# Patient Record
Sex: Female | Born: 1945 | Race: White | Hispanic: No | Smoking: Former smoker
Health system: Southern US, Community
[De-identification: ages and names within clinical notes are randomized; demographics above are authoritative.]

## PROBLEM LIST (undated history)

## (undated) DIAGNOSIS — E119 Type 2 diabetes mellitus without complications: Secondary | ICD-10-CM

## (undated) DIAGNOSIS — E785 Hyperlipidemia, unspecified: Secondary | ICD-10-CM

## (undated) DIAGNOSIS — I2510 Atherosclerotic heart disease of native coronary artery without angina pectoris: Secondary | ICD-10-CM

## (undated) DIAGNOSIS — E079 Disorder of thyroid, unspecified: Secondary | ICD-10-CM

## (undated) HISTORY — PX: TONSILLECTOMY: SUR1361

## (undated) HISTORY — PX: APPENDECTOMY: SHX54

## (undated) HISTORY — PX: ABDOMINAL HYSTERECTOMY: SHX81

## (undated) HISTORY — DX: Atherosclerotic heart disease of native coronary artery without angina pectoris: I25.10

---

## 1991-04-02 DIAGNOSIS — K859 Acute pancreatitis without necrosis or infection, unspecified: Secondary | ICD-10-CM | POA: Insufficient documentation

## 1998-09-02 DIAGNOSIS — D126 Benign neoplasm of colon, unspecified: Secondary | ICD-10-CM | POA: Insufficient documentation

## 2001-06-30 DIAGNOSIS — B029 Zoster without complications: Secondary | ICD-10-CM | POA: Insufficient documentation

## 2006-10-13 DIAGNOSIS — M948X9 Other specified disorders of cartilage, unspecified sites: Secondary | ICD-10-CM | POA: Insufficient documentation

## 2006-10-20 DIAGNOSIS — E559 Vitamin D deficiency, unspecified: Secondary | ICD-10-CM | POA: Diagnosis present

## 2007-10-03 DIAGNOSIS — E1169 Type 2 diabetes mellitus with other specified complication: Secondary | ICD-10-CM | POA: Insufficient documentation

## 2007-10-03 DIAGNOSIS — E781 Pure hyperglyceridemia: Secondary | ICD-10-CM | POA: Insufficient documentation

## 2013-06-08 ENCOUNTER — Encounter (INDEPENDENT_AMBULATORY_CARE_PROVIDER_SITE_OTHER): Payer: Commercial Managed Care - HMO | Admitting: Ophthalmology

## 2013-06-27 ENCOUNTER — Encounter (INDEPENDENT_AMBULATORY_CARE_PROVIDER_SITE_OTHER): Payer: Commercial Managed Care - HMO | Admitting: Ophthalmology

## 2013-06-27 ENCOUNTER — Ambulatory Visit: Payer: Commercial Managed Care - HMO | Admitting: Nurse Practitioner

## 2013-06-27 DIAGNOSIS — E11319 Type 2 diabetes mellitus with unspecified diabetic retinopathy without macular edema: Secondary | ICD-10-CM

## 2013-06-27 DIAGNOSIS — E11311 Type 2 diabetes mellitus with unspecified diabetic retinopathy with macular edema: Secondary | ICD-10-CM

## 2013-06-27 DIAGNOSIS — E1139 Type 2 diabetes mellitus with other diabetic ophthalmic complication: Secondary | ICD-10-CM

## 2013-06-27 DIAGNOSIS — H43819 Vitreous degeneration, unspecified eye: Secondary | ICD-10-CM

## 2013-06-27 DIAGNOSIS — E1165 Type 2 diabetes mellitus with hyperglycemia: Secondary | ICD-10-CM

## 2013-08-01 ENCOUNTER — Encounter (INDEPENDENT_AMBULATORY_CARE_PROVIDER_SITE_OTHER): Payer: Medicare HMO | Admitting: Ophthalmology

## 2013-08-01 DIAGNOSIS — E11311 Type 2 diabetes mellitus with unspecified diabetic retinopathy with macular edema: Secondary | ICD-10-CM

## 2013-08-01 DIAGNOSIS — E11319 Type 2 diabetes mellitus with unspecified diabetic retinopathy without macular edema: Secondary | ICD-10-CM

## 2013-08-01 DIAGNOSIS — E1165 Type 2 diabetes mellitus with hyperglycemia: Secondary | ICD-10-CM

## 2013-08-01 DIAGNOSIS — E1139 Type 2 diabetes mellitus with other diabetic ophthalmic complication: Secondary | ICD-10-CM

## 2013-08-01 DIAGNOSIS — H43819 Vitreous degeneration, unspecified eye: Secondary | ICD-10-CM

## 2013-08-09 ENCOUNTER — Other Ambulatory Visit (INDEPENDENT_AMBULATORY_CARE_PROVIDER_SITE_OTHER): Payer: Medicare HMO | Admitting: Ophthalmology

## 2013-08-09 DIAGNOSIS — H3581 Retinal edema: Secondary | ICD-10-CM

## 2013-08-09 DIAGNOSIS — E1139 Type 2 diabetes mellitus with other diabetic ophthalmic complication: Secondary | ICD-10-CM

## 2013-08-09 DIAGNOSIS — E1165 Type 2 diabetes mellitus with hyperglycemia: Secondary | ICD-10-CM

## 2013-08-22 ENCOUNTER — Encounter (INDEPENDENT_AMBULATORY_CARE_PROVIDER_SITE_OTHER): Payer: Medicare HMO | Admitting: Ophthalmology

## 2013-08-22 DIAGNOSIS — E1165 Type 2 diabetes mellitus with hyperglycemia: Secondary | ICD-10-CM

## 2013-08-22 DIAGNOSIS — H3581 Retinal edema: Secondary | ICD-10-CM

## 2013-08-22 DIAGNOSIS — E1139 Type 2 diabetes mellitus with other diabetic ophthalmic complication: Secondary | ICD-10-CM

## 2013-12-24 ENCOUNTER — Ambulatory Visit (INDEPENDENT_AMBULATORY_CARE_PROVIDER_SITE_OTHER): Payer: Medicare HMO | Admitting: Ophthalmology

## 2013-12-24 DIAGNOSIS — E11319 Type 2 diabetes mellitus with unspecified diabetic retinopathy without macular edema: Secondary | ICD-10-CM

## 2013-12-24 DIAGNOSIS — H43813 Vitreous degeneration, bilateral: Secondary | ICD-10-CM

## 2013-12-24 DIAGNOSIS — E11339 Type 2 diabetes mellitus with moderate nonproliferative diabetic retinopathy without macular edema: Secondary | ICD-10-CM

## 2014-03-21 DIAGNOSIS — N951 Menopausal and female climacteric states: Secondary | ICD-10-CM | POA: Insufficient documentation

## 2014-06-24 ENCOUNTER — Ambulatory Visit (INDEPENDENT_AMBULATORY_CARE_PROVIDER_SITE_OTHER): Payer: Medicare HMO | Admitting: Ophthalmology

## 2016-04-28 DIAGNOSIS — E039 Hypothyroidism, unspecified: Secondary | ICD-10-CM | POA: Diagnosis present

## 2016-07-12 DIAGNOSIS — E1169 Type 2 diabetes mellitus with other specified complication: Secondary | ICD-10-CM | POA: Insufficient documentation

## 2018-10-12 ENCOUNTER — Encounter (INDEPENDENT_AMBULATORY_CARE_PROVIDER_SITE_OTHER): Payer: Medicare HMO | Admitting: Ophthalmology

## 2018-11-07 ENCOUNTER — Encounter (INDEPENDENT_AMBULATORY_CARE_PROVIDER_SITE_OTHER): Payer: Medicare HMO | Admitting: Ophthalmology

## 2019-08-27 DIAGNOSIS — E1159 Type 2 diabetes mellitus with other circulatory complications: Secondary | ICD-10-CM

## 2019-08-27 DIAGNOSIS — I10 Essential (primary) hypertension: Secondary | ICD-10-CM

## 2019-08-27 DIAGNOSIS — N1831 Chronic kidney disease, stage 3a: Secondary | ICD-10-CM | POA: Insufficient documentation

## 2020-05-12 ENCOUNTER — Emergency Department (HOSPITAL_COMMUNITY): Payer: Medicare HMO

## 2020-05-12 ENCOUNTER — Inpatient Hospital Stay (HOSPITAL_COMMUNITY)
Admission: EM | Admit: 2020-05-12 | Discharge: 2020-05-23 | DRG: 234 | Disposition: A | Discharge status: Skilled Nursing Facility | Payer: Medicare HMO | Attending: Cardiothoracic Surgery | Admitting: Cardiothoracic Surgery

## 2020-05-12 ENCOUNTER — Encounter (HOSPITAL_COMMUNITY): Payer: Self-pay | Admitting: Emergency Medicine

## 2020-05-12 ENCOUNTER — Other Ambulatory Visit: Payer: Self-pay

## 2020-05-12 DIAGNOSIS — I5032 Chronic diastolic (congestive) heart failure: Secondary | ICD-10-CM | POA: Diagnosis present

## 2020-05-12 DIAGNOSIS — Z8249 Family history of ischemic heart disease and other diseases of the circulatory system: Secondary | ICD-10-CM

## 2020-05-12 DIAGNOSIS — E1165 Type 2 diabetes mellitus with hyperglycemia: Secondary | ICD-10-CM | POA: Diagnosis present

## 2020-05-12 DIAGNOSIS — Z79890 Hormone replacement therapy (postmenopausal): Secondary | ICD-10-CM

## 2020-05-12 DIAGNOSIS — Z09 Encounter for follow-up examination after completed treatment for conditions other than malignant neoplasm: Secondary | ICD-10-CM

## 2020-05-12 DIAGNOSIS — Z79899 Other long term (current) drug therapy: Secondary | ICD-10-CM

## 2020-05-12 DIAGNOSIS — Z951 Presence of aortocoronary bypass graft: Secondary | ICD-10-CM

## 2020-05-12 DIAGNOSIS — E559 Vitamin D deficiency, unspecified: Secondary | ICD-10-CM | POA: Diagnosis present

## 2020-05-12 DIAGNOSIS — Z87891 Personal history of nicotine dependence: Secondary | ICD-10-CM

## 2020-05-12 DIAGNOSIS — R0602 Shortness of breath: Secondary | ICD-10-CM

## 2020-05-12 DIAGNOSIS — I25110 Atherosclerotic heart disease of native coronary artery with unstable angina pectoris: Secondary | ICD-10-CM | POA: Diagnosis present

## 2020-05-12 DIAGNOSIS — Z20822 Contact with and (suspected) exposure to covid-19: Secondary | ICD-10-CM | POA: Diagnosis present

## 2020-05-12 DIAGNOSIS — Z9889 Other specified postprocedural states: Secondary | ICD-10-CM

## 2020-05-12 DIAGNOSIS — I214 Non-ST elevation (NSTEMI) myocardial infarction: Principal | ICD-10-CM | POA: Diagnosis present

## 2020-05-12 DIAGNOSIS — E785 Hyperlipidemia, unspecified: Secondary | ICD-10-CM | POA: Diagnosis present

## 2020-05-12 DIAGNOSIS — Z7984 Long term (current) use of oral hypoglycemic drugs: Secondary | ICD-10-CM

## 2020-05-12 DIAGNOSIS — K5900 Constipation, unspecified: Secondary | ICD-10-CM | POA: Diagnosis not present

## 2020-05-12 DIAGNOSIS — J918 Pleural effusion in other conditions classified elsewhere: Secondary | ICD-10-CM | POA: Diagnosis not present

## 2020-05-12 DIAGNOSIS — E1159 Type 2 diabetes mellitus with other circulatory complications: Secondary | ICD-10-CM

## 2020-05-12 DIAGNOSIS — I209 Angina pectoris, unspecified: Secondary | ICD-10-CM | POA: Diagnosis present

## 2020-05-12 DIAGNOSIS — J90 Pleural effusion, not elsewhere classified: Secondary | ICD-10-CM

## 2020-05-12 DIAGNOSIS — J9811 Atelectasis: Secondary | ICD-10-CM | POA: Diagnosis not present

## 2020-05-12 DIAGNOSIS — I5A Non-ischemic myocardial injury (non-traumatic): Secondary | ICD-10-CM

## 2020-05-12 DIAGNOSIS — R079 Chest pain, unspecified: Secondary | ICD-10-CM

## 2020-05-12 DIAGNOSIS — E876 Hypokalemia: Secondary | ICD-10-CM | POA: Diagnosis not present

## 2020-05-12 DIAGNOSIS — I10 Essential (primary) hypertension: Secondary | ICD-10-CM

## 2020-05-12 DIAGNOSIS — E039 Hypothyroidism, unspecified: Secondary | ICD-10-CM | POA: Diagnosis present

## 2020-05-12 DIAGNOSIS — Z794 Long term (current) use of insulin: Secondary | ICD-10-CM

## 2020-05-12 DIAGNOSIS — E538 Deficiency of other specified B group vitamins: Secondary | ICD-10-CM | POA: Diagnosis present

## 2020-05-12 DIAGNOSIS — D62 Acute posthemorrhagic anemia: Secondary | ICD-10-CM | POA: Diagnosis not present

## 2020-05-12 DIAGNOSIS — E7800 Pure hypercholesterolemia, unspecified: Secondary | ICD-10-CM | POA: Diagnosis present

## 2020-05-12 DIAGNOSIS — F05 Delirium due to known physiological condition: Secondary | ICD-10-CM | POA: Diagnosis not present

## 2020-05-12 DIAGNOSIS — Z7982 Long term (current) use of aspirin: Secondary | ICD-10-CM

## 2020-05-12 DIAGNOSIS — R778 Other specified abnormalities of plasma proteins: Secondary | ICD-10-CM

## 2020-05-12 DIAGNOSIS — Z888 Allergy status to other drugs, medicaments and biological substances status: Secondary | ICD-10-CM

## 2020-05-12 DIAGNOSIS — R7989 Other specified abnormal findings of blood chemistry: Secondary | ICD-10-CM

## 2020-05-12 DIAGNOSIS — Z6829 Body mass index (BMI) 29.0-29.9, adult: Secondary | ICD-10-CM

## 2020-05-12 DIAGNOSIS — E669 Obesity, unspecified: Secondary | ICD-10-CM | POA: Diagnosis present

## 2020-05-12 DIAGNOSIS — I110 Hypertensive heart disease with heart failure: Secondary | ICD-10-CM | POA: Diagnosis present

## 2020-05-12 HISTORY — DX: Type 2 diabetes mellitus without complications: E11.9

## 2020-05-12 HISTORY — DX: Hyperlipidemia, unspecified: E78.5

## 2020-05-12 HISTORY — DX: Disorder of thyroid, unspecified: E07.9

## 2020-05-12 LAB — CBC
HCT: 38.5 % (ref 36.0–46.0)
Hemoglobin: 12.4 g/dL (ref 12.0–15.0)
MCH: 29.7 pg (ref 26.0–34.0)
MCHC: 32.2 g/dL (ref 30.0–36.0)
MCV: 92.3 fL (ref 80.0–100.0)
Platelets: 338 10*3/uL (ref 150–400)
RBC: 4.17 MIL/uL (ref 3.87–5.11)
RDW: 13.3 % (ref 11.5–15.5)
WBC: 9.3 10*3/uL (ref 4.0–10.5)
nRBC: 0 % (ref 0.0–0.2)

## 2020-05-12 LAB — BASIC METABOLIC PANEL
Anion gap: 11 (ref 5–15)
BUN: 24 mg/dL — ABNORMAL HIGH (ref 8–23)
CO2: 23 mmol/L (ref 22–32)
Calcium: 9.8 mg/dL (ref 8.9–10.3)
Chloride: 105 mmol/L (ref 98–111)
Creatinine, Ser: 1.13 mg/dL — ABNORMAL HIGH (ref 0.44–1.00)
GFR, Estimated: 51 mL/min — ABNORMAL LOW (ref >60)
Glucose, Bld: 150 mg/dL — ABNORMAL HIGH (ref 70–99)
Potassium: 4.1 mmol/L (ref 3.5–5.1)
Sodium: 139 mmol/L (ref 135–145)

## 2020-05-12 LAB — TROPONIN I (HIGH SENSITIVITY)
Troponin I (High Sensitivity): 41 ng/L — ABNORMAL HIGH (ref <18)
Troponin I (High Sensitivity): 33 ng/L — ABNORMAL HIGH (ref <18)

## 2020-05-12 LAB — CBG MONITORING, ED: Glucose-Capillary: 140 mg/dL — ABNORMAL HIGH (ref 70–99)

## 2020-05-12 MED ORDER — ASPIRIN 81 MG PO CHEW
324.0000 mg | CHEWABLE_TABLET | Freq: Once | ORAL | Status: AC
  Administered 2020-05-12: 324 mg via ORAL
  Filled 2020-05-12: qty 4

## 2020-05-12 MED ORDER — NITROGLYCERIN 2 % TD OINT
1.0000 [in_us] | TOPICAL_OINTMENT | Freq: Once | TRANSDERMAL | Status: DC
  Filled 2020-05-12: qty 1

## 2020-05-12 NOTE — ED Notes (Signed)
Pt reports pain has improved, she is unsure if that is from the ASA she took. However this is similar to the CP she's been having for the past week as it's intermittent and will spontaneously resolve.

## 2020-05-12 NOTE — ED Triage Notes (Signed)
[facility] - pt comes from home with c/o burning sensation in chest. Pt states that this has been going on x 1 week, states that it is worse today. No nitro given, pt took 162mg  of ASA at home

## 2020-05-12 NOTE — ED Provider Notes (Signed)
[facility] EMERGENCY DEPARTMENT Provider Note   CSN: [id number] Arrival date & time: 05/12/20  1616     History Chief Complaint  Patient presents with  . Chest Pain    Tiffany White is a 75 y.o. female.  HPI  HPI: A 75 year old patient with a history of treated diabetes and hypercholesterolemia presents for evaluation of chest pain. Initial onset of pain was approximately 1-3 hours ago. The patient's chest pain is not worse with exertion. The patient's chest pain is middle- or left-sided, is not well-localized, is not described as heaviness/pressure/tightness, is not sharp and does radiate to the arms/jaw/neck. The patient does not complain of nausea and denies diaphoresis. The patient has no history of stroke, has no history of peripheral artery disease, has not smoked in the past 90 days, has no relevant family history of coronary artery disease (first degree relative at less than age 83), is not hypertensive and does not have an elevated BMI (>=30).   Burning chest pain over the past few weeks intermittently.  She also has exertional shortness of breath and reports that the burning chest pain has become more intense.  She finally decided to come to the ER.  Currently pain-free.  Past Medical History:  Diagnosis Date  . Diabetes mellitus without complication ([place])   . Thyroid disease     There are no problems to display for this patient.   Past Surgical History:  Procedure Laterality Date  . ABDOMINAL HYSTERECTOMY    . APPENDECTOMY    . TONSILLECTOMY       OB History   No obstetric history on file.     No family history on file.     Home Medications Prior to Admission medications   Medication Sig Start Date End Date Taking? Authorizing Provider  aspirin EC 325 MG tablet Take 650 mg by mouth daily.   Yes [provider]  Cholecalciferol 25 MCG (1000 UT) capsule Take 1,000 Units by mouth daily.   Yes [provider]  cyanocobalamin 1000 MCG  tablet Take 1,000 mcg by mouth daily. 05/09/19  Yes [provider]  insulin aspart protamine - aspart (NOVOLOG MIX 70/30 FLEXPEN) (70-30) 100 UNIT/ML FlexPen Inject 18 Units into the skin 2 (two) times daily with a meal. 05/09/19  Yes [provider]  levothyroxine (SYNTHROID) 75 MCG tablet Take 75 mcg by mouth daily before breakfast.   Yes [provider]  metFORMIN (GLUCOPHAGE) 1000 MG tablet Take 1 tablet by mouth 2 (two) times daily. 01/22/20  Yes [provider]  Omega-3 Fatty Acids (FISH OIL) 1000 MG CAPS Take 1 capsule by mouth daily. 10/03/07  Yes [provider]    Allergies    Patient has no allergy information on record.  Review of Systems   Review of Systems  Constitutional: Positive for activity change.  Respiratory: Positive for shortness of breath.   Cardiovascular: Positive for chest pain.  Gastrointestinal: Negative for nausea.  Neurological: Negative for dizziness.  Hematological: Does not bruise/bleed easily.  All other systems reviewed and are negative.   Physical Exam Updated Vital Signs BP (!) 149/59   Pulse 69   Temp 98 F (36.7 C) (Oral)   Resp 18   Ht 5' 2.5" (1.588 m)   Wt 66.7 kg   SpO2 99%   BMI 26.46 kg/m   Physical Exam Vitals and nursing note reviewed.  Constitutional:      Appearance: She is well-developed.  HENT:     Head: Normocephalic  and atraumatic.  Cardiovascular:     Rate and Rhythm: Normal rate.  Pulmonary:     Effort: Pulmonary effort is normal.     Breath sounds: Normal breath sounds.  Abdominal:     General: Bowel sounds are normal.  Musculoskeletal:     Cervical back: Normal range of motion and neck supple.  Skin:    General: Skin is warm and dry.  Neurological:     Mental Status: She is alert and oriented to person, place, and time.     ED Results / Procedures / Treatments   Labs (all labs ordered are listed, but only abnormal results are displayed) Labs Reviewed  BASIC  METABOLIC PANEL - Abnormal; Notable for the following components:      Result Value   Glucose, Bld 150 (*)    BUN 24 (*)    Creatinine, Ser 1.13 (*)    GFR, Estimated 51 (*)    All other components within normal limits  CBG MONITORING, ED - Abnormal; Notable for the following components:   Glucose-Capillary 140 (*)    All other components within normal limits  TROPONIN I (HIGH SENSITIVITY) - Abnormal; Notable for the following components:   Troponin I (High Sensitivity) 41 (*)    All other components within normal limits  TROPONIN I (HIGH SENSITIVITY) - Abnormal; Notable for the following components:   Troponin I (High Sensitivity) 33 (*)    All other components within normal limits  CBC    EKG EKG Interpretation  Date/Time:  Monday May 12 2020 16:35:50 EDT Ventricular Rate:  80 PR Interval:  144 QRS Duration: 74 QT Interval:  364 QTC Calculation: 419 R Axis:   -15 Text Interpretation: Normal sinus rhythm Nonspecific ST and T wave abnormality Abnormal ECG No acute changes No old tracing to compare Confirmed by Varney Biles [phone]) on 05/12/2020 5:27:24 PM   Radiology DG Chest 2 View  Result Date: 05/12/2020 CLINICAL DATA:  chest pain EXAM: CHEST - 2 VIEW COMPARISON:  None. FINDINGS: Heart is normal in size. Normal mediastinal contours. Aortic atherosclerosis. There is diffuse interstitial coarsening. No focal airspace disease, pleural effusion, or pneumothorax. No acute osseous abnormalities are seen. IMPRESSION: Diffuse interstitial coarsening, nonspecific but likely chronic. Recommend correlation for smoking history. Electronically Signed   By: Keith Rake M.D.   On: 05/12/2020 20:08    Procedures .Critical Care Performed by: Varney Biles, MD Authorized by: Varney Biles, MD   Critical care provider statement:    Critical care time (minutes):  38   Critical care was necessary to treat or prevent imminent or life-threatening deterioration of the following  conditions:  Cardiac failure   Critical care was time spent personally by me on the following activities:  Discussions with consultants, evaluation of patient's response to treatment, examination of patient, ordering and performing treatments and interventions, ordering and review of laboratory studies, ordering and review of radiographic studies, pulse oximetry, re-evaluation of patient's condition, obtaining history from patient or surrogate and review of old charts     Medications Ordered in ED Medications  nitroGLYCERIN (NITROGLYN) 2 % ointment 1 inch (1 inch Topical Patient Refused/Not Given 05/12/20 2028)  aspirin chewable tablet 324 mg (324 mg Oral Given 05/12/20 2024)    ED Course  I have reviewed the triage vital signs and the nursing notes.  Pertinent labs & imaging results that were available during my care of the patient were reviewed by me and considered in my medical decision making (see chart for  details).    MDM Rules/Calculators/A&P HEAR Score: 4                        Pt has burning chest pain, midsternal and exertional shortness of breath. Pain is worse with exertion. Appears that the pain has gotten wose recently.  EKG has nonspecific, non acute changes. Currently pain free, but appears to be unstable angina versus angina.  Initial HST and is elevated.  We ordered a repeat, it is slightly lower.  We will treat her as NSTEMI.  Patient will be admitted to the hospital as the history was concerning for ACS. Heparin started.  No risk factors for PE, dissection.  Pain does not appear to be similar to GERD.   11:20 PM    Results from the ER workup discussed with the patient face to face and all questions answered to the best of my ability.    Final Clinical Impression(s) / ED Diagnoses Final diagnoses:  Elevated troponin  Myocardial injury    Rx / DC Orders ED Discharge Orders    None       Varney Biles, MD 05/12/20 2321

## 2020-05-12 NOTE — ED Notes (Signed)
Assisted pt up to bathroom. Back to stretcher with steady gait. Updated on POC and she expressed understanding. No additional needs voiced at this time.

## 2020-05-13 ENCOUNTER — Other Ambulatory Visit (HOSPITAL_COMMUNITY): Payer: Self-pay | Admitting: *Deleted

## 2020-05-13 ENCOUNTER — Encounter (HOSPITAL_COMMUNITY): Payer: Self-pay | Admitting: Internal Medicine

## 2020-05-13 ENCOUNTER — Observation Stay (HOSPITAL_BASED_OUTPATIENT_CLINIC_OR_DEPARTMENT_OTHER): Payer: Medicare HMO

## 2020-05-13 DIAGNOSIS — R079 Chest pain, unspecified: Secondary | ICD-10-CM | POA: Diagnosis present

## 2020-05-13 DIAGNOSIS — I351 Nonrheumatic aortic (valve) insufficiency: Secondary | ICD-10-CM

## 2020-05-13 DIAGNOSIS — R778 Other specified abnormalities of plasma proteins: Secondary | ICD-10-CM | POA: Diagnosis not present

## 2020-05-13 DIAGNOSIS — I10 Essential (primary) hypertension: Secondary | ICD-10-CM | POA: Diagnosis not present

## 2020-05-13 DIAGNOSIS — E1169 Type 2 diabetes mellitus with other specified complication: Secondary | ICD-10-CM

## 2020-05-13 DIAGNOSIS — E559 Vitamin D deficiency, unspecified: Secondary | ICD-10-CM

## 2020-05-13 DIAGNOSIS — E119 Type 2 diabetes mellitus without complications: Secondary | ICD-10-CM | POA: Insufficient documentation

## 2020-05-13 DIAGNOSIS — I200 Unstable angina: Secondary | ICD-10-CM | POA: Diagnosis not present

## 2020-05-13 DIAGNOSIS — E538 Deficiency of other specified B group vitamins: Secondary | ICD-10-CM

## 2020-05-13 DIAGNOSIS — E785 Hyperlipidemia, unspecified: Secondary | ICD-10-CM | POA: Diagnosis not present

## 2020-05-13 DIAGNOSIS — E039 Hypothyroidism, unspecified: Secondary | ICD-10-CM

## 2020-05-13 DIAGNOSIS — E1165 Type 2 diabetes mellitus with hyperglycemia: Secondary | ICD-10-CM | POA: Diagnosis not present

## 2020-05-13 DIAGNOSIS — R0789 Other chest pain: Secondary | ICD-10-CM | POA: Diagnosis not present

## 2020-05-13 DIAGNOSIS — R7989 Other specified abnormal findings of blood chemistry: Secondary | ICD-10-CM

## 2020-05-13 LAB — HEMOGLOBIN A1C
Hgb A1c MFr Bld: 7.8 % — ABNORMAL HIGH (ref 4.8–5.6)
Mean Plasma Glucose: 177.16 mg/dL

## 2020-05-13 LAB — MAGNESIUM: Magnesium: 2 mg/dL (ref 1.7–2.4)

## 2020-05-13 LAB — CBC
HCT: 36.5 % (ref 36.0–46.0)
Hemoglobin: 11.6 g/dL — ABNORMAL LOW (ref 12.0–15.0)
MCH: 29.2 pg (ref 26.0–34.0)
MCHC: 31.8 g/dL (ref 30.0–36.0)
MCV: 91.9 fL (ref 80.0–100.0)
Platelets: 315 10*3/uL (ref 150–400)
RBC: 3.97 MIL/uL (ref 3.87–5.11)
RDW: 13.3 % (ref 11.5–15.5)
WBC: 10.5 10*3/uL (ref 4.0–10.5)
nRBC: 0 % (ref 0.0–0.2)

## 2020-05-13 LAB — ECHOCARDIOGRAM COMPLETE
AR max vel: 2.4 cm2
AV Area VTI: 2.5 cm2
AV Area mean vel: 2.1 cm2
AV Mean grad: 3 mmHg
AV Peak grad: 5.3 mmHg
Ao pk vel: 1.16 m/s
Area-P 1/2: 3.08 cm2
Height: 62.5 in
S' Lateral: 2.3 cm
Weight: 2352 oz

## 2020-05-13 LAB — COMPREHENSIVE METABOLIC PANEL
ALT: 16 U/L (ref 0–44)
AST: 16 U/L (ref 15–41)
Albumin: 3.9 g/dL (ref 3.5–5.0)
Alkaline Phosphatase: 68 U/L (ref 38–126)
Anion gap: 10 (ref 5–15)
BUN: 21 mg/dL (ref 8–23)
CO2: 24 mmol/L (ref 22–32)
Calcium: 9.5 mg/dL (ref 8.9–10.3)
Chloride: 106 mmol/L (ref 98–111)
Creatinine, Ser: 1.08 mg/dL — ABNORMAL HIGH (ref 0.44–1.00)
GFR, Estimated: 54 mL/min — ABNORMAL LOW (ref >60)
Glucose, Bld: 129 mg/dL — ABNORMAL HIGH (ref 70–99)
Potassium: 4.1 mmol/L (ref 3.5–5.1)
Sodium: 140 mmol/L (ref 135–145)
Total Bilirubin: 0.6 mg/dL (ref 0.3–1.2)
Total Protein: 7.4 g/dL (ref 6.5–8.1)

## 2020-05-13 LAB — RESP PANEL BY RT-PCR (FLU A&B, COVID) ARPGX2
Influenza A by PCR: NEGATIVE
Influenza B by PCR: NEGATIVE
SARS Coronavirus 2 by RT PCR: NEGATIVE

## 2020-05-13 LAB — TROPONIN I (HIGH SENSITIVITY)
Troponin I (High Sensitivity): 10 ng/L (ref <18)
Troponin I (High Sensitivity): 16 ng/L (ref <18)

## 2020-05-13 LAB — HEPARIN LEVEL (UNFRACTIONATED): Heparin Unfractionated: 0.36 IU/mL (ref 0.30–0.70)

## 2020-05-13 LAB — PHOSPHORUS: Phosphorus: 3.7 mg/dL (ref 2.5–4.6)

## 2020-05-13 LAB — PROTIME-INR
INR: 1 (ref 0.8–1.2)
Prothrombin Time: 13.2 seconds (ref 11.4–15.2)

## 2020-05-13 LAB — GLUCOSE, CAPILLARY
Glucose-Capillary: 231 mg/dL — ABNORMAL HIGH (ref 70–99)
Glucose-Capillary: 144 mg/dL — ABNORMAL HIGH (ref 70–99)
Glucose-Capillary: 123 mg/dL — ABNORMAL HIGH (ref 70–99)
Glucose-Capillary: 234 mg/dL — ABNORMAL HIGH (ref 70–99)

## 2020-05-13 LAB — APTT: aPTT: 28 seconds (ref 24–36)

## 2020-05-13 MED ORDER — LOSARTAN POTASSIUM 25 MG PO TABS
12.5000 mg | ORAL_TABLET | Freq: Every day | ORAL | Status: DC
  Administered 2020-05-14 – 2020-05-16 (×3): 12.5 mg via ORAL
  Filled 2020-05-13 (×3): qty 1

## 2020-05-13 MED ORDER — SODIUM CHLORIDE 0.9% FLUSH: 3.0000 mL | INTRAVENOUS | Status: DC | PRN

## 2020-05-13 MED ORDER — LISINOPRIL 5 MG PO TABS: 2.5000 mg | ORAL_TABLET | Freq: Every day | ORAL | Status: DC

## 2020-05-13 MED ORDER — SODIUM CHLORIDE 0.9 % WEIGHT BASED INFUSION
3.0000 mL/kg/h | INTRAVENOUS | Status: DC
  Administered 2020-05-14: 3 mL/kg/h via INTRAVENOUS

## 2020-05-13 MED ORDER — VITAMIN B-12 1000 MCG PO TABS
1000.0000 ug | ORAL_TABLET | Freq: Every day | ORAL | Status: DC
  Administered 2020-05-13 – 2020-05-16 (×4): 1000 ug via ORAL
  Filled 2020-05-13 (×4): qty 1

## 2020-05-13 MED ORDER — OMEGA-3-ACID ETHYL ESTERS 1 G PO CAPS
1.0000 g | ORAL_CAPSULE | Freq: Every day | ORAL | Status: DC
  Administered 2020-05-13 – 2020-05-16 (×4): 1 g via ORAL
  Filled 2020-05-13 (×4): qty 1

## 2020-05-13 MED ORDER — LEVOTHYROXINE SODIUM 75 MCG PO TABS
75.0000 ug | ORAL_TABLET | Freq: Every day | ORAL | Status: DC
  Administered 2020-05-13 – 2020-05-17 (×5): 75 ug via ORAL
  Filled 2020-05-13 (×5): qty 1

## 2020-05-13 MED ORDER — INSULIN ASPART 100 UNIT/ML ~~LOC~~ SOLN
0.0000 [IU] | Freq: Every day | SUBCUTANEOUS | Status: DC
  Administered 2020-05-13 – 2020-05-16 (×3): 2 [IU] via SUBCUTANEOUS

## 2020-05-13 MED ORDER — ROSUVASTATIN CALCIUM 5 MG PO TABS
10.0000 mg | ORAL_TABLET | Freq: Every day | ORAL | Status: DC
  Administered 2020-05-14 – 2020-05-16 (×3): 10 mg via ORAL
  Filled 2020-05-13 (×3): qty 2

## 2020-05-13 MED ORDER — NITROGLYCERIN 0.4 MG SL SUBL
SUBLINGUAL_TABLET | SUBLINGUAL | Status: AC
  Administered 2020-05-13: 0.4 mg
  Filled 2020-05-13: qty 3

## 2020-05-13 MED ORDER — ASPIRIN EC 81 MG PO TBEC
81.0000 mg | DELAYED_RELEASE_TABLET | Freq: Every day | ORAL | Status: DC
  Administered 2020-05-13 – 2020-05-14 (×2): 81 mg via ORAL
  Filled 2020-05-13 (×2): qty 1

## 2020-05-13 MED ORDER — LOSARTAN POTASSIUM 25 MG PO TABS: 12.5000 mg | ORAL_TABLET | Freq: Every day | ORAL | Status: DC

## 2020-05-13 MED ORDER — METOPROLOL TARTRATE 12.5 MG HALF TABLET
12.5000 mg | ORAL_TABLET | Freq: Two times a day (BID) | ORAL | Status: DC
  Administered 2020-05-13 – 2020-05-16 (×8): 12.5 mg via ORAL
  Filled 2020-05-13 (×8): qty 1

## 2020-05-13 MED ORDER — SODIUM CHLORIDE 0.9% FLUSH: 3.0000 mL | Freq: Two times a day (BID) | INTRAVENOUS | Status: DC

## 2020-05-13 MED ORDER — ENOXAPARIN SODIUM 40 MG/0.4ML ~~LOC~~ SOLN
40.0000 mg | SUBCUTANEOUS | Status: DC
  Administered 2020-05-13: 40 mg via SUBCUTANEOUS
  Filled 2020-05-13: qty 0.4

## 2020-05-13 MED ORDER — SODIUM CHLORIDE 0.9 % WEIGHT BASED INFUSION
1.0000 mL/kg/h | INTRAVENOUS | Status: DC
  Administered 2020-05-14: 1 mL/kg/h via INTRAVENOUS

## 2020-05-13 MED ORDER — ASPIRIN 81 MG PO CHEW
81.0000 mg | CHEWABLE_TABLET | ORAL | Status: AC
  Administered 2020-05-14: 81 mg via ORAL
  Filled 2020-05-13: qty 1

## 2020-05-13 MED ORDER — VITAMIN D 25 MCG (1000 UNIT) PO TABS
1000.0000 [IU] | ORAL_TABLET | Freq: Every day | ORAL | Status: DC
  Administered 2020-05-13 – 2020-05-16 (×4): 1000 [IU] via ORAL
  Filled 2020-05-13 (×4): qty 1

## 2020-05-13 MED ORDER — HEPARIN (PORCINE) 25000 UT/250ML-% IV SOLN
750.0000 [IU]/h | INTRAVENOUS | Status: DC
  Administered 2020-05-13: 750 [IU]/h via INTRAVENOUS
  Filled 2020-05-13: qty 250

## 2020-05-13 MED ORDER — SODIUM CHLORIDE 0.9 % IV SOLN: 250.0000 mL | INTRAVENOUS | Status: DC | PRN

## 2020-05-13 MED ORDER — INSULIN ASPART 100 UNIT/ML ~~LOC~~ SOLN
0.0000 [IU] | Freq: Three times a day (TID) | SUBCUTANEOUS | Status: DC
  Administered 2020-05-13: 3 [IU] via SUBCUTANEOUS
  Administered 2020-05-13: 1 [IU] via SUBCUTANEOUS
  Administered 2020-05-14 – 2020-05-15 (×2): 2 [IU] via SUBCUTANEOUS
  Administered 2020-05-15: 1 [IU] via SUBCUTANEOUS
  Administered 2020-05-15: 2 [IU] via SUBCUTANEOUS
  Administered 2020-05-16: 1 [IU] via SUBCUTANEOUS
  Administered 2020-05-16: 3 [IU] via SUBCUTANEOUS
  Administered 2020-05-16: 2 [IU] via SUBCUTANEOUS

## 2020-05-13 NOTE — Progress Notes (Incomplete)
Attending note  Patient seen and discussed with PA Ahmed Prima, I agree with her documentation. 75 yo female history of HTN, DM2, hypothyroidism presents with progressing exertional chest pain. Midsternal to left sided pain/pressure. Occurring with lower levels of activity since onset.    K 4.1 Cr 1.13 BUN 24 WBC 9.3 Hgb 12.4 Plt 338 HgbA1c 7.8 Trop 41-->33 COVID neg CXR diffuse intersitial coarsening EKG SR, lateral and anterior TWIs no prior to compare   75 yo female with multiple CAD risk factors presents with progressing exertional chest pains concerning for unstable angina. Mild trop trending down, EKG with anterior and lateral TWIs no prior to compare. Echo is pending. Given high pretest probability of obstructive coronary disease would plan for invasive ischemic evaluation with cath. For now medical therapy with ASA, crestor 10. Start hep gtt. Start lopressor 12.5mg  bid, losartan 12.5mg  daily. If no CAD by cath could stop these medications.    Plan for transfer to today to medicine service tele bed, cath tomorrow.    Carlyle Dolly MD

## 2020-05-13 NOTE — Progress Notes (Signed)
CCMD called and verified with Vernie Shanks RN, pt on box 39  Dinner ordered by Maudie Mercury NT, at bedside at 1800

## 2020-05-13 NOTE — Progress Notes (Signed)
[place] for heparin Indication: chest pain/ACS  Allergies  Allergen Reactions  . Statins Other (See Comments)    Severe Body aches     Patient Measurements: Height: 5' 2.5" (158.8 cm) Weight: 66.7 kg (147 lb) IBW/kg (Calculated) : 51.25 Heparin Dosing Weight: 65 kg  Vital Signs: Temp: 98.4 F (36.9 C) (04/12 2014) Temp Source: Oral (04/12 2014) BP: 143/64 (04/12 2014) Pulse Rate: 79 (04/12 2014)  Labs: Recent Labs    05/12/20 1959 05/12/20 2147 05/13/20 0554 05/13/20 2043  HGB 12.4  --  11.6*  --   HCT 38.5  --  36.5  --   PLT 338  --  315  --   APTT  --   --  28  --   LABPROT  --   --  13.2  --   INR  --   --  1.0  --   HEPARINUNFRC  --   --   --  0.36  CREATININE 1.13*  --  1.08*  --   TROPONINIHS 41* 33*  --   --     Estimated Creatinine Clearance: 40.9 mL/min (A) (by C-G formula based on SCr of 1.08 mg/dL (H)).  Assessment: Pharmacy consulted to dose heparin in patient with chest pain/ACS.  Heparin level is therapeutic at 0.36 units/mL.  No bleeding reported.  Goal of Therapy:  Heparin level 0.3-0.7 units/ml Monitor platelets by anticoagulation protocol: Yes   Plan:  Continue heparin gtt at 750 units/hr F/U AM labs  Iniko Robles D. Mina Marble, PharmD, BCPS, [place] 05/13/2020, 9:54 PM

## 2020-05-13 NOTE — Progress Notes (Signed)
2000 Pt reported CP. Described as burning and pressure 5/10. EKG completed and placed on chart MD notified.   2008 - 189/65 pain 5/10 1SL nitro given  2013 - 143/64 pain 0/10

## 2020-05-13 NOTE — Consult Note (Addendum)
Cardiology Consultation:   Patient ID: Darcel Zick MRN: [id number]; DOB: 06-11-1945  Admit date: 05/12/2020 Date of Consult: 05/13/2020  PCP:  Almira Bar, MD   [place]  Cardiologist: New to [facility] - Dr. Harl Bowie  Patient Profile:   Charlene Detter is a 75 y.o. female with a past medical history of HLD, Type 2 DM, Hypothyroidism and family history of CAD who is being seen today for the evaluation of chest pain at the request of Dr. Josephine Cables.  History of Present Illness:   Ms. Bilek presented to [facility] ED on 05/12/2020 for evaluation of chest pain. She reports having worsening dyspnea on exertion for the past year but over the past 3-4 weeks she has experienced burning along her chest with activity. Notices this when carrying the groceries into her home or walking around the store. Symptoms typically resolve with rest. She denies any recent orthopnea, PND or edema. Does report some chest pressure with lying flat and also notes more frequent hiccups but symptoms seem different from her typical reflux. She denies any known personal history of CAD or CHF but reports her father was diagnosed with cardiac issues when he was in his 54's and her brother had coronary stents but was also an alcoholic. She is a former smoker but quit 30+ years ago. No personal alcohol use or recreational drug use.   Initial labs show WBC 9.3, Hgb 12.4, platelets 338, Na+ 139, K+ 4.1 and creatinine 1.13. Hgb A1c at 7.8. Initial Hs Troponin 41 with repeat 33. Mg 2.0. EKG shows NSR, HR 80 with TWI along V1 and V2. No prior tracings available for comparison.   She did have recurrent pain this morning with walking back and forth to the restroom which resolved with rest.   Past Medical History:  Diagnosis Date  . Diabetes mellitus without complication ([place])   . Hyperlipidemia   . Thyroid disease     Past Surgical History:  Procedure Laterality Date  . ABDOMINAL HYSTERECTOMY    .  APPENDECTOMY    . TONSILLECTOMY       Home Medications:  Prior to Admission medications   Medication Sig Start Date End Date Taking? Authorizing Provider  aspirin EC 325 MG tablet Take 650 mg by mouth daily.   Yes [provider]  Cholecalciferol 25 MCG (1000 UT) capsule Take 1,000 Units by mouth daily.   Yes [provider]  cyanocobalamin 1000 MCG tablet Take 1,000 mcg by mouth daily. 05/09/19  Yes [provider]  insulin aspart protamine - aspart (NOVOLOG MIX 70/30 FLEXPEN) (70-30) 100 UNIT/ML FlexPen Inject 18 Units into the skin 2 (two) times daily with a meal. 05/09/19  Yes [provider]  levothyroxine (SYNTHROID) 75 MCG tablet Take 75 mcg by mouth daily before breakfast.   Yes [provider]  metFORMIN (GLUCOPHAGE) 1000 MG tablet Take 1 tablet by mouth 2 (two) times daily. 01/22/20  Yes [provider]  Omega-3 Fatty Acids (FISH OIL) 1000 MG CAPS Take 1 capsule by mouth daily. 10/03/07  Yes [provider]    Inpatient Medications: Scheduled Meds: . aspirin EC  81 mg Oral Daily  . cholecalciferol  1,000 Units Oral Daily  . insulin aspart  0-5 Units Subcutaneous QHS  . insulin aspart  0-9 Units Subcutaneous TID WC  . levothyroxine  75 mcg Oral Q0600  . lisinopril  2.5 mg Oral Daily  . nitroGLYCERIN  1 inch Topical Once  . omega-3 acid ethyl  esters  1 g Oral Daily  . rosuvastatin  10 mg Oral q1800  . cyanocobalamin  1,000 mcg Oral Daily   Continuous Infusions: . heparin     PRN Meds:   Allergies:    Allergies  Allergen Reactions  . Statins Other (See Comments)    Severe Body aches     Social History:   Social History   Socioeconomic History  . Marital status: Married    Spouse name: Not on file  . Number of children: Not on file  . Years of education: Not on file  . Highest education level: Not on file  Occupational History  . Not on file  Tobacco Use  . Smoking status: Former Research scientist (life sciences)  . Smokeless  tobacco: Never Used  Vaping Use  . Vaping Use: Never used  Substance and Sexual Activity  . Alcohol use: Not Currently  . Drug use: Never  . Sexual activity: Not on file  Other Topics Concern  . Not on file  Social History Narrative  . Not on file   Social Determinants of Health   Financial Resource Strain: Not on file  Food Insecurity: Not on file  Transportation Needs: Not on file  Physical Activity: Not on file  Stress: Not on file  Social Connections: Not on file  Intimate Partner Violence: Not on file    Family History:    Family History  Problem Relation Age of Onset  . CAD Father   . CAD Brother   . CAD Paternal Aunt      ROS:  Please see the history of present illness.   All other ROS reviewed and negative.     Physical Exam/Data:   Vitals:   05/13/20 0314 05/13/20 0615 05/13/20 0802 05/13/20 1022  BP: (!) 154/54 (!) 152/65 140/69 (!) 142/58  Pulse: 70 67 77 75  Resp: 18 18 18    Temp: 98 F (36.7 C) 98.1 F (36.7 C) 97.7 F (36.5 C) 98.1 F (36.7 C)  TempSrc:   Oral Oral  SpO2: 97% 95% 96% 99%  Weight:      Height:        Intake/Output Summary (Last 24 hours) at 05/13/2020 1113 Last data filed at 05/13/2020 0900 Gross per 24 hour  Intake 480 ml  Output --  Net 480 ml   Last 3 Weights 05/12/2020  Weight (lbs) 147 lb  Weight (kg) 66.679 kg     Body mass index is 26.46 kg/m.  General:  Well nourished, well developed female appearing in no acute distress. HEENT: normal Lymph: no adenopathy Neck: no JVD Endocrine:  No thryomegaly Vascular: No carotid bruits; FA pulses 2+ bilaterally without bruits  Cardiac:  normal S1, S2; RRR; no murmur.  Lungs:  clear to auscultation bilaterally, no wheezing, rhonchi or rales. Abd: soft, nontender, no hepatomegaly  Ext: no edema Musculoskeletal:  No deformities, BUE and BLE strength normal and equal Skin: warm and dry  Neuro:  CNs 2-12 intact, no focal abnormalities noted Psych:  Normal affect   EKG:   The EKG was personally reviewed and demonstrates: NSR, HR 80 with TWI along V1 and V2. No prior tracings available for comparison.   Telemetry:  Telemetry was personally reviewed and demonstrates: NSR, HR in 70's to 80's.   Relevant CV Studies:  Echocardiogram: Pending  Laboratory Data:  High Sensitivity Troponin:   Recent Labs  Lab 05/12/20 1959 05/12/20 2147  TROPONINIHS 41* 33*     Chemistry Recent Labs  Lab 05/12/20  1959 05/13/20 0554  NA 139 140  K 4.1 4.1  CL 105 106  CO2 23 24  GLUCOSE 150* 129*  BUN 24* 21  CREATININE 1.13* 1.08*  CALCIUM 9.8 9.5  GFRNONAA 51* 54*  ANIONGAP 11 10    Recent Labs  Lab 05/13/20 0554  PROT 7.4  ALBUMIN 3.9  AST 16  ALT 16  ALKPHOS 68  BILITOT 0.6   Hematology Recent Labs  Lab 05/12/20 1959 05/13/20 0554  WBC 9.3 10.5  RBC 4.17 3.97  HGB 12.4 11.6*  HCT 38.5 36.5  MCV 92.3 91.9  MCH 29.7 29.2  MCHC 32.2 31.8  RDW 13.3 13.3  PLT 338 315   BNPNo results for input(s): BNP, PROBNP in the last 168 hours.  DDimer No results for input(s): DDIMER in the last 168 hours.   Radiology/Studies:  DG Chest 2 View  Result Date: 05/12/2020 CLINICAL DATA:  chest pain EXAM: CHEST - 2 VIEW COMPARISON:  None. FINDINGS: Heart is normal in size. Normal mediastinal contours. Aortic atherosclerosis. There is diffuse interstitial coarsening. No focal airspace disease, pleural effusion, or pneumothorax. No acute osseous abnormalities are seen. IMPRESSION: Diffuse interstitial coarsening, nonspecific but likely chronic. Recommend correlation for smoking history. Electronically Signed   By: Keith Rake M.D.   On: 05/12/2020 20:08     Assessment and Plan:   1. Chest Pain concerning for Accelerating Angina - Presents with a 1-year history of worsening dyspnea on exertion and has experienced chest pressure/burning with activity for the past 2-3 weeks. Symptoms now occurring with minimal activity such as walking to the restroom and  improve with activity.  - Initial Hs Troponin 41 with repeat 33. EKG shows NSR, HR 80 with TWI along V1 and V2. No prior tracings available for comparison.  - Will discuss with Dr. Harl Bowie but given her cardiac risk factors (HLD, Type 2 DM, family history of CAD and former tobacco use) and worsening chest pain with activity, would anticipate a cardiac catheterization for definitive evaluation. She did eat breakfast this morning. Continue ASA 81mg  daily. Will add Crestor 10mg  daily. If she has recurrent chest pain, would plan to start Heparin.   2. HLD - Listed in [place] but also listed as being statin intolerant. Will re-challenge with Crestor 10mg  daily. Check FLP with AM labs.   3. Type 2 DM - Hgb A1c at 7.8 this admission. Further management per admitting team.    For questions or updates, please contact [place] Please consult [email or web address] for contact info under    Signed, Erma Heritage, PA-C  05/13/2020 11:13 AM   Attending note  Patient seen and discussed with PA Ahmed Prima, I agree with her documentation. 75 yo female history of HTN, DM2, hypothyroidism presents with progressing exertional chest pain. Midsternal to left sided pain/pressure. Occurring with lower levels of activity since onset.    K 4.1 Cr 1.13 BUN 24 WBC 9.3 Hgb 12.4 Plt 338 HgbA1c 7.8 Trop 41-->33 COVID neg CXR diffuse intersitial coarsening EKG SR, lateral and anterior TWIs no prior to compare   75 yo female with multiple CAD risk factors presents with progressing exertional chest pains concerning for unstable angina. Mild trop trending down, EKG with anterior and lateral TWIs no prior to compare. Echo is pending. Given high pretest probability of obstructive coronary disease would plan for invasive ischemic evaluation with cath. For now medical therapy with ASA, crestor 10. Start hep gtt. Start lopressor 12.5mg  bid, losartan 12.5mg  daily. If no CAD by cath  could stop these medications.    Plan  for transfer to today to medicine service tele bed, cath tomorrow.    Carlyle Dolly MD

## 2020-05-13 NOTE — Progress Notes (Addendum)
    Hospitalist arranging transfer to [facility] and she is on the board for cath tomorrow. Card Master made aware. The patient understands that risks include but are not limited to stroke (1 in 1000), death (1 in 81), kidney failure [usually temporary] (1 in 500), bleeding (1 in 200), allergic reaction [possibly serious] (1 in 200). Will need to be NPO after midnight.   Signed, Erma Heritage, PA-C 05/13/2020, 11:48 AM Pager: [phone]

## 2020-05-13 NOTE — H&P (Signed)
History and Physical  Tiffany White VQQ:[id number] DOB: 01/29/1946 DOA: 05/12/2020  Referring physician: Varney Biles, MD PCP: Almira Bar, MD  Patient coming from: Home  Chief Complaint: Chest pain  HPI: Tiffany White is a 75 y.o. female with medical history significant for hypertension, T2DM, hypothyroidism who presents to the emergency department due to 7-10-day onset of intermittent chest pain which worsened today.  Chest pain was midsternal to left-sided and was described as pressure-like/burning sensation.  Chest pain worsens with exertion, it was of moderate intensity and was associated with shortness of breath.  She denies nausea, vomiting, fever, chills.  ED Course:  In the emergency department, she was hemodynamically stable.  Work-up in the ED showed normal CBC, hyperglycemia, BUN/creatinine 34/1.13.  Influenza A, B and SARS coronavirus 2 was negative. Chest x-ray showed diffuse interstitial coarsening, nonspecific but likely chronic. Aspirin 324 Mg x1 was given, nitroglycerin ointment was placed.  Hospitalist was asked to admit patient for further evaluation and management.  Review of Systems: Constitutional: Negative for chills and fever.  HENT: Negative for ear pain and sore throat.   Eyes: Negative for pain and visual disturbance.  Respiratory: Positive for shortness of breath.  Negative for cough  Cardiovascular: Positive for chest pain and negative for palpitations.  Gastrointestinal: Negative for abdominal pain and vomiting.  Endocrine: Negative for polyphagia and polyuria.  Genitourinary: Negative for decreased urine volume, dysuria, enuresis Musculoskeletal: Negative for arthralgias and back pain.  Skin: Negative for color change and rash.  Allergic/Immunologic: Negative for immunocompromised state.  Neurological: Negative for tremors, syncope, speech difficulty, weakness, light-headedness and headaches.  Hematological: Does not bruise/bleed easily.  All  other systems reviewed and are negative   Past Medical History:  Diagnosis Date  . Diabetes mellitus without complication ([place])   . Thyroid disease    Past Surgical History:  Procedure Laterality Date  . ABDOMINAL HYSTERECTOMY    . APPENDECTOMY    . TONSILLECTOMY      Social History:  reports that she has quit smoking. She has never used smokeless tobacco. She reports previous alcohol use. She reports that she does not use drugs.   Allergies  Allergen Reactions  . Statins Other (See Comments)    Severe Body aches     History reviewed. No pertinent family history.    Prior to Admission medications   Medication Sig Start Date End Date Taking? Authorizing Provider  aspirin EC 325 MG tablet Take 650 mg by mouth daily.   Yes [provider]  Cholecalciferol 25 MCG (1000 UT) capsule Take 1,000 Units by mouth daily.   Yes [provider]  cyanocobalamin 1000 MCG tablet Take 1,000 mcg by mouth daily. 05/09/19  Yes [provider]  insulin aspart protamine - aspart (NOVOLOG MIX 70/30 FLEXPEN) (70-30) 100 UNIT/ML FlexPen Inject 18 Units into the skin 2 (two) times daily with a meal. 05/09/19  Yes [provider]  levothyroxine (SYNTHROID) 75 MCG tablet Take 75 mcg by mouth daily before breakfast.   Yes [provider]  metFORMIN (GLUCOPHAGE) 1000 MG tablet Take 1 tablet by mouth 2 (two) times daily. 01/22/20  Yes [provider]  Omega-3 Fatty Acids (FISH OIL) 1000 MG CAPS Take 1 capsule by mouth daily. 10/03/07  Yes [provider]    Physical Exam: BP (!) 154/54   Pulse 70   Temp 98 F (36.7 C)   Resp 18   Ht 5' 2.5" (1.588 m)   Wt 66.7 kg   SpO2  97%   BMI 26.46 kg/m   . General: 75 y.o. year-old female well developed well nourished in no acute distress.  Alert and oriented x3. Marland Kitchen HEENT: NCAT, EOMI . Neck: Supple, trachea medial . Cardiovascular: Regular rate and rhythm with no rubs or gallops.  No thyromegaly or JVD  noted.  No lower extremity edema. 2/4 pulses in all 4 extremities. Marland Kitchen Respiratory: Clear to auscultation with no wheezes or rales. Good inspiratory effort. . Abdomen: Soft nontender nondistended with normal bowel sounds x4 quadrants. . Muskuloskeletal: No cyanosis, clubbing or edema noted bilaterally . Neuro: CN II-XII intact, strength 5/5 x 4, sensation, reflexes intact . Skin: No ulcerative lesions noted or rashes . Psychiatry: Judgement and insight appear normal. Mood is appropriate for condition and setting          Labs on Admission:  Basic Metabolic Panel: Recent Labs  Lab 05/12/20 1959  NA 139  K 4.1  CL 105  CO2 23  GLUCOSE 150*  BUN 24*  CREATININE 1.13*  CALCIUM 9.8   Liver Function Tests: No results for input(s): AST, ALT, ALKPHOS, BILITOT, PROT, ALBUMIN in the last 168 hours. No results for input(s): LIPASE, AMYLASE in the last 168 hours. No results for input(s): AMMONIA in the last 168 hours. CBC: Recent Labs  Lab 05/12/20 1959  WBC 9.3  HGB 12.4  HCT 38.5  MCV 92.3  PLT 338   Cardiac Enzymes: No results for input(s): CKTOTAL, CKMB, CKMBINDEX, TROPONINI in the last 168 hours.  BNP (last 3 results) No results for input(s): BNP in the last 8760 hours.  ProBNP (last 3 results) No results for input(s): PROBNP in the last 8760 hours.  CBG: Recent Labs  Lab 05/12/20 2026  GLUCAP 140*    Radiological Exams on Admission: DG Chest 2 View  Result Date: 05/12/2020 CLINICAL DATA:  chest pain EXAM: CHEST - 2 VIEW COMPARISON:  None. FINDINGS: Heart is normal in size. Normal mediastinal contours. Aortic atherosclerosis. There is diffuse interstitial coarsening. No focal airspace disease, pleural effusion, or pneumothorax. No acute osseous abnormalities are seen. IMPRESSION: Diffuse interstitial coarsening, nonspecific but likely chronic. Recommend correlation for smoking history. Electronically Signed   By: Keith Rake M.D.   On: 05/12/2020 20:08    EKG:  I independently viewed the EKG done and my findings are as followed: Normal sinus rhythm at a rate of 80 bpm with nonspecific ST and T wave abnormalities   Assessment/Plan Present on Admission: . Chest pain . Hypothyroidism . Vitamin D deficiency  Principal Problem:   Chest pain Active Problems:   Essential hypertension   Hypothyroidism   Vitamin D deficiency   Elevated troponin I level   Hyperglycemia due to diabetes mellitus (HCC)   Vitamin B12 deficiency  Chest pain rule out ACS Elevated troponin Troponin- 41 > 33; troponin already trending down Considering the patient's symptoms have been going on for about 7-10 days, it is possible for patient to have developed myocardial injury and that the troponin is now just trending down. Continue telemetry  EKG personally reviewed showed normal sinus rhythm at a rate of 80 bpm with nonspecific ST and T wave abnormalities Cardiology will be consulted to help decide if Stress test is needed in am Versus other diagnostic modalities.  Continue aspirin, nitroglycerin paste  Hyperglycemia secondary to T2DM Continue ISS and hypoglycemic protocol  Essential hypertension Not on antihypertensive medication per med rec, we shall await updated med rec  Hypothyroidism Continue Synthroid  Hyperlipidemia Continue Lovaza  Vitamin D deficiency Continue cholecalciferol  Vitamin B12 deficiency Continue cyanocobalamin   DVT prophylaxis: Lovenox  Code Status: Full code  Family Communication: None at bedside  Disposition Plan:  Patient is from:                        home Anticipated DC to:                   SNF or family members home Anticipated DC date:               1 day Anticipated DC barriers:           Patient requires inpatient management of chest pain rule out and pending cardiology consult   Consults called: Cardiology  Admission status: Observation    Bernadette Hoit MD [facility] Pager [phone]  If  7PM-7AM, please contact night-coverage [email or web address] Password [facility]  05/13/2020, 4:26 AM

## 2020-05-13 NOTE — Progress Notes (Signed)
ANTICOAGULATION CONSULT NOTE - Initial Consult  Pharmacy Consult for heparin Indication: chest pain/ACS  Allergies  Allergen Reactions  . Statins Other (See Comments)    Severe Body aches     Patient Measurements: Height: 5' 2.5" (158.8 cm) Weight: 66.7 kg (147 lb) IBW/kg (Calculated) : 51.25 Heparin Dosing Weight: 65 kg  Vital Signs: Temp: 98.1 F (36.7 C) (04/12 1022) Temp Source: Oral (04/12 1022) BP: 142/58 (04/12 1022) Pulse Rate: 75 (04/12 1022)  Labs: Recent Labs    05/12/20 1959 05/12/20 2147 05/13/20 0554  HGB 12.4  --  11.6*  HCT 38.5  --  36.5  PLT 338  --  315  APTT  --   --  28  LABPROT  --   --  13.2  INR  --   --  1.0  CREATININE 1.13*  --  1.08*  TROPONINIHS 41* 33*  --     Estimated Creatinine Clearance: 40.9 mL/min (A) (by C-G formula based on SCr of 1.08 mg/dL (H)).   Medical History: Past Medical History:  Diagnosis Date  . Diabetes mellitus without complication ([place])   . Thyroid disease     Medications:  Medications Prior to Admission  Medication Sig Dispense Refill Last Dose  . aspirin EC 325 MG tablet Take 650 mg by mouth daily.   05/12/2020 at Unknown time  . Cholecalciferol 25 MCG (1000 UT) capsule Take 1,000 Units by mouth daily.   05/12/2020 at Unknown time  . cyanocobalamin 1000 MCG tablet Take 1,000 mcg by mouth daily.   05/12/2020 at Unknown time  . insulin aspart protamine - aspart (NOVOLOG MIX 70/30 FLEXPEN) (70-30) 100 UNIT/ML FlexPen Inject 18 Units into the skin 2 (two) times daily with a meal.   05/12/2020 at Unknown time  . levothyroxine (SYNTHROID) 75 MCG tablet Take 75 mcg by mouth daily before breakfast.   05/12/2020 at Unknown time  . metFORMIN (GLUCOPHAGE) 1000 MG tablet Take 1 tablet by mouth 2 (two) times daily.   05/12/2020 at Unknown time  . Omega-3 Fatty Acids (FISH OIL) 1000 MG CAPS Take 1 capsule by mouth daily.   05/12/2020 at Unknown time    Assessment: Pharmacy consulted to dose heparin in patient with chest  pain/ACS.  Patient is not on anticoagulation prior to admission.  She did receive Lovenox 40 mg x 1 dose on 4/12 @ 0851.  Goal of Therapy:  Heparin level 0.3-0.7 units/ml Monitor platelets by anticoagulation protocol: Yes   Plan:  No bolus- received lovenox 40 mg x 1 @ 0851 Start heparin infusion at 750 units/hr Check anti-Xa level in 8 hours and daily while on heparin Continue to monitor H&H and platelets  Margot Ables, PharmD Clinical Pharmacist 05/13/2020 11:08 AM

## 2020-05-13 NOTE — Progress Notes (Signed)
Patient seen and evaluated, chart reviewed, please see EMR for updated orders. Please see full H&P dictated by admitting physician Dr. Josephine Cables for same date of service.    Brief Summary:- 75 y.o. female with medical history significant for hypertension, T2DM, hypothyroidism admitted on 01/20/2021 with exertional chest discomfort -Transfer [facility] on 05/14/2020 at [facility]  A/p 1)Chest Pain--- exertional, accelerating angina Troponin  41>>33, EKG is sinus, non-acute, CXR is nonacute -Lipid panel pending -Cardiology consult appreciated -Plan is  for [facility] on 05/14/2020 at [facility] -Echo pending -Iv Heparin, asa, metoprolol and crestor  2)DM2-A1c 7.8 reflecting uncontrolled DM with hyperglycemia PTA Hold Metformin due to pending contrast study Use Novolog/Humalog Sliding scale insulin with Accu-Cheks/Fingersticks as ordered  3) hypothyroidism--- continue levothyroxine  4)HTN-stable, continue metoprolol, hold losartan due to pending contrast study  5) B12 and vitamin D deficiency--- continue replacement  Total care time 37 minutes --   Patient seen and evaluated, chart reviewed, please see EMR for updated orders. Please see full H&P dictated by admitting physician Dr. Josephine Cables for same date of service.

## 2020-05-13 NOTE — Progress Notes (Signed)
*  PRELIMINARY RESULTS* Echocardiogram 2D Echocardiogram has been performed.  Tiffany White 05/13/2020, 1:58 PM

## 2020-05-14 ENCOUNTER — Inpatient Hospital Stay (HOSPITAL_COMMUNITY)
Admission: EM | Disposition: A | Discharge status: Skilled Nursing Facility | Payer: Self-pay | Source: Home / Self Care | Attending: Cardiothoracic Surgery

## 2020-05-14 DIAGNOSIS — R079 Chest pain, unspecified: Secondary | ICD-10-CM

## 2020-05-14 DIAGNOSIS — E559 Vitamin D deficiency, unspecified: Secondary | ICD-10-CM | POA: Diagnosis present

## 2020-05-14 DIAGNOSIS — Z20822 Contact with and (suspected) exposure to covid-19: Secondary | ICD-10-CM | POA: Diagnosis present

## 2020-05-14 DIAGNOSIS — I25110 Atherosclerotic heart disease of native coronary artery with unstable angina pectoris: Secondary | ICD-10-CM

## 2020-05-14 DIAGNOSIS — E1165 Type 2 diabetes mellitus with hyperglycemia: Secondary | ICD-10-CM | POA: Diagnosis present

## 2020-05-14 DIAGNOSIS — I209 Angina pectoris, unspecified: Secondary | ICD-10-CM | POA: Diagnosis not present

## 2020-05-14 DIAGNOSIS — K5900 Constipation, unspecified: Secondary | ICD-10-CM | POA: Diagnosis not present

## 2020-05-14 DIAGNOSIS — E039 Hypothyroidism, unspecified: Secondary | ICD-10-CM | POA: Diagnosis present

## 2020-05-14 DIAGNOSIS — Z79890 Hormone replacement therapy (postmenopausal): Secondary | ICD-10-CM | POA: Diagnosis not present

## 2020-05-14 DIAGNOSIS — Z6829 Body mass index (BMI) 29.0-29.9, adult: Secondary | ICD-10-CM | POA: Diagnosis not present

## 2020-05-14 DIAGNOSIS — I10 Essential (primary) hypertension: Secondary | ICD-10-CM | POA: Diagnosis not present

## 2020-05-14 DIAGNOSIS — Z8249 Family history of ischemic heart disease and other diseases of the circulatory system: Secondary | ICD-10-CM | POA: Diagnosis not present

## 2020-05-14 DIAGNOSIS — Z01810 Encounter for preprocedural cardiovascular examination: Secondary | ICD-10-CM | POA: Diagnosis not present

## 2020-05-14 DIAGNOSIS — E7800 Pure hypercholesterolemia, unspecified: Secondary | ICD-10-CM | POA: Diagnosis present

## 2020-05-14 DIAGNOSIS — I214 Non-ST elevation (NSTEMI) myocardial infarction: Secondary | ICD-10-CM | POA: Diagnosis present

## 2020-05-14 DIAGNOSIS — Z794 Long term (current) use of insulin: Secondary | ICD-10-CM | POA: Diagnosis not present

## 2020-05-14 DIAGNOSIS — I5032 Chronic diastolic (congestive) heart failure: Secondary | ICD-10-CM | POA: Diagnosis present

## 2020-05-14 DIAGNOSIS — E538 Deficiency of other specified B group vitamins: Secondary | ICD-10-CM | POA: Diagnosis present

## 2020-05-14 DIAGNOSIS — J918 Pleural effusion in other conditions classified elsewhere: Secondary | ICD-10-CM | POA: Diagnosis not present

## 2020-05-14 DIAGNOSIS — I110 Hypertensive heart disease with heart failure: Secondary | ICD-10-CM | POA: Diagnosis present

## 2020-05-14 DIAGNOSIS — E119 Type 2 diabetes mellitus without complications: Secondary | ICD-10-CM | POA: Diagnosis not present

## 2020-05-14 DIAGNOSIS — E785 Hyperlipidemia, unspecified: Secondary | ICD-10-CM

## 2020-05-14 DIAGNOSIS — F05 Delirium due to known physiological condition: Secondary | ICD-10-CM | POA: Diagnosis not present

## 2020-05-14 DIAGNOSIS — Z7984 Long term (current) use of oral hypoglycemic drugs: Secondary | ICD-10-CM | POA: Diagnosis not present

## 2020-05-14 DIAGNOSIS — D62 Acute posthemorrhagic anemia: Secondary | ICD-10-CM | POA: Diagnosis not present

## 2020-05-14 DIAGNOSIS — Z7982 Long term (current) use of aspirin: Secondary | ICD-10-CM | POA: Diagnosis not present

## 2020-05-14 DIAGNOSIS — R778 Other specified abnormalities of plasma proteins: Secondary | ICD-10-CM | POA: Diagnosis not present

## 2020-05-14 DIAGNOSIS — E876 Hypokalemia: Secondary | ICD-10-CM | POA: Diagnosis not present

## 2020-05-14 DIAGNOSIS — J9811 Atelectasis: Secondary | ICD-10-CM | POA: Diagnosis not present

## 2020-05-14 DIAGNOSIS — R0602 Shortness of breath: Secondary | ICD-10-CM | POA: Diagnosis present

## 2020-05-14 DIAGNOSIS — E669 Obesity, unspecified: Secondary | ICD-10-CM | POA: Diagnosis present

## 2020-05-14 HISTORY — PX: LEFT HEART CATH AND CORONARY ANGIOGRAPHY: CATH118249

## 2020-05-14 LAB — GLUCOSE, CAPILLARY
Glucose-Capillary: 155 mg/dL — ABNORMAL HIGH (ref 70–99)
Glucose-Capillary: 114 mg/dL — ABNORMAL HIGH (ref 70–99)
Glucose-Capillary: 119 mg/dL — ABNORMAL HIGH (ref 70–99)
Glucose-Capillary: 159 mg/dL — ABNORMAL HIGH (ref 70–99)

## 2020-05-14 LAB — CBC
HCT: 36.1 % (ref 36.0–46.0)
Hemoglobin: 11.8 g/dL — ABNORMAL LOW (ref 12.0–15.0)
MCH: 29.7 pg (ref 26.0–34.0)
MCHC: 32.7 g/dL (ref 30.0–36.0)
MCV: 90.9 fL (ref 80.0–100.0)
Platelets: 299 10*3/uL (ref 150–400)
RBC: 3.97 MIL/uL (ref 3.87–5.11)
RDW: 13.2 % (ref 11.5–15.5)
WBC: 8 10*3/uL (ref 4.0–10.5)
nRBC: 0 % (ref 0.0–0.2)

## 2020-05-14 LAB — LIPID PANEL
Cholesterol: 188 mg/dL (ref 0–200)
HDL: 36 mg/dL — ABNORMAL LOW (ref >40)
LDL Cholesterol: 106 mg/dL — ABNORMAL HIGH (ref 0–99)
Total CHOL/HDL Ratio: 5.2 RATIO
Triglycerides: 228 mg/dL — ABNORMAL HIGH (ref <150)
VLDL: 46 mg/dL — ABNORMAL HIGH (ref 0–40)

## 2020-05-14 LAB — HEPARIN LEVEL (UNFRACTIONATED): Heparin Unfractionated: 0.19 IU/mL — ABNORMAL LOW (ref 0.30–0.70)

## 2020-05-14 SURGERY — LEFT HEART CATH AND CORONARY ANGIOGRAPHY
Anesthesia: LOCAL

## 2020-05-14 MED ORDER — EZETIMIBE 10 MG PO TABS
10.0000 mg | ORAL_TABLET | Freq: Every day | ORAL | Status: DC
  Administered 2020-05-14 – 2020-05-16 (×3): 10 mg via ORAL
  Filled 2020-05-14 (×3): qty 1

## 2020-05-14 MED ORDER — FENTANYL CITRATE (PF) 100 MCG/2ML IJ SOLN
INTRAMUSCULAR | Status: AC
  Filled 2020-05-14: qty 2

## 2020-05-14 MED ORDER — ASPIRIN 81 MG PO CHEW
81.0000 mg | CHEWABLE_TABLET | Freq: Every day | ORAL | Status: DC
  Administered 2020-05-15 – 2020-05-16 (×2): 81 mg via ORAL
  Filled 2020-05-14 (×2): qty 1

## 2020-05-14 MED ORDER — SODIUM CHLORIDE 0.9% FLUSH
3.0000 mL | Freq: Two times a day (BID) | INTRAVENOUS | Status: DC
  Administered 2020-05-15 – 2020-05-16 (×2): 3 mL via INTRAVENOUS

## 2020-05-14 MED ORDER — FAMOTIDINE IN NACL 20-0.9 MG/50ML-% IV SOLN
INTRAVENOUS | Status: AC
  Filled 2020-05-14: qty 50

## 2020-05-14 MED ORDER — SODIUM CHLORIDE 0.9 % IV SOLN: 250.0000 mL | INTRAVENOUS | Status: DC | PRN

## 2020-05-14 MED ORDER — HEPARIN SODIUM (PORCINE) 1000 UNIT/ML IJ SOLN
INTRAMUSCULAR | Status: DC | PRN
  Administered 2020-05-14: 3500 [IU] via INTRAVENOUS

## 2020-05-14 MED ORDER — SODIUM CHLORIDE 0.9% FLUSH: 3.0000 mL | INTRAVENOUS | Status: DC | PRN

## 2020-05-14 MED ORDER — HEPARIN SODIUM (PORCINE) 1000 UNIT/ML IJ SOLN
INTRAMUSCULAR | Status: AC
  Filled 2020-05-14: qty 1

## 2020-05-14 MED ORDER — VERAPAMIL HCL 2.5 MG/ML IV SOLN
INTRAVENOUS | Status: DC | PRN
  Administered 2020-05-14: 2 mg via INTRA_ARTERIAL

## 2020-05-14 MED ORDER — FAMOTIDINE IN NACL 20-0.9 MG/50ML-% IV SOLN
INTRAVENOUS | Status: AC | PRN
  Administered 2020-05-14: 20 mg via INTRAVENOUS

## 2020-05-14 MED ORDER — VERAPAMIL HCL 2.5 MG/ML IV SOLN
INTRAVENOUS | Status: AC
  Filled 2020-05-14: qty 2

## 2020-05-14 MED ORDER — INSULIN GLARGINE 100 UNIT/ML ~~LOC~~ SOLN
10.0000 [IU] | Freq: Every day | SUBCUTANEOUS | Status: DC
  Administered 2020-05-14 – 2020-05-16 (×3): 10 [IU] via SUBCUTANEOUS
  Filled 2020-05-14 (×4): qty 0.1

## 2020-05-14 MED ORDER — HEPARIN (PORCINE) IN NACL 1000-0.9 UT/500ML-% IV SOLN
INTRAVENOUS | Status: DC | PRN
  Administered 2020-05-14 (×2): 500 mL

## 2020-05-14 MED ORDER — LABETALOL HCL 5 MG/ML IV SOLN: 10.0000 mg | INTRAVENOUS | Status: AC | PRN

## 2020-05-14 MED ORDER — ONDANSETRON HCL 4 MG/2ML IJ SOLN
4.0000 mg | Freq: Four times a day (QID) | INTRAMUSCULAR | Status: DC | PRN
  Filled 2020-05-14: qty 2

## 2020-05-14 MED ORDER — IOHEXOL 350 MG/ML SOLN
INTRAVENOUS | Status: DC | PRN
  Administered 2020-05-14: 80 mL via INTRA_ARTERIAL

## 2020-05-14 MED ORDER — NITROGLYCERIN IN D5W 200-5 MCG/ML-% IV SOLN
0.0000 ug/min | INTRAVENOUS | Status: DC
  Administered 2020-05-15: 35 ug/min via INTRAVENOUS
  Administered 2020-05-15: 25 ug/min via INTRAVENOUS
  Administered 2020-05-15: 30 ug/min via INTRAVENOUS
  Administered 2020-05-15: 20 ug/min via INTRAVENOUS
  Administered 2020-05-15 – 2020-05-17 (×3): 40 ug/min via INTRAVENOUS
  Filled 2020-05-14: qty 250

## 2020-05-14 MED ORDER — NITROGLYCERIN IN D5W 200-5 MCG/ML-% IV SOLN
INTRAVENOUS | Status: AC | PRN
  Administered 2020-05-14: 10 ug/min via INTRAVENOUS

## 2020-05-14 MED ORDER — MIDAZOLAM HCL 2 MG/2ML IJ SOLN
INTRAMUSCULAR | Status: DC | PRN
  Administered 2020-05-14: 2 mg via INTRAVENOUS

## 2020-05-14 MED ORDER — HYDRALAZINE HCL 20 MG/ML IJ SOLN: 10.0000 mg | INTRAMUSCULAR | Status: AC | PRN

## 2020-05-14 MED ORDER — MIDAZOLAM HCL 2 MG/2ML IJ SOLN
INTRAMUSCULAR | Status: AC
  Filled 2020-05-14: qty 2

## 2020-05-14 MED ORDER — VERAPAMIL HCL 2.5 MG/ML IV SOLN
INTRAVENOUS | Status: DC | PRN
  Administered 2020-05-14: 10 mL via INTRA_ARTERIAL

## 2020-05-14 MED ORDER — HEPARIN (PORCINE) IN NACL 1000-0.9 UT/500ML-% IV SOLN
INTRAVENOUS | Status: AC
  Filled 2020-05-14: qty 1000

## 2020-05-14 MED ORDER — SODIUM CHLORIDE 0.9 % IV SOLN: INTRAVENOUS | Status: AC

## 2020-05-14 MED ORDER — HEPARIN (PORCINE) 25000 UT/250ML-% IV SOLN
1100.0000 [IU]/h | INTRAVENOUS | Status: DC
  Administered 2020-05-15: 900 [IU]/h via INTRAVENOUS
  Administered 2020-05-16 – 2020-05-17 (×2): 1100 [IU]/h via INTRAVENOUS
  Filled 2020-05-14 (×3): qty 250

## 2020-05-14 MED ORDER — NITROGLYCERIN IN D5W 200-5 MCG/ML-% IV SOLN
INTRAVENOUS | Status: AC
  Filled 2020-05-14: qty 250

## 2020-05-14 MED ORDER — FENTANYL CITRATE (PF) 100 MCG/2ML IJ SOLN
INTRAMUSCULAR | Status: DC | PRN
  Administered 2020-05-14: 25 ug via INTRAVENOUS

## 2020-05-14 MED ORDER — ACETAMINOPHEN 325 MG PO TABS: 650.0000 mg | ORAL_TABLET | ORAL | Status: DC | PRN

## 2020-05-14 MED ORDER — LIDOCAINE HCL (PF) 1 % IJ SOLN
INTRAMUSCULAR | Status: AC
  Filled 2020-05-14: qty 30

## 2020-05-14 MED ORDER — DIAZEPAM 5 MG PO TABS
5.0000 mg | ORAL_TABLET | Freq: Four times a day (QID) | ORAL | Status: DC | PRN
  Administered 2020-05-15: 5 mg via ORAL
  Filled 2020-05-14: qty 1

## 2020-05-14 SURGICAL SUPPLY — 12 items
CATH INFINITI 5 FR JL3.5 (CATHETERS) ×2 IMPLANT
CATH INFINITI 5 FR JR3.5 (CATHETERS) IMPLANT
CATH OPTITORQUE TIG 4.0 5F (CATHETERS) ×2 IMPLANT
DEVICE RAD COMP TR BAND LRG (VASCULAR PRODUCTS) ×2 IMPLANT
GLIDESHEATH SLEND A-KIT 6F 22G (SHEATH) ×2 IMPLANT
GLIDESHEATH SLEND SS 6F .021 (SHEATH) ×2 IMPLANT
GUIDEWIRE INQWIRE 1.5J.035X260 (WIRE) ×1 IMPLANT
INQWIRE 1.5J .035X260CM (WIRE) ×2
KIT HEART LEFT (KITS) ×2 IMPLANT
PACK CARDIAC CATHETERIZATION (CUSTOM PROCEDURE TRAY) ×2 IMPLANT
TRANSDUCER W/STOPCOCK (MISCELLANEOUS) ×2 IMPLANT
TUBING CIL FLEX 10 FLL-RA (TUBING) ×2 IMPLANT

## 2020-05-14 NOTE — Progress Notes (Addendum)
Progress Note  Patient Name: Tiffany White Date of Encounter: 05/14/2020  Primary Cardiologist: New to [facility] - Dr.Branch  Subjective   Substernal chest pressure and associated burning pain under arms while ambulating to bathroom last night. Relieved with rest. Also given SL nitro. No active CP this morning.   Inpatient Medications    Scheduled Meds: . aspirin EC  81 mg Oral Daily  . cholecalciferol  1,000 Units Oral Daily  . insulin aspart  0-5 Units Subcutaneous QHS  . insulin aspart  0-9 Units Subcutaneous TID WC  . levothyroxine  75 mcg Oral Q0600  . [START ON 05/15/2020] losartan  12.5 mg Oral Daily  . metoprolol tartrate  12.5 mg Oral BID  . nitroGLYCERIN  1 inch Topical Once  . omega-3 acid ethyl esters  1 g Oral Daily  . rosuvastatin  10 mg Oral q1800  . sodium chloride flush  3 mL Intravenous Q12H  . cyanocobalamin  1,000 mcg Oral Daily   Continuous Infusions: . sodium chloride    . sodium chloride 1 mL/kg/hr (05/14/20 0527)  . heparin 750 Units/hr (05/13/20 1324)   PRN Meds: sodium chloride, sodium chloride flush   Vital Signs    Vitals:   05/14/20 0035 05/14/20 0236 05/14/20 0322 05/14/20 0827  BP: (!) 139/50  (!) 158/98 (!) 142/51  Pulse: 66  66 62  Resp: 18  16 15   Temp: 98.4 F (36.9 C)  98.4 F (36.9 C) 98 F (36.7 C)  TempSrc: Oral  Oral Oral  SpO2: 98%  98% 96%  Weight:  66.5 kg    Height:        Intake/Output Summary (Last 24 hours) at 05/14/2020 1007 Last data filed at 05/14/2020 0610 Gross per 24 hour  Intake 321.78 ml  Output 1250 ml  Net -928.22 ml   Filed Weights   05/12/20 1627 05/14/20 0236  Weight: 66.7 kg 66.5 kg    Telemetry    NSR - Personally Reviewed  ECG    No new ECG - Personally Reviewed  Physical Exam   GEN: No acute distress.  Well appearing Neck: No JVD Cardiac: RRR, no murmurs, rubs, or gallops.  Respiratory: Normal work of breathing. No adventitious breath sounds  GI: Soft, nontender, non-distended   MS: No edema; No deformity. Neuro:  Nonfocal , AOx4 Psych: Normal affect , normal behavior  Labs    Chemistry Recent Labs  Lab 05/12/20 1959 05/13/20 0554  NA 139 140  K 4.1 4.1  CL 105 106  CO2 23 24  GLUCOSE 150* 129*  BUN 24* 21  CREATININE 1.13* 1.08*  CALCIUM 9.8 9.5  PROT  --  7.4  ALBUMIN  --  3.9  AST  --  16  ALT  --  16  ALKPHOS  --  68  BILITOT  --  0.6  GFRNONAA 51* 54*  ANIONGAP 11 10     Hematology Recent Labs  Lab 05/12/20 1959 05/13/20 0554 05/14/20 0456  WBC 9.3 10.5 8.0  RBC 4.17 3.97 3.97  HGB 12.4 11.6* 11.8*  HCT 38.5 36.5 36.1  MCV 92.3 91.9 90.9  MCH 29.7 29.2 29.7  MCHC 32.2 31.8 32.7  RDW 13.3 13.3 13.2  PLT 338 315 299    Cardiac EnzymesNo results for input(s): TROPONINI in the last 168 hours. No results for input(s): TROPIPOC in the last 168 hours.   BNPNo results for input(s): BNP, PROBNP in the last 168 hours.   DDimer No results for input(s):  DDIMER in the last 168 hours.   Radiology    DG Chest 2 View  Result Date: 05/12/2020 CLINICAL DATA:  chest pain EXAM: CHEST - 2 VIEW COMPARISON:  None. FINDINGS: Heart is normal in size. Normal mediastinal contours. Aortic atherosclerosis. There is diffuse interstitial coarsening. No focal airspace disease, pleural effusion, or pneumothorax. No acute osseous abnormalities are seen. IMPRESSION: Diffuse interstitial coarsening, nonspecific but likely chronic. Recommend correlation for smoking history. Electronically Signed   By: Keith Rake M.D.   On: 05/12/2020 20:08   ECHOCARDIOGRAM COMPLETE  Result Date: 05/13/2020    ECHOCARDIOGRAM REPORT   Patient Name:   Tiffany White Date of Exam: 05/13/2020 Medical Rec #:  [id number]     Height:       62.5 in Accession #:    [id number]    Weight:       147.0 lb Date of Birth:  [birth year]      BSA:          1.687 m Patient Age:    75 years      BP:           142/58 mmHg Patient Gender: F             HR:           75 bpm. Exam Location:  [facility] Procedure: 2D Echo, Cardiac Doppler and Color Doppler Indications:    Chest Pain R07.9  History:        Patient has no prior history of Echocardiogram examinations.                 Risk Factors:Hypertension, Diabetes and Dyslipidemia. Elevated                 troponin I level, Chronic kidney disease, stage 3a ([place]).  Sonographer:    Alvino Chapel RCS Referring Phys: [phone] COURAGE EMOKPAE IMPRESSIONS  1. Left ventricular ejection fraction, by estimation, is 65 to 70%. The left ventricle has normal function. The left ventricle has no regional wall motion abnormalities. There is mild left ventricular hypertrophy. Left ventricular diastolic parameters are consistent with Grade I diastolic dysfunction (impaired relaxation). Elevated left atrial pressure.  2. Right ventricular systolic function is normal. The right ventricular size is normal.  3. The mitral valve is normal in structure. No evidence of mitral valve regurgitation. No evidence of mitral stenosis.  4. The aortic valve is tricuspid. There is mild calcification of the aortic valve. There is mild thickening of the aortic valve. Aortic valve regurgitation is mild. No aortic stenosis is present.  5. The inferior vena cava is normal in size with greater than 50% respiratory variability, suggesting right atrial pressure of 3 mmHg. FINDINGS  Left Ventricle: Left ventricular ejection fraction, by estimation, is 65 to 70%. The left ventricle has normal function. The left ventricle has no regional wall motion abnormalities. The left ventricular internal cavity size was normal in size. There is  mild left ventricular hypertrophy. Left ventricular diastolic parameters are consistent with Grade I diastolic dysfunction (impaired relaxation). Elevated left atrial pressure. Right Ventricle: The right ventricular size is normal. No increase in right ventricular wall thickness. Right ventricular systolic function is normal. Left Atrium: Left atrial size was normal in  size. Right Atrium: Right atrial size was normal in size. Pericardium: The pericardium was not well visualized. Mitral Valve: The mitral valve is normal in structure. There is mild thickening of the mitral valve leaflet(s). There is mild calcification of the mitral  valve leaflet(s). Mild mitral annular calcification. No evidence of mitral valve regurgitation. No evidence of mitral valve stenosis. Tricuspid Valve: The tricuspid valve is normal in structure. Tricuspid valve regurgitation is not demonstrated. No evidence of tricuspid stenosis. Aortic Valve: The aortic valve is tricuspid. There is mild calcification of the aortic valve. There is mild thickening of the aortic valve. There is mild aortic valve annular calcification. Aortic valve regurgitation is mild. No aortic stenosis is present. Aortic valve mean gradient measures 3.0 mmHg. Aortic valve peak gradient measures 5.3 mmHg. Aortic valve area, by VTI measures 2.50 cm. Pulmonic Valve: The pulmonic valve was not well visualized. Pulmonic valve regurgitation is mild. No evidence of pulmonic stenosis. Aorta: The aortic root is normal in size and structure. Pulmonary Artery: Indeterminant PASP, inadequate TR jet. Venous: The inferior vena cava is normal in size with greater than 50% respiratory variability, suggesting right atrial pressure of 3 mmHg. IAS/Shunts: No atrial level shunt detected by color flow Doppler.  LEFT VENTRICLE PLAX 2D LVIDd:         4.20 cm  Diastology LVIDs:         2.30 cm  LV e' medial:    5.11 cm/s LV PW:         1.10 cm  LV E/e' medial:  15.3 LV IVS:        1.00 cm  LV e' lateral:   4.57 cm/s LVOT diam:     1.80 cm  LV E/e' lateral: 17.1 LV SV:         61 LV SV Index:   36 LVOT Area:     2.54 cm  RIGHT VENTRICLE RV S prime:     14.40 cm/s TAPSE (M-mode): 2.1 cm LEFT ATRIUM             Index       RIGHT ATRIUM           Index LA diam:        2.60 cm 1.54 cm/m  RA Area:     11.00 cm LA Vol (A2C):   35.3 ml 20.92 ml/m RA Volume:    23.70 ml  14.05 ml/m LA Vol (A4C):   37.4 ml 22.17 ml/m LA Biplane Vol: 39.3 ml 23.29 ml/m  AORTIC VALVE AV Area (Vmax):    2.40 cm AV Area (Vmean):   2.10 cm AV Area (VTI):     2.50 cm AV Vmax:           115.54 cm/s AV Vmean:          82.829 cm/s AV VTI:            0.242 m AV Peak Grad:      5.3 mmHg AV Mean Grad:      3.0 mmHg LVOT Vmax:         109.00 cm/s LVOT Vmean:        68.500 cm/s LVOT VTI:          0.238 m LVOT/AV VTI ratio: 0.98  AORTA Ao Root diam: 2.90 cm MITRAL VALVE MV Area (PHT): 3.08 cm     SHUNTS MV Decel Time: 246 msec     Systemic VTI:  0.24 m MV E velocity: 78.10 cm/s   Systemic Diam: 1.80 cm MV A velocity: 111.00 cm/s MV E/A ratio:  0.70 Carlyle Dolly MD Electronically signed by Carlyle Dolly MD Signature Date/Time: 05/13/2020/3:21:19 PM    Final     Cardiac Studies     Patient Profile  75 y.o. female with a past medical history of HLD, type II DM, hypothyroidism, and family history of CAD who is being seen for chest pain.   Assessment & Plan    1. Chest Pain Presents with 1 year history of worsening dyspnea on exertion has experienced chest pressure/burning with activity for the past 2 to 3 weeks. She is now experiencing chest pain with minimal exertion. - Cardiac Risk factors of HLD, T2D, family history of CAD, and hx of tobacco use.  - Patient started on Heparin, ASA, and Crestor at neighboring hospital. Transferred to [facility] on 4/12 with plans for LHC on 4/13. -Last night she had symptoms with minimal exertion walking to restroom, HS troponins 10>16 with no changes on EKG. Pain relieved with rest and also given SL nitro. No active chest pain on exam this morning, patient is at rest. Plan; - RHC today - Continue Heparin - Continue medical management, Asprin, Metoprolol 12.5 BID, and Losartan 12.5  2. HLD ASCVD risk 42.7 %, HDL 36, LDL 106. History of statin intolerance under allergies.  - Continue Crestor 10 mg daily  3. Type 2 Diabetes Mellitus   Hgb A1c on 05/13/2020 7.8. Goal 7 to 8 given age. Holding home Metformin. Blood glucose 144 to 234 on 4/12, required 8 units of Aspart. Morning glucose 114.  - Continue SSI- S and HS scale    For questions or updates, please contact [facility] Please consult [email or web address] for contact info under Cardiology/STEMI.      Signed, Lorene Dy, MD  PGY2 IM 05/14/2020, 10:07 AM    Patient seen and examined.  Agree with above documentation.  On exam, patient is alert and oriented, regular rate and rhythm, no murmurs, lungs CTAB, no LE edema or JVD.  Telemetry shows NSR with rate 60s.  Currently chest pain free.  Plan for LHC today.  Donato Heinz, MD

## 2020-05-14 NOTE — Interval H&P Note (Signed)
Cath Lab Visit (complete for each Cath Lab visit)  Clinical Evaluation Leading to the Procedure:   ACS: No.  Non-ACS:    Anginal Classification: CCS III  Anti-ischemic medical therapy: Minimal Therapy (1 class of medications)  Non-Invasive Test Results: No non-invasive testing performed  Prior CABG: No previous CABG      History and Physical Interval Note:  05/14/2020 3:15 PM  Bre Pecina  has presented today for surgery, with the diagnosis of angina.  The various methods of treatment have been discussed with the patient and family. After consideration of risks, benefits and other options for treatment, the patient has consented to  Procedure(s): LEFT HEART CATH AND CORONARY ANGIOGRAPHY (N/A) as a surgical intervention.  The patient's history has been reviewed, patient examined, no change in status, stable for surgery.  I have reviewed the patient's chart and labs.  Questions were answered to the patient's satisfaction.     Shelva Majestic

## 2020-05-14 NOTE — Progress Notes (Signed)
TCTS consulted for CABG evaluation. °

## 2020-05-14 NOTE — H&P (View-Only) (Signed)
Progress Note  Patient Name: Tiffany White Date of Encounter: 05/14/2020  Primary Cardiologist: New to [facility] - Dr.Branch  Subjective   Substernal chest pressure and associated burning pain under arms while ambulating to bathroom last night. Relieved with rest. Also given SL nitro. No active CP this morning.   Inpatient Medications    Scheduled Meds: . aspirin EC  81 mg Oral Daily  . cholecalciferol  1,000 Units Oral Daily  . insulin aspart  0-5 Units Subcutaneous QHS  . insulin aspart  0-9 Units Subcutaneous TID WC  . levothyroxine  75 mcg Oral Q0600  . [START ON 05/15/2020] losartan  12.5 mg Oral Daily  . metoprolol tartrate  12.5 mg Oral BID  . nitroGLYCERIN  1 inch Topical Once  . omega-3 acid ethyl esters  1 g Oral Daily  . rosuvastatin  10 mg Oral q1800  . sodium chloride flush  3 mL Intravenous Q12H  . cyanocobalamin  1,000 mcg Oral Daily   Continuous Infusions: . sodium chloride    . sodium chloride 1 mL/kg/hr (05/14/20 0527)  . heparin 750 Units/hr (05/13/20 1324)   PRN Meds: sodium chloride, sodium chloride flush   Vital Signs    Vitals:   05/14/20 0035 05/14/20 0236 05/14/20 0322 05/14/20 0827  BP: (!) 139/50  (!) 158/98 (!) 142/51  Pulse: 66  66 62  Resp: 18  16 15   Temp: 98.4 F (36.9 C)  98.4 F (36.9 C) 98 F (36.7 C)  TempSrc: Oral  Oral Oral  SpO2: 98%  98% 96%  Weight:  66.5 kg    Height:        Intake/Output Summary (Last 24 hours) at 05/14/2020 1007 Last data filed at 05/14/2020 0610 Gross per 24 hour  Intake 321.78 ml  Output 1250 ml  Net -928.22 ml   Filed Weights   05/12/20 1627 05/14/20 0236  Weight: 66.7 kg 66.5 kg    Telemetry    NSR - Personally Reviewed  ECG    No new ECG - Personally Reviewed  Physical Exam   GEN: No acute distress.  Well appearing Neck: No JVD Cardiac: RRR, no murmurs, rubs, or gallops.  Respiratory: Normal work of breathing. No adventitious breath sounds  GI: Soft, nontender, non-distended   MS: No edema; No deformity. Neuro:  Nonfocal , AOx4 Psych: Normal affect , normal behavior  Labs    Chemistry Recent Labs  Lab 05/12/20 1959 05/13/20 0554  NA 139 140  K 4.1 4.1  CL 105 106  CO2 23 24  GLUCOSE 150* 129*  BUN 24* 21  CREATININE 1.13* 1.08*  CALCIUM 9.8 9.5  PROT  --  7.4  ALBUMIN  --  3.9  AST  --  16  ALT  --  16  ALKPHOS  --  68  BILITOT  --  0.6  GFRNONAA 51* 54*  ANIONGAP 11 10     Hematology Recent Labs  Lab 05/12/20 1959 05/13/20 0554 05/14/20 0456  WBC 9.3 10.5 8.0  RBC 4.17 3.97 3.97  HGB 12.4 11.6* 11.8*  HCT 38.5 36.5 36.1  MCV 92.3 91.9 90.9  MCH 29.7 29.2 29.7  MCHC 32.2 31.8 32.7  RDW 13.3 13.3 13.2  PLT 338 315 299    Cardiac EnzymesNo results for input(s): TROPONINI in the last 168 hours. No results for input(s): TROPIPOC in the last 168 hours.   BNPNo results for input(s): BNP, PROBNP in the last 168 hours.   DDimer No results for input(s):  DDIMER in the last 168 hours.   Radiology    DG Chest 2 View  Result Date: 05/12/2020 CLINICAL DATA:  chest pain EXAM: CHEST - 2 VIEW COMPARISON:  None. FINDINGS: Heart is normal in size. Normal mediastinal contours. Aortic atherosclerosis. There is diffuse interstitial coarsening. No focal airspace disease, pleural effusion, or pneumothorax. No acute osseous abnormalities are seen. IMPRESSION: Diffuse interstitial coarsening, nonspecific but likely chronic. Recommend correlation for smoking history. Electronically Signed   By: Keith Rake M.D.   On: 05/12/2020 20:08   ECHOCARDIOGRAM COMPLETE  Result Date: 05/13/2020    ECHOCARDIOGRAM REPORT   Patient Name:   Tiffany White Date of Exam: 05/13/2020 Medical Rec #:  [id number]     Height:       62.5 in Accession #:    [id number]    Weight:       147.0 lb Date of Birth:  [birth year]      BSA:          1.687 m Patient Age:    75 years      BP:           142/58 mmHg Patient Gender: F             HR:           75 bpm. Exam Location:  [facility] Procedure: 2D Echo, Cardiac Doppler and Color Doppler Indications:    Chest Pain R07.9  History:        Patient has no prior history of Echocardiogram examinations.                 Risk Factors:Hypertension, Diabetes and Dyslipidemia. Elevated                 troponin I level, Chronic kidney disease, stage 3a ([place]).  Sonographer:    Alvino Chapel RCS Referring Phys: [phone] COURAGE EMOKPAE IMPRESSIONS  1. Left ventricular ejection fraction, by estimation, is 65 to 70%. The left ventricle has normal function. The left ventricle has no regional wall motion abnormalities. There is mild left ventricular hypertrophy. Left ventricular diastolic parameters are consistent with Grade I diastolic dysfunction (impaired relaxation). Elevated left atrial pressure.  2. Right ventricular systolic function is normal. The right ventricular size is normal.  3. The mitral valve is normal in structure. No evidence of mitral valve regurgitation. No evidence of mitral stenosis.  4. The aortic valve is tricuspid. There is mild calcification of the aortic valve. There is mild thickening of the aortic valve. Aortic valve regurgitation is mild. No aortic stenosis is present.  5. The inferior vena cava is normal in size with greater than 50% respiratory variability, suggesting right atrial pressure of 3 mmHg. FINDINGS  Left Ventricle: Left ventricular ejection fraction, by estimation, is 65 to 70%. The left ventricle has normal function. The left ventricle has no regional wall motion abnormalities. The left ventricular internal cavity size was normal in size. There is  mild left ventricular hypertrophy. Left ventricular diastolic parameters are consistent with Grade I diastolic dysfunction (impaired relaxation). Elevated left atrial pressure. Right Ventricle: The right ventricular size is normal. No increase in right ventricular wall thickness. Right ventricular systolic function is normal. Left Atrium: Left atrial size was normal in  size. Right Atrium: Right atrial size was normal in size. Pericardium: The pericardium was not well visualized. Mitral Valve: The mitral valve is normal in structure. There is mild thickening of the mitral valve leaflet(s). There is mild calcification of the mitral  valve leaflet(s). Mild mitral annular calcification. No evidence of mitral valve regurgitation. No evidence of mitral valve stenosis. Tricuspid Valve: The tricuspid valve is normal in structure. Tricuspid valve regurgitation is not demonstrated. No evidence of tricuspid stenosis. Aortic Valve: The aortic valve is tricuspid. There is mild calcification of the aortic valve. There is mild thickening of the aortic valve. There is mild aortic valve annular calcification. Aortic valve regurgitation is mild. No aortic stenosis is present. Aortic valve mean gradient measures 3.0 mmHg. Aortic valve peak gradient measures 5.3 mmHg. Aortic valve area, by VTI measures 2.50 cm. Pulmonic Valve: The pulmonic valve was not well visualized. Pulmonic valve regurgitation is mild. No evidence of pulmonic stenosis. Aorta: The aortic root is normal in size and structure. Pulmonary Artery: Indeterminant PASP, inadequate TR jet. Venous: The inferior vena cava is normal in size with greater than 50% respiratory variability, suggesting right atrial pressure of 3 mmHg. IAS/Shunts: No atrial level shunt detected by color flow Doppler.  LEFT VENTRICLE PLAX 2D LVIDd:         4.20 cm  Diastology LVIDs:         2.30 cm  LV e' medial:    5.11 cm/s LV PW:         1.10 cm  LV E/e' medial:  15.3 LV IVS:        1.00 cm  LV e' lateral:   4.57 cm/s LVOT diam:     1.80 cm  LV E/e' lateral: 17.1 LV SV:         61 LV SV Index:   36 LVOT Area:     2.54 cm  RIGHT VENTRICLE RV S prime:     14.40 cm/s TAPSE (M-mode): 2.1 cm LEFT ATRIUM             Index       RIGHT ATRIUM           Index LA diam:        2.60 cm 1.54 cm/m  RA Area:     11.00 cm LA Vol (A2C):   35.3 ml 20.92 ml/m RA Volume:    23.70 ml  14.05 ml/m LA Vol (A4C):   37.4 ml 22.17 ml/m LA Biplane Vol: 39.3 ml 23.29 ml/m  AORTIC VALVE AV Area (Vmax):    2.40 cm AV Area (Vmean):   2.10 cm AV Area (VTI):     2.50 cm AV Vmax:           115.54 cm/s AV Vmean:          82.829 cm/s AV VTI:            0.242 m AV Peak Grad:      5.3 mmHg AV Mean Grad:      3.0 mmHg LVOT Vmax:         109.00 cm/s LVOT Vmean:        68.500 cm/s LVOT VTI:          0.238 m LVOT/AV VTI ratio: 0.98  AORTA Ao Root diam: 2.90 cm MITRAL VALVE MV Area (PHT): 3.08 cm     SHUNTS MV Decel Time: 246 msec     Systemic VTI:  0.24 m MV E velocity: 78.10 cm/s   Systemic Diam: 1.80 cm MV A velocity: 111.00 cm/s MV E/A ratio:  0.70 Carlyle Dolly MD Electronically signed by Carlyle Dolly MD Signature Date/Time: 05/13/2020/3:21:19 PM    Final     Cardiac Studies     Patient Profile  75 y.o. female with a past medical history of HLD, type II DM, hypothyroidism, and family history of CAD who is being seen for chest pain.   Assessment & Plan    1. Chest Pain Presents with 1 year history of worsening dyspnea on exertion has experienced chest pressure/burning with activity for the past 2 to 3 weeks. She is now experiencing chest pain with minimal exertion. - Cardiac Risk factors of HLD, T2D, family history of CAD, and hx of tobacco use.  - Patient started on Heparin, ASA, and Crestor at neighboring hospital. Transferred to [facility] on 4/12 with plans for LHC on 4/13. -Last night she had symptoms with minimal exertion walking to restroom, HS troponins 10>16 with no changes on EKG. Pain relieved with rest and also given SL nitro. No active chest pain on exam this morning, patient is at rest. Plan; - RHC today - Continue Heparin - Continue medical management, Asprin, Metoprolol 12.5 BID, and Losartan 12.5  2. HLD ASCVD risk 42.7 %, HDL 36, LDL 106. History of statin intolerance under allergies.  - Continue Crestor 10 mg daily  3. Type 2 Diabetes Mellitus   Hgb A1c on 05/13/2020 7.8. Goal 7 to 8 given age. Holding home Metformin. Blood glucose 144 to 234 on 4/12, required 8 units of Aspart. Morning glucose 114.  - Continue SSI- S and HS scale    For questions or updates, please contact [facility] Please consult [email or web address] for contact info under Cardiology/STEMI.      Signed, Lorene Dy, MD  PGY2 IM 05/14/2020, 10:07 AM    Patient seen and examined.  Agree with above documentation.  On exam, patient is alert and oriented, regular rate and rhythm, no murmurs, lungs CTAB, no LE edema or JVD.  Telemetry shows NSR with rate 60s.  Currently chest pain free.  Plan for LHC today.  Donato Heinz, MD

## 2020-05-14 NOTE — Progress Notes (Signed)
[place] for heparin Indication: chest pain/ACS  Allergies  Allergen Reactions  . Statins Other (See Comments)    Severe Body aches     Patient Measurements: Height: 5' 2.5" (158.8 cm) Weight: 66.5 kg (146 lb 11.2 oz) IBW/kg (Calculated) : 51.25 Heparin Dosing Weight: 65 kg  Vital Signs: Temp: 98.2 F (36.8 C) (04/13 1112) Temp Source: Oral (04/13 1112) BP: 146/61 (04/13 1112) Pulse Rate: 61 (04/13 1112)  Labs: Recent Labs    05/12/20 1959 05/12/20 2147 05/13/20 0554 05/13/20 2043 05/13/20 2241 05/14/20 0456  HGB 12.4  --  11.6*  --   --  11.8*  HCT 38.5  --  36.5  --   --  36.1  PLT 338  --  315  --   --  299  APTT  --   --  28  --   --   --   LABPROT  --   --  13.2  --   --   --   INR  --   --  1.0  --   --   --   HEPARINUNFRC  --   --   --  0.36  --  0.19*  CREATININE 1.13*  --  1.08*  --   --   --   TROPONINIHS 41* 33*  --  10 16  --     Estimated Creatinine Clearance: 40.8 mL/min (A) (by C-G formula based on SCr of 1.08 mg/dL (H)).  Assessment: Pharmacy consulted to dose heparin in patient with chest pain/ACS.  Heparin level is subtherapeutic at 0.19 units/mL.  No bleeding or infusion issues reported. CBC stable. Plan for cath today.  Goal of Therapy:  Heparin level 0.3-0.7 units/ml Monitor platelets by anticoagulation protocol: Yes   Plan:  Increase heparin gtt to 900 units/hr F/u post cath   Thank you for allowing [place] to participate in this patients care. Jens Som, PharmD 05/14/2020 12:34 PM  Please check [email or web address] for unit-specific pharmacy phone numbers.

## 2020-05-14 NOTE — Progress Notes (Signed)
[place] for heparin Indication: chest pain/ACS  Allergies  Allergen Reactions  . Statins Other (See Comments)    Severe Body aches     Patient Measurements: Height: 5' 2.5" (158.8 cm) Weight: 66.5 kg (146 lb 11.2 oz) IBW/kg (Calculated) : 51.25 Heparin Dosing Weight: 65 kg  Vital Signs: Temp: 98.2 F (36.8 C) (04/13 1112) Temp Source: Oral (04/13 1112) BP: 132/59 (04/13 1610) Pulse Rate: 66 (04/13 1610)  Labs: Recent Labs    05/12/20 1959 05/12/20 2147 05/13/20 0554 05/13/20 2043 05/13/20 2241 05/14/20 0456  HGB 12.4  --  11.6*  --   --  11.8*  HCT 38.5  --  36.5  --   --  36.1  PLT 338  --  315  --   --  299  APTT  --   --  28  --   --   --   LABPROT  --   --  13.2  --   --   --   INR  --   --  1.0  --   --   --   HEPARINUNFRC  --   --   --  0.36  --  0.19*  CREATININE 1.13*  --  1.08*  --   --   --   TROPONINIHS 41* 33*  --  10 16  --     Estimated Creatinine Clearance: 40.8 mL/min (A) (by C-G formula based on SCr of 1.08 mg/dL (H)).  Assessment: Pharmacy consulted to dose heparin in patient with chest pain/ACS.  No bleeding issues reported.  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 Plan to restart heparin 8 hr after sheath removal (documented on 4/13@1552 ). Will resume previous rate. CBC stable.   Goal of Therapy:  Heparin level 0.3-0.7 units/ml Monitor platelets by anticoagulation protocol: Yes   Plan:  Restart heparin gtt at 900 units/hr on 4/14@0000  Order heparin level 8 hours after restart  Monitor CBC, HL, and for s/sx of bleeding   Thank you for allowing [place] to participate in this patients care.  Antonietta Jewel, PharmD, [place] Clinical Pharmacist  Phone: [phone] 05/14/2020 4:24 PM  Please check AMION for all [place] phone numbers After 10:00 PM, call [place] [phone]

## 2020-05-14 NOTE — Progress Notes (Signed)
PROGRESS NOTE    Tiffany White   JOA:[id number]  DOB: May 15, 1945  DOA: 05/12/2020 PCP: Almira Bar, MD   Brief Narrative:  Tiffany White 75-year-old female with hypertension, diabetes mellitus type 2, obesity, hypothyroidism who presented to the hospital for left-sided chest pain that felt like pressure or burning associated with shortness of breath.  Card in the hospital when she was ambulating to the bathroom and she was given nitroglycerin for it.   Subjective: No further chest pain - awaiting cath.    Assessment & Plan:   Principal Problem: Angina -Placed on heparin -Cardiac cath today reveals severe coronary artery disease-CT surgery consult for CABG  -The echo reveals grade 1 diastolic heart failure Aspirin, metoprolol, Zetia, Crestor, heparin -Nitroglycerin infusion being started today  Active Problems: Dyslipidemia -DL 36, LDL 106, triglycerides 228 -Continue Crestor and Zetia    Essential hypertension -Has been started on losartan, metoprolol and nitroglycerin infusion for above -Follow    Hypothyroidism -Continue Synthroid    Hyperglycemia due to diabetes mellitus 2 -Continue sliding scale insulin-Metformin and NovoLog 70/30 on hold -Start Lantus 10 units nightly today  Vitamin D and vitamin B12 deficiency -Continue replacement    Time spent in minutes: 35 DVT prophylaxis: Heparin infusion Code Status: Full code Family Communication:  Level of Care: Level of care: Telemetry Cardiac Disposition Plan:  Status is: Observation  The patient will require care spanning > 2 midnights and should be moved to inpatient because: "Severe coronary artery disease with angina  Dispo: The patient is from: Home              Anticipated d/c is to: Home              Patient currently is not medically stable to d/c.   Difficult to place patient No      Consultants:   Cardiology Procedures:   Cath Antimicrobials:  Anti-infectives (From admission,  onward)   None       Objective: Vitals:   05/14/20 1541 05/14/20 1546 05/14/20 1610 05/14/20 1700  BP: (!) 185/76 (!) 197/96 (!) 132/59 (!) 136/56  Pulse: 75 88 66 65  Resp: 17 16 17 13   Temp:      TempSrc:      SpO2: 100% 99% 98% 98%  Weight:      Height:        Intake/Output Summary (Last 24 hours) at 05/14/2020 1749 Last data filed at 05/14/2020 1420 Gross per 24 hour  Intake 460.09 ml  Output 1550 ml  Net -1089.91 ml   Filed Weights   05/12/20 1627 05/14/20 0236  Weight: 66.7 kg 66.5 kg    Examination: General exam: Appears comfortable  HEENT: PERRLA, oral mucosa moist, no sclera icterus or thrush Respiratory system: Clear to auscultation. Respiratory effort normal. Cardiovascular system: S1 & S2 heard, RRR.   Gastrointestinal system: Abdomen soft, non-tender, nondistended. Normal bowel sounds. Central nervous system: Alert and oriented. No focal neurological deficits. Extremities: No cyanosis, clubbing or edema Skin: No rashes or ulcers Psychiatry:  Mood & affect appropriate.     Data Reviewed: I have personally reviewed following labs and imaging studies  CBC: Recent Labs  Lab 05/12/20 1959 05/13/20 0554 05/14/20 0456  WBC 9.3 10.5 8.0  HGB 12.4 11.6* 11.8*  HCT 38.5 36.5 36.1  MCV 92.3 91.9 90.9  PLT 338 315 601   Basic Metabolic Panel: Recent Labs  Lab 05/12/20 1959 05/13/20 0554  NA 139 140  K 4.1 4.1  CL  105 106  CO2 23 24  GLUCOSE 150* 129*  BUN 24* 21  CREATININE 1.13* 1.08*  CALCIUM 9.8 9.5  MG  --  2.0  PHOS  --  3.7   GFR: Estimated Creatinine Clearance: 40.8 mL/min (A) (by C-G formula based on SCr of 1.08 mg/dL (H)). Liver Function Tests: Recent Labs  Lab 05/13/20 0554  AST 16  ALT 16  ALKPHOS 68  BILITOT 0.6  PROT 7.4  ALBUMIN 3.9   No results for input(s): LIPASE, AMYLASE in the last 168 hours. No results for input(s): AMMONIA in the last 168 hours. Coagulation Profile: Recent Labs  Lab 05/13/20 0554  INR 1.0    Cardiac Enzymes: No results for input(s): CKTOTAL, CKMB, CKMBINDEX, TROPONINI in the last 168 hours. BNP (last 3 results) No results for input(s): PROBNP in the last 8760 hours. HbA1C: Recent Labs    05/13/20 0554  HGBA1C 7.8*   CBG: Recent Labs  Lab 05/13/20 1616 05/13/20 2119 05/14/20 0607 05/14/20 0858 05/14/20 1635  GLUCAP 123* 234* 155* 114* 119*   Lipid Profile: Recent Labs    05/14/20 0456  CHOL 188  HDL 36*  LDLCALC 106*  TRIG 228*  CHOLHDL 5.2   Thyroid Function Tests: No results for input(s): TSH, T4TOTAL, FREET4, T3FREE, THYROIDAB in the last 72 hours. Anemia Panel: No results for input(s): VITAMINB12, FOLATE, FERRITIN, TIBC, IRON, RETICCTPCT in the last 72 hours. Urine analysis: No results found for: COLORURINE, APPEARANCEUR, LABSPEC, PHURINE, GLUCOSEU, HGBUR, BILIRUBINUR, KETONESUR, PROTEINUR, UROBILINOGEN, NITRITE, LEUKOCYTESUR Sepsis Labs: @LABRCNTIP (procalcitonin:4,lacticidven:4) ) Recent Results (from the past 240 hour(s))  Resp Panel by RT-PCR (Flu A&B, Covid) Nasopharyngeal Swab     Status: None   Collection Time: 05/13/20  1:46 AM   Specimen: Nasopharyngeal Swab; Nasopharyngeal(NP) swabs in vial transport medium  Result Value Ref Range Status   SARS Coronavirus 2 by RT PCR NEGATIVE NEGATIVE Final    Comment: (NOTE) SARS-CoV-2 target nucleic acids are NOT DETECTED.  The SARS-CoV-2 RNA is generally detectable in upper respiratory specimens during the acute phase of infection. The lowest concentration of SARS-CoV-2 viral copies this assay can detect is 138 copies/mL. A negative result does not preclude SARS-Cov-2 infection and should not be used as the sole basis for treatment or other patient management decisions. A negative result may occur with  improper specimen collection/handling, submission of specimen other than nasopharyngeal swab, presence of viral mutation(s) within the areas targeted by this assay, and inadequate number of  viral copies(<138 copies/mL). A negative result must be combined with clinical observations, patient history, and epidemiological information. The expected result is Negative.  Fact Sheet for Patients:  EntrepreneurPulse.com.au  Fact Sheet for Healthcare Providers:  IncredibleEmployment.be  This test is no t yet approved or cleared by the [place] FDA and  has been authorized for detection and/or diagnosis of SARS-CoV-2 by FDA under an Emergency Use Authorization (EUA). This EUA will remain  in effect (meaning this test can be used) for the duration of the COVID-19 declaration under Section 564(b)(1) of the Act, 21 U.S.C.section 360bbb-3(b)(1), unless the authorization is terminated  or revoked sooner.       Influenza A by PCR NEGATIVE NEGATIVE Final   Influenza B by PCR NEGATIVE NEGATIVE Final    Comment: (NOTE) The Xpert Xpress SARS-CoV-2/FLU/RSV plus assay is intended as an aid in the diagnosis of influenza from Nasopharyngeal swab specimens and should not be used as a sole basis for treatment. Nasal washings and aspirates are unacceptable for Xpert Xpress  SARS-CoV-2/FLU/RSV testing.  Fact Sheet for Patients: EntrepreneurPulse.com.au  Fact Sheet for Healthcare Providers: IncredibleEmployment.be  This test is not yet approved or cleared by the [place] FDA and has been authorized for detection and/or diagnosis of SARS-CoV-2 by FDA under an Emergency Use Authorization (EUA). This EUA will remain in effect (meaning this test can be used) for the duration of the COVID-19 declaration under Section 564(b)(1) of the Act, 21 U.S.C. section 360bbb-3(b)(1), unless the authorization is terminated or revoked.  Performed at [facility], [address] [place], [place] [address]          Radiology Studies: DG Chest 2 View  Result Date: 05/12/2020 CLINICAL DATA:  chest pain EXAM: CHEST - 2 VIEW  COMPARISON:  None. FINDINGS: Heart is normal in size. Normal mediastinal contours. Aortic atherosclerosis. There is diffuse interstitial coarsening. No focal airspace disease, pleural effusion, or pneumothorax. No acute osseous abnormalities are seen. IMPRESSION: Diffuse interstitial coarsening, nonspecific but likely chronic. Recommend correlation for smoking history. Electronically Signed   By: Keith Rake M.D.   On: 05/12/2020 20:08   CARDIAC CATHETERIZATION  Result Date: 05/14/2020  Ost LAD to Prox LAD lesion is 95% stenosed.  Ost Cx to Prox Cx lesion is 80% stenosed.  Ost RCA lesion is 70% stenosed.  Prox RCA lesion is 70% stenosed.  Prox RCA to Mid RCA lesion is 50% stenosed.  Mid RCA lesion is 75% stenosed.  Dist RCA lesion is 90% stenosed.  3rd RPL lesion is 95% stenosed.  Ost LM to Dist LM lesion is 10% stenosed.  There is significant coronary calcification with severe three-vessel coronary obstructive disease.  There is 95% ostial LAD stenosis, 70 to 80% ostial circumflex stenosis and diffuse stenoses in a calcified RCA with 70% ostial 70, 50 and 75% mid stenoses, distal 90% stenosis and 95% PLA stenosis. LVEDP 7 mmHg. RECOMMENDATION: Surgical consultation for CABG revascularization.  The patient was started on IV nitroglycerin during the procedure.  She will be started on heparin therapy 8 hours post TR band.  There is a reported statin allergy.  Zetia will be started; she may be a candidate for PCSK9 inhibition.   ECHOCARDIOGRAM COMPLETE  Result Date: 05/13/2020    ECHOCARDIOGRAM REPORT   Patient Name:   Tiffany White Date of Exam: 05/13/2020 Medical Rec #:  [id number]     Height:       62.5 in Accession #:    [id number]    Weight:       147.0 lb Date of Birth:  [birth year]      BSA:          1.687 m Patient Age:    26 years      BP:           142/58 mmHg Patient Gender: F             HR:           75 bpm. Exam Location:  [facility] Procedure: 2D Echo, Cardiac Doppler and Color Doppler  Indications:    Chest Pain R07.9  History:        Patient has no prior history of Echocardiogram examinations.                 Risk Factors:Hypertension, Diabetes and Dyslipidemia. Elevated                 troponin I level, Chronic kidney disease, stage 3a ([place]).  Sonographer:    Alvino Chapel RCS Referring Phys:  [place]  1. Left ventricular ejection fraction, by estimation, is 65 to 70%. The left ventricle has normal function. The left ventricle has no regional wall motion abnormalities. There is mild left ventricular hypertrophy. Left ventricular diastolic parameters are consistent with Grade I diastolic dysfunction (impaired relaxation). Elevated left atrial pressure.  2. Right ventricular systolic function is normal. The right ventricular size is normal.  3. The mitral valve is normal in structure. No evidence of mitral valve regurgitation. No evidence of mitral stenosis.  4. The aortic valve is tricuspid. There is mild calcification of the aortic valve. There is mild thickening of the aortic valve. Aortic valve regurgitation is mild. No aortic stenosis is present.  5. The inferior vena cava is normal in size with greater than 50% respiratory variability, suggesting right atrial pressure of 3 mmHg. FINDINGS  Left Ventricle: Left ventricular ejection fraction, by estimation, is 65 to 70%. The left ventricle has normal function. The left ventricle has no regional wall motion abnormalities. The left ventricular internal cavity size was normal in size. There is  mild left ventricular hypertrophy. Left ventricular diastolic parameters are consistent with Grade I diastolic dysfunction (impaired relaxation). Elevated left atrial pressure. Right Ventricle: The right ventricular size is normal. No increase in right ventricular wall thickness. Right ventricular systolic function is normal. Left Atrium: Left atrial size was normal in size. Right Atrium: Right atrial size was normal in size.  Pericardium: The pericardium was not well visualized. Mitral Valve: The mitral valve is normal in structure. There is mild thickening of the mitral valve leaflet(s). There is mild calcification of the mitral valve leaflet(s). Mild mitral annular calcification. No evidence of mitral valve regurgitation. No evidence of mitral valve stenosis. Tricuspid Valve: The tricuspid valve is normal in structure. Tricuspid valve regurgitation is not demonstrated. No evidence of tricuspid stenosis. Aortic Valve: The aortic valve is tricuspid. There is mild calcification of the aortic valve. There is mild thickening of the aortic valve. There is mild aortic valve annular calcification. Aortic valve regurgitation is mild. No aortic stenosis is present. Aortic valve mean gradient measures 3.0 mmHg. Aortic valve peak gradient measures 5.3 mmHg. Aortic valve area, by VTI measures 2.50 cm. Pulmonic Valve: The pulmonic valve was not well visualized. Pulmonic valve regurgitation is mild. No evidence of pulmonic stenosis. Aorta: The aortic root is normal in size and structure. Pulmonary Artery: Indeterminant PASP, inadequate TR jet. Venous: The inferior vena cava is normal in size with greater than 50% respiratory variability, suggesting right atrial pressure of 3 mmHg. IAS/Shunts: No atrial level shunt detected by color flow Doppler.  LEFT VENTRICLE PLAX 2D LVIDd:         4.20 cm  Diastology LVIDs:         2.30 cm  LV e' medial:    5.11 cm/s LV PW:         1.10 cm  LV E/e' medial:  15.3 LV IVS:        1.00 cm  LV e' lateral:   4.57 cm/s LVOT diam:     1.80 cm  LV E/e' lateral: 17.1 LV SV:         61 LV SV Index:   36 LVOT Area:     2.54 cm  RIGHT VENTRICLE RV S prime:     14.40 cm/s TAPSE (M-mode): 2.1 cm LEFT ATRIUM             Index       RIGHT ATRIUM  Index LA diam:        2.60 cm 1.54 cm/m  RA Area:     11.00 cm LA Vol (A2C):   35.3 ml 20.92 ml/m RA Volume:   23.70 ml  14.05 ml/m LA Vol (A4C):   37.4 ml 22.17 ml/m LA  Biplane Vol: 39.3 ml 23.29 ml/m  AORTIC VALVE AV Area (Vmax):    2.40 cm AV Area (Vmean):   2.10 cm AV Area (VTI):     2.50 cm AV Vmax:           115.54 cm/s AV Vmean:          82.829 cm/s AV VTI:            0.242 m AV Peak Grad:      5.3 mmHg AV Mean Grad:      3.0 mmHg LVOT Vmax:         109.00 cm/s LVOT Vmean:        68.500 cm/s LVOT VTI:          0.238 m LVOT/AV VTI ratio: 0.98  AORTA Ao Root diam: 2.90 cm MITRAL VALVE MV Area (PHT): 3.08 cm     SHUNTS MV Decel Time: 246 msec     Systemic VTI:  0.24 m MV E velocity: 78.10 cm/s   Systemic Diam: 1.80 cm MV A velocity: 111.00 cm/s MV E/A ratio:  0.70 Carlyle Dolly MD Electronically signed by Carlyle Dolly MD Signature Date/Time: 05/13/2020/3:21:19 PM    Final       Scheduled Meds: . Derrill Memo ON 05/15/2020] aspirin  81 mg Oral Daily  . cholecalciferol  1,000 Units Oral Daily  . ezetimibe  10 mg Oral Daily  . insulin aspart  0-5 Units Subcutaneous QHS  . insulin aspart  0-9 Units Subcutaneous TID WC  . levothyroxine  75 mcg Oral Q0600  . [START ON 05/15/2020] losartan  12.5 mg Oral Daily  . metoprolol tartrate  12.5 mg Oral BID  . nitroGLYCERIN  1 inch Topical Once  . omega-3 acid ethyl esters  1 g Oral Daily  . rosuvastatin  10 mg Oral q1800  . sodium chloride flush  3 mL Intravenous Q12H  . cyanocobalamin  1,000 mcg Oral Daily   Continuous Infusions: . sodium chloride 100 mL/hr at 05/14/20 1636  . sodium chloride    . [START ON 05/15/2020] sodium chloride    . [START ON 05/15/2020] heparin    . nitroGLYCERIN       LOS: 0 days      Debbe Odea, MD [facility] Pager: [email or web address] 05/14/2020, 5:49 PM

## 2020-05-15 ENCOUNTER — Encounter (HOSPITAL_COMMUNITY): Payer: Self-pay | Admitting: Cardiovascular Disease

## 2020-05-15 ENCOUNTER — Inpatient Hospital Stay (HOSPITAL_COMMUNITY): Payer: Medicare HMO

## 2020-05-15 DIAGNOSIS — I25110 Atherosclerotic heart disease of native coronary artery with unstable angina pectoris: Secondary | ICD-10-CM

## 2020-05-15 DIAGNOSIS — E785 Hyperlipidemia, unspecified: Secondary | ICD-10-CM | POA: Diagnosis not present

## 2020-05-15 DIAGNOSIS — Z01810 Encounter for preprocedural cardiovascular examination: Secondary | ICD-10-CM

## 2020-05-15 DIAGNOSIS — K5900 Constipation, unspecified: Secondary | ICD-10-CM

## 2020-05-15 LAB — URINALYSIS, ROUTINE W REFLEX MICROSCOPIC
Bilirubin Urine: NEGATIVE
Glucose, UA: >=500 mg/dL — AB
Hgb urine dipstick: NEGATIVE
Ketones, ur: NEGATIVE mg/dL
Nitrite: NEGATIVE
Protein, ur: 30 mg/dL — AB
Specific Gravity, Urine: 1.006 (ref 1.005–1.030)
pH: 6 (ref 5.0–8.0)

## 2020-05-15 LAB — BASIC METABOLIC PANEL
Anion gap: 9 (ref 5–15)
BUN: 16 mg/dL (ref 8–23)
CO2: 21 mmol/L — ABNORMAL LOW (ref 22–32)
Calcium: 8.8 mg/dL — ABNORMAL LOW (ref 8.9–10.3)
Chloride: 108 mmol/L (ref 98–111)
Creatinine, Ser: 1.09 mg/dL — ABNORMAL HIGH (ref 0.44–1.00)
GFR, Estimated: 53 mL/min — ABNORMAL LOW (ref >60)
Glucose, Bld: 159 mg/dL — ABNORMAL HIGH (ref 70–99)
Potassium: 3.5 mmol/L (ref 3.5–5.1)
Sodium: 138 mmol/L (ref 135–145)

## 2020-05-15 LAB — CBC
HCT: 33.5 % — ABNORMAL LOW (ref 36.0–46.0)
Hemoglobin: 11 g/dL — ABNORMAL LOW (ref 12.0–15.0)
MCH: 29.6 pg (ref 26.0–34.0)
MCHC: 32.8 g/dL (ref 30.0–36.0)
MCV: 90.3 fL (ref 80.0–100.0)
Platelets: 273 10*3/uL (ref 150–400)
RBC: 3.71 MIL/uL — ABNORMAL LOW (ref 3.87–5.11)
RDW: 13.2 % (ref 11.5–15.5)
WBC: 8.5 10*3/uL (ref 4.0–10.5)
nRBC: 0 % (ref 0.0–0.2)

## 2020-05-15 LAB — ABO/RH: ABO/RH(D): O POS

## 2020-05-15 LAB — GLUCOSE, CAPILLARY
Glucose-Capillary: 147 mg/dL — ABNORMAL HIGH (ref 70–99)
Glucose-Capillary: 189 mg/dL — ABNORMAL HIGH (ref 70–99)
Glucose-Capillary: 163 mg/dL — ABNORMAL HIGH (ref 70–99)
Glucose-Capillary: 227 mg/dL — ABNORMAL HIGH (ref 70–99)

## 2020-05-15 LAB — HEPARIN LEVEL (UNFRACTIONATED)
Heparin Unfractionated: 0.23 IU/mL — ABNORMAL LOW (ref 0.30–0.70)
Heparin Unfractionated: 0.34 IU/mL (ref 0.30–0.70)

## 2020-05-15 LAB — MRSA PCR SCREENING: MRSA by PCR: NEGATIVE

## 2020-05-15 MED ORDER — MILRINONE LACTATE IN DEXTROSE 20-5 MG/100ML-% IV SOLN
0.3000 ug/kg/min | INTRAVENOUS | Status: DC
  Filled 2020-05-15: qty 100

## 2020-05-15 MED ORDER — POTASSIUM CHLORIDE CRYS ER 20 MEQ PO TBCR
30.0000 meq | EXTENDED_RELEASE_TABLET | Freq: Two times a day (BID) | ORAL | Status: AC
  Administered 2020-05-15 (×2): 30 meq via ORAL
  Filled 2020-05-15 (×2): qty 1

## 2020-05-15 MED ORDER — NITROGLYCERIN IN D5W 200-5 MCG/ML-% IV SOLN
2.0000 ug/min | INTRAVENOUS | Status: DC
  Filled 2020-05-15: qty 250

## 2020-05-15 MED ORDER — SODIUM CHLORIDE 0.9 % IV SOLN
750.0000 mg | INTRAVENOUS | Status: DC
  Filled 2020-05-15: qty 750

## 2020-05-15 MED ORDER — MAGNESIUM SULFATE 50 % IJ SOLN
40.0000 meq | INTRAMUSCULAR | Status: DC
  Filled 2020-05-15: qty 9.85

## 2020-05-15 MED ORDER — PHENYLEPHRINE HCL-NACL 20-0.9 MG/250ML-% IV SOLN
30.0000 ug/min | INTRAVENOUS | Status: DC
Start: 2020-05-16 — End: 2020-05-16
  Filled 2020-05-15: qty 250

## 2020-05-15 MED ORDER — EPINEPHRINE HCL 5 MG/250ML IV SOLN IN NS
0.0000 ug/min | INTRAVENOUS | Status: DC
  Filled 2020-05-15: qty 250

## 2020-05-15 MED ORDER — DEXMEDETOMIDINE HCL IN NACL 400 MCG/100ML IV SOLN
0.1000 ug/kg/h | INTRAVENOUS | Status: DC
  Filled 2020-05-15: qty 100

## 2020-05-15 MED ORDER — POTASSIUM CHLORIDE 2 MEQ/ML IV SOLN
80.0000 meq | INTRAVENOUS | Status: DC
  Filled 2020-05-15: qty 40

## 2020-05-15 MED ORDER — TRANEXAMIC ACID 1000 MG/10ML IV SOLN
1.5000 mg/kg/h | INTRAVENOUS | Status: DC
  Filled 2020-05-15: qty 25

## 2020-05-15 MED ORDER — SODIUM CHLORIDE 0.9 % IV SOLN
INTRAVENOUS | Status: DC
  Filled 2020-05-15: qty 30

## 2020-05-15 MED ORDER — TRANEXAMIC ACID (OHS) PUMP PRIME SOLUTION
2.0000 mg/kg | INTRAVENOUS | Status: DC
  Filled 2020-05-15: qty 1.35

## 2020-05-15 MED ORDER — SODIUM CHLORIDE 0.9 % IV SOLN
1.5000 g | INTRAVENOUS | Status: DC
  Filled 2020-05-15: qty 1.5

## 2020-05-15 MED ORDER — TRANEXAMIC ACID (OHS) BOLUS VIA INFUSION
15.0000 mg/kg | INTRAVENOUS | Status: DC
  Filled 2020-05-15: qty 1011

## 2020-05-15 MED ORDER — NOREPINEPHRINE 4 MG/250ML-% IV SOLN
0.0000 ug/min | INTRAVENOUS | Status: DC
  Filled 2020-05-15: qty 250

## 2020-05-15 MED ORDER — VANCOMYCIN HCL 1250 MG/250ML IV SOLN
1250.0000 mg | INTRAVENOUS | Status: DC
  Filled 2020-05-15: qty 250

## 2020-05-15 MED ORDER — PLASMA-LYTE 148 IV SOLN
INTRAVENOUS | Status: DC
  Filled 2020-05-15: qty 2.5

## 2020-05-15 MED ORDER — POLYETHYLENE GLYCOL 3350 17 G PO PACK: 17.0000 g | PACK | Freq: Every day | ORAL | Status: DC | PRN

## 2020-05-15 MED ORDER — INSULIN REGULAR(HUMAN) IN NACL 100-0.9 UT/100ML-% IV SOLN
INTRAVENOUS | Status: DC
  Filled 2020-05-15: qty 100

## 2020-05-15 MED FILL — Lidocaine HCl Local Preservative Free (PF) Inj 1%: INTRAMUSCULAR | Qty: 30 | Status: AC

## 2020-05-15 NOTE — Progress Notes (Signed)
[facility] for heparin Indication: chest pain/ACS  Allergies  Allergen Reactions  . Statins Other (See Comments)    Severe Body aches     Patient Measurements: Height: 5' 2.5" (158.8 cm) Weight: 67.4 kg (148 lb 9.6 oz) IBW/kg (Calculated) : 51.25 Heparin Dosing Weight: 65 kg  Vital Signs: Temp: 98.1 F (36.7 C) (04/14 0448) Temp Source: Oral (04/14 0448) BP: 135/55 (04/14 0448) Pulse Rate: 73 (04/14 0448)  Labs: Recent Labs    05/12/20 1959 05/12/20 2147 05/13/20 0554 05/13/20 2043 05/13/20 2241 05/14/20 0456 05/15/20 0350 05/15/20 0809  HGB 12.4  --  11.6*  --   --  11.8* 11.0*  --   HCT 38.5  --  36.5  --   --  36.1 33.5*  --   PLT 338  --  315  --   --  299 273  --   APTT  --   --  28  --   --   --   --   --   LABPROT  --   --  13.2  --   --   --   --   --   INR  --   --  1.0  --   --   --   --   --   HEPARINUNFRC  --   --   --  0.36  --  0.19*  --  0.23*  CREATININE 1.13*  --  1.08*  --   --   --  1.09*  --   TROPONINIHS 41* 33*  --  10 16  --   --   --     Estimated Creatinine Clearance: 40.6 mL/min (A) (by C-G formula based on SCr of 1.09 mg/dL (H)).  Assessment: Pharmacy consulted to dose heparin in patient with chest pain/ACS.  No bleeding issues reported.  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   Heparin level below goal this morning. No bleeding issues noted. CBC stable overnight.   Goal of Therapy:  Heparin level 0.3-0.7 units/ml Monitor platelets by anticoagulation protocol: Yes   Plan:  Increase heparin gtt to 1050 units/hr  Order heparin level 8 hours after restart  Monitor CBC, HL, and for s/sx of bleeding   Thank you for allowing [place] to participate in this patients care.  Erin Hearing PharmD., BCPS Clinical Pharmacist 05/15/2020 9:40 AM   Please check AMION for all [place] phone numbers After 10:00 PM, call [place] [phone]

## 2020-05-15 NOTE — Progress Notes (Addendum)
Pt is experiencing chest burning rating at 5 on scale. After pivoting to the [facility] blood pressures elevated and valium and Nitro with Given titrated from Bottle.

## 2020-05-15 NOTE — Progress Notes (Signed)
Pre cabg has been completed.   Preliminary results in CV Proc.   Tiffany White 05/15/2020 11:48 AM

## 2020-05-15 NOTE — Progress Notes (Signed)
Patient ID: Tiffany White, female   DOB: [birth year], 75 y.o.   MRN: [id number] TCTS:  OR schedule is full at 7:30 am tomorrow. Dr. Orvan Seen will evaluate in the am and decide about timing of surgery. She has been having some chest pain with minimal activity on heparin and NTG so will keep NPO after MN. I discussed this with her and she understands.

## 2020-05-15 NOTE — Consult Note (Signed)
[address]       [place] [address]             [phone]        [place] Medical Record #[id number] Date of Birth: [birth year]  Referring: No ref. provider found Primary Care: Almira Bar, MD Primary Cardiologist:No primary care provider on file.  Chief Complaint:    Chief Complaint  Patient presents with  . Chest Pain    History of Present Illness:    The patient is a 75 year old female we are asked to see in cardiothoracic surgical consultation for consideration of coronary artery surgical revascularization.  She has m cardiac risk factors including hyperlipidemia, hypertension and diabetes mellitus.  She presented to the emergency department at [facility].  On 05/13/2020 with a approximate history of 7 to 10 days of intermittent chest pain which was worse on date of presentation.  Pain was described as midsternal with some radiation to the left side and felt like a pressure like/burning sensation.  She noted the symptoms were worse with exertion and was also noted to be associated with shortness of breath.  Initial  HStroponin I was 41.  EKG showed anterior and lateral T wave inversions with no previous tracing to compare..  Cardiology consultation was obtained due to her symptoms being consistent with accelerating angina.  The patient was started on a heparin drip as well as beta-blocker, ARB and aspirin.  She is noted to be statin intolerant per history but was started on low-dose Crestor.  The patient was transferred to [facility] for cardiac catheterization.  This was subsequently performed on 05/14/2020 and the full report is listed below.  She is found to have severe three-vessel coronary artery disease.  LVEDP was measured at 7 mmHg.  Echocardiogram shows left ventricular ejection fraction by estimation to be 65 to 70%.  There is no significant valvular disease.  Please see the full report below.  She has a remote history of  smoking.  He smoked for approximately 20 years but quit 30 years ago.    Current Activity/ Functional Status: Patient is independent with mobility/ambulation, transfers, ADL's, IADL's.   Zubrod Score: At the time of surgery this patient's most appropriate activity status/level should be described as: []     0    Normal activity, no symptoms [x]     1    Restricted in physical strenuous activity but ambulatory, able to do out light work []     2    Ambulatory and capable of self care, unable to do work activities, up and about                 more than 50%  Of the time                            []     3    Only limited self care, in bed greater than 50% of waking hours []     4    Completely disabled, no self care, confined to bed or chair []     5    Moribund  Past Medical History:  Diagnosis Date  . Diabetes mellitus without complication ([place])   . Hyperlipidemia   . Thyroid disease     Past Surgical History:  Procedure Laterality Date  . ABDOMINAL HYSTERECTOMY    . APPENDECTOMY    . LEFT HEART CATH AND CORONARY  ANGIOGRAPHY N/A 05/14/2020   Procedure: LEFT HEART CATH AND CORONARY ANGIOGRAPHY;  Surgeon: Troy Sine, MD;  Location: [facility];  Service: Cardiovascular;  Laterality: N/A;  . TONSILLECTOMY      Social History   Tobacco Use  Smoking Status Former Smoker  Smokeless Tobacco Never Used    Social History   Substance and Sexual Activity  Alcohol Use Not Currently     Allergies  Allergen Reactions  . Statins Other (See Comments)    Severe Body aches     Current Facility-Administered Medications  Medication Dose Route Frequency Provider Last Rate Last Admin  . 0.9 %  sodium chloride infusion  250 mL Intravenous PRN Shelva Majestic A, MD      . 0.9 %  sodium chloride infusion   Intravenous Continuous Debbe Odea, MD 50 mL/hr at 05/15/20 0437 New Bag at 05/15/20 0437  . acetaminophen (TYLENOL) tablet 650 mg  650 mg Oral Q4H PRN Troy Sine, MD       . aspirin chewable tablet 81 mg  81 mg Oral Daily Troy Sine, MD      . cholecalciferol (VITAMIN D3) tablet 1,000 Units  1,000 Units Oral Daily Adefeso, Oladapo, DO   1,000 Units at 05/14/20 0906  . diazepam (VALIUM) tablet 5 mg  5 mg Oral Q6H PRN Troy Sine, MD      . ezetimibe (ZETIA) tablet 10 mg  10 mg Oral Daily Troy Sine, MD   10 mg at 05/14/20 1731  . heparin ADULT infusion 100 units/mL (25000 units/255mL)  900 Units/hr Intravenous Continuous Debbe Odea, MD 9 mL/hr at 05/15/20 0110 900 Units/hr at 05/15/20 0110  . insulin aspart (novoLOG) injection 0-5 Units  0-5 Units Subcutaneous QHS Adefeso, Oladapo, DO   2 Units at 05/13/20 2203  . insulin aspart (novoLOG) injection 0-9 Units  0-9 Units Subcutaneous TID WC Adefeso, Oladapo, DO   1 Units at 05/15/20 0641  . insulin glargine (LANTUS) injection 10 Units  10 Units Subcutaneous QHS Debbe Odea, MD   10 Units at 05/14/20 2125  . levothyroxine (SYNTHROID) tablet 75 mcg  75 mcg Oral Q0600 Adefeso, Oladapo, DO   75 mcg at 05/15/20 0637  . losartan (COZAAR) tablet 12.5 mg  12.5 mg Oral Daily Emokpae, Courage, MD   12.5 mg at 05/14/20 1634  . metoprolol tartrate (LOPRESSOR) tablet 12.5 mg  12.5 mg Oral BID Arnoldo Lenis, MD   12.5 mg at 05/14/20 2125  . nitroGLYCERIN (NITROGLYN) 2 % ointment 1 inch  1 inch Topical Once Adefeso, Oladapo, DO      . nitroGLYCERIN 50 mg in dextrose 5 % 250 mL (0.2 mg/mL) infusion  0-200 mcg/min Intravenous Titrated Troy Sine, MD      . omega-3 acid ethyl esters (LOVAZA) capsule 1 g  1 g Oral Daily Adefeso, Oladapo, DO   1 g at 05/14/20 0907  . ondansetron (ZOFRAN) injection 4 mg  4 mg Intravenous Q6H PRN Troy Sine, MD      . rosuvastatin (CRESTOR) tablet 10 mg  10 mg Oral q1800 Erma Heritage, PA-C   10 mg at 05/14/20 1736  . sodium chloride flush (NS) 0.9 % injection 3 mL  3 mL Intravenous Q12H Shelva Majestic A, MD      . sodium chloride flush (NS) 0.9 % injection 3 mL  3 mL  Intravenous PRN Troy Sine, MD      . vitamin B-12 (CYANOCOBALAMIN) tablet 1,000  mcg  1,000 mcg Oral Daily Adefeso, Oladapo, DO   1,000 mcg at 05/14/20 0907    Medications Prior to Admission  Medication Sig Dispense Refill Last Dose  . aspirin EC 325 MG tablet Take 650 mg by mouth daily.   05/12/2020 at Unknown time  . Cholecalciferol 25 MCG (1000 UT) capsule Take 1,000 Units by mouth daily.   05/12/2020 at Unknown time  . cyanocobalamin 1000 MCG tablet Take 1,000 mcg by mouth daily.   05/12/2020 at Unknown time  . insulin aspart protamine - aspart (NOVOLOG MIX 70/30 FLEXPEN) (70-30) 100 UNIT/ML FlexPen Inject 18 Units into the skin 2 (two) times daily with a meal.   05/12/2020 at Unknown time  . levothyroxine (SYNTHROID) 75 MCG tablet Take 75 mcg by mouth daily before breakfast.   05/12/2020 at Unknown time  . metFORMIN (GLUCOPHAGE) 1000 MG tablet Take 1 tablet by mouth 2 (two) times daily.   05/12/2020 at Unknown time  . Omega-3 Fatty Acids (FISH OIL) 1000 MG CAPS Take 1 capsule by mouth daily.   05/12/2020 at Unknown time    Family History  Problem Relation Age of Onset  . CAD Father   . CAD Brother   . CAD Paternal Aunt      Review of Systems:   Review of Systems  Constitutional: Positive for chills and malaise/fatigue. Negative for diaphoresis, fever and weight loss.  HENT: Negative for congestion, ear discharge, ear pain, hearing loss, nosebleeds, sinus pain, sore throat and tinnitus.   Eyes: Negative for blurred vision, double vision, photophobia, pain, discharge and redness.  Respiratory: Positive for shortness of breath. Negative for cough, hemoptysis, sputum production, wheezing and stridor.   Cardiovascular: Positive for chest pain, palpitations, orthopnea and PND. Negative for claudication and leg swelling.  Gastrointestinal: Positive for heartburn. Negative for abdominal pain, blood in stool, constipation, diarrhea, nausea and vomiting.  Genitourinary: Negative.    Musculoskeletal: Negative.   Skin: Negative.   Neurological: Negative.   Endo/Heme/Allergies: Positive for environmental allergies and polydipsia. Bruises/bleeds easily.  Psychiatric/Behavioral: Negative.       Physical Exam: BP (!) 135/55 (BP Location: Left Arm)   Pulse 73   Temp 98.1 F (36.7 C) (Oral)   Resp 17   Ht 5' 2.5" (1.588 m)   Wt 67.4 kg   SpO2 99%   BMI 26.75 kg/m   Physical Exam  Constitutional: No distress.  HENT:  Mouth/Throat: Dental caries present. Oropharynx is clear.  Eyes: Pupils are equal, round, and reactive to light. Conjunctivae are normal.  Neck: Thyroid normal. No JVD present. No neck adenopathy. No thyromegaly present.  Cardiovascular: Normal rate, regular rhythm, S1 normal, S2 normal, normal heart sounds, intact distal pulses and normal pulses. Exam reveals no gallop.  No murmur heard. Soft bilateral carotid bruits  Pulmonary/Chest: Breath sounds normal. She has no wheezes. She has no rales. She exhibits no tenderness.  Abdominal: Soft. Bowel sounds are normal. She exhibits no distension and no mass. There is no hepatomegaly. There is no abdominal tenderness.  Musculoskeletal:        General: No tenderness, deformity or edema.     Cervical back: Normal range of motion and neck supple.  Neurological: She is alert and oriented to person, place, and time. She has normal motor skills.  Skin: Skin is warm and dry. No rash noted. No cyanosis. No jaundice or pallor. Nails show no clubbing.     Diagnostic Studies & Laboratory data:     Recent Radiology Findings:  CARDIAC CATHETERIZATION  Result Date: 05/14/2020  Ost LAD to Prox LAD lesion is 95% stenosed.  Ost Cx to Prox Cx lesion is 80% stenosed.  Ost RCA lesion is 70% stenosed.  Prox RCA lesion is 70% stenosed.  Prox RCA to Mid RCA lesion is 50% stenosed.  Mid RCA lesion is 75% stenosed.  Dist RCA lesion is 90% stenosed.  3rd RPL lesion is 95% stenosed.  Ost LM to Dist LM lesion is 10%  stenosed.  There is significant coronary calcification with severe three-vessel coronary obstructive disease.  There is 95% ostial LAD stenosis, 70 to 80% ostial circumflex stenosis and diffuse stenoses in a calcified RCA with 70% ostial 70, 50 and 75% mid stenoses, distal 90% stenosis and 95% PLA stenosis. LVEDP 7 mmHg. RECOMMENDATION: Surgical consultation for CABG revascularization.  The patient was started on IV nitroglycerin during the procedure.  She will be started on heparin therapy 8 hours post TR band.  There is a reported statin allergy.  Zetia will be started; she may be a candidate for PCSK9 inhibition.   ECHOCARDIOGRAM COMPLETE  Result Date: 05/13/2020    ECHOCARDIOGRAM REPORT   Patient Name:   LACHELE LIEVANOS Date of Exam: 05/13/2020 Medical Rec #:  [id number]     Height:       62.5 in Accession #:    [id number]    Weight:       147.0 lb Date of Birth:  1945/09/18      BSA:          1.687 m Patient Age:    6 years      BP:           142/58 mmHg Patient Gender: F             HR:           75 bpm. Exam Location:  [facility] Procedure: 2D Echo, Cardiac Doppler and Color Doppler Indications:    Chest Pain R07.9  History:        Patient has no prior history of Echocardiogram examinations.                 Risk Factors:Hypertension, Diabetes and Dyslipidemia. Elevated                 troponin I level, Chronic kidney disease, stage 3a ([place]).  Sonographer:    Alvino Chapel RCS Referring Phys: [phone] COURAGE EMOKPAE IMPRESSIONS  1. Left ventricular ejection fraction, by estimation, is 65 to 70%. The left ventricle has normal function. The left ventricle has no regional wall motion abnormalities. There is mild left ventricular hypertrophy. Left ventricular diastolic parameters are consistent with Grade I diastolic dysfunction (impaired relaxation). Elevated left atrial pressure.  2. Right ventricular systolic function is normal. The right ventricular size is normal.  3. The mitral valve is normal in structure.  No evidence of mitral valve regurgitation. No evidence of mitral stenosis.  4. The aortic valve is tricuspid. There is mild calcification of the aortic valve. There is mild thickening of the aortic valve. Aortic valve regurgitation is mild. No aortic stenosis is present.  5. The inferior vena cava is normal in size with greater than 50% respiratory variability, suggesting right atrial pressure of 3 mmHg. FINDINGS  Left Ventricle: Left ventricular ejection fraction, by estimation, is 65 to 70%. The left ventricle has normal function. The left ventricle has no regional wall motion abnormalities. The left ventricular internal cavity size was normal in size. There is  mild left ventricular hypertrophy. Left ventricular diastolic parameters are consistent with Grade I diastolic dysfunction (impaired relaxation). Elevated left atrial pressure. Right Ventricle: The right ventricular size is normal. No increase in right ventricular wall thickness. Right ventricular systolic function is normal. Left Atrium: Left atrial size was normal in size. Right Atrium: Right atrial size was normal in size. Pericardium: The pericardium was not well visualized. Mitral Valve: The mitral valve is normal in structure. There is mild thickening of the mitral valve leaflet(s). There is mild calcification of the mitral valve leaflet(s). Mild mitral annular calcification. No evidence of mitral valve regurgitation. No evidence of mitral valve stenosis. Tricuspid Valve: The tricuspid valve is normal in structure. Tricuspid valve regurgitation is not demonstrated. No evidence of tricuspid stenosis. Aortic Valve: The aortic valve is tricuspid. There is mild calcification of the aortic valve. There is mild thickening of the aortic valve. There is mild aortic valve annular calcification. Aortic valve regurgitation is mild. No aortic stenosis is present. Aortic valve mean gradient measures 3.0 mmHg. Aortic valve peak gradient measures 5.3 mmHg. Aortic  valve area, by VTI measures 2.50 cm. Pulmonic Valve: The pulmonic valve was not well visualized. Pulmonic valve regurgitation is mild. No evidence of pulmonic stenosis. Aorta: The aortic root is normal in size and structure. Pulmonary Artery: Indeterminant PASP, inadequate TR jet. Venous: The inferior vena cava is normal in size with greater than 50% respiratory variability, suggesting right atrial pressure of 3 mmHg. IAS/Shunts: No atrial level shunt detected by color flow Doppler.  LEFT VENTRICLE PLAX 2D LVIDd:         4.20 cm  Diastology LVIDs:         2.30 cm  LV e' medial:    5.11 cm/s LV PW:         1.10 cm  LV E/e' medial:  15.3 LV IVS:        1.00 cm  LV e' lateral:   4.57 cm/s LVOT diam:     1.80 cm  LV E/e' lateral: 17.1 LV SV:         61 LV SV Index:   36 LVOT Area:     2.54 cm  RIGHT VENTRICLE RV S prime:     14.40 cm/s TAPSE (M-mode): 2.1 cm LEFT ATRIUM             Index       RIGHT ATRIUM           Index LA diam:        2.60 cm 1.54 cm/m  RA Area:     11.00 cm LA Vol (A2C):   35.3 ml 20.92 ml/m RA Volume:   23.70 ml  14.05 ml/m LA Vol (A4C):   37.4 ml 22.17 ml/m LA Biplane Vol: 39.3 ml 23.29 ml/m  AORTIC VALVE AV Area (Vmax):    2.40 cm AV Area (Vmean):   2.10 cm AV Area (VTI):     2.50 cm AV Vmax:           115.54 cm/s AV Vmean:          82.829 cm/s AV VTI:            0.242 m AV Peak Grad:      5.3 mmHg AV Mean Grad:      3.0 mmHg LVOT Vmax:         109.00 cm/s LVOT Vmean:        68.500 cm/s LVOT VTI:  0.238 m LVOT/AV VTI ratio: 0.98  AORTA Ao Root diam: 2.90 cm MITRAL VALVE MV Area (PHT): 3.08 cm     SHUNTS MV Decel Time: 246 msec     Systemic VTI:  0.24 m MV E velocity: 78.10 cm/s   Systemic Diam: 1.80 cm MV A velocity: 111.00 cm/s MV E/A ratio:  0.70 Carlyle Dolly MD Electronically signed by Carlyle Dolly MD Signature Date/Time: 05/13/2020/3:21:19 PM    Final      I have independently reviewed the above radiologic studies and discussed with the patient   Recent Lab  Findings: Lab Results  Component Value Date   WBC 8.5 05/15/2020   HGB 11.0 (L) 05/15/2020   HCT 33.5 (L) 05/15/2020   PLT 273 05/15/2020   GLUCOSE 159 (H) 05/15/2020   CHOL 188 05/14/2020   TRIG 228 (H) 05/14/2020   HDL 36 (L) 05/14/2020   LDLCALC 106 (H) 05/14/2020   ALT 16 05/13/2020   AST 16 05/13/2020   NA 138 05/15/2020   K 3.5 05/15/2020   CL 108 05/15/2020   CREATININE 1.09 (H) 05/15/2020   BUN 16 05/15/2020   CO2 21 (L) 05/15/2020   INR 1.0 05/13/2020   HGBA1C 7.8 (H) 05/13/2020      Assessment / Plan:      Severe three-vessel coronary artery disease with accelerating angina. Hyperlipidemia with statin intolerance Diabetes mellitus type 2 currently on oral agents and insulin .  Hemoglobin A1c 7.8 Hypertension Positive family history of coronary disease Hypothyroidism   Plan for CABG 05/16/2020 by Dr. Julien Girt.  She is currently getting preoperative testing including carotid duplex .  I  spent 40 minutes counseling the patient face to face.   John Giovanni, PA-C 05/15/2020 9:32 AM

## 2020-05-15 NOTE — Progress Notes (Signed)
ANTICOAGULATION CONSULT NOTE - Follow Up Consult  Pharmacy Consult for IV Heparin Indication: chest pain/ACS  Allergies  Allergen Reactions  . Statins Other (See Comments)    Severe Body aches     Patient Measurements: Height: 5' 2.5" (158.8 cm) Weight: 67.4 kg (148 lb 9.6 oz) IBW/kg (Calculated) : 51.25 Heparin Dosing Weight: 65 kg  Vital Signs: Temp: 97.9 F (36.6 C) (04/14 2016) Temp Source: Oral (04/14 2016) BP: 163/52 (04/14 2016) Pulse Rate: 70 (04/14 2016)  Labs: Recent Labs     0000 05/12/20 2147 05/13/20 0554 05/13/20 2043 05/13/20 2043 05/13/20 2241 05/14/20 0456 05/15/20 0350 05/15/20 0809 05/15/20 1930  HGB   < >  --  11.6*  --   --   --  11.8* 11.0*  --   --   HCT  --   --  36.5  --   --   --  36.1 33.5*  --   --   PLT  --   --  315  --   --   --  299 273  --   --   APTT  --   --  28  --   --   --   --   --   --   --   LABPROT  --   --  13.2  --   --   --   --   --   --   --   INR  --   --  1.0  --   --   --   --   --   --   --   HEPARINUNFRC  --   --   --  0.36   < >  --  0.19*  --  0.23* 0.34  CREATININE  --   --  1.08*  --   --   --   --  1.09*  --   --   TROPONINIHS  --  33*  --  10  --  16  --   --   --   --    < > = values in this interval not displayed.    Estimated Creatinine Clearance: 40.6 mL/min (A) (by C-G formula based on SCr of 1.09 mg/dL (H)).  Assessment: Pharmacy was consulted to dose heparin in this 75 yr old woman with chest pain/ACS.  4/13: cath showed multivessel CAD; pt to have CABG on 4/15, timing TBD.  Heparin level ~9.5 hrs after heparin infusion was increased to 1050 units/hr was 0.34 units/ml, which is at the low end of the goal range for this pt. H/H 11.0/33.5, plt 273. Per RN, no issues with IV or bleeding observed.  Goal of Therapy:  Heparin level 0.3-0.7 units/ml Monitor platelets by anticoagulation protocol: Yes   Plan:  Increase heparin infusion to 1100 units/hr  Check heparin level in 8 hrs Monitor daily  CBC, heparin, and for s/sx of bleeding  F/U after CABG  Thank you for allowing [place] to participate in this patients care.  Gillermina Hu, PharmD, BCPS, [facility] Clinical Pharmacist 05/15/2020 8:22 PM   Please check AMION for all [place] phone numbers After 10:00 PM, call [place] [phone]

## 2020-05-15 NOTE — Progress Notes (Addendum)
Progress Note  Patient Name: Tiffany White Date of Encounter: 05/15/2020  Primary Cardiologist: New to [place]  Subjective   No chest pain overnight. No muscle pain. She is having difficulty sleeping due to infusions and pump alarms waking her up.   Inpatient Medications    Scheduled Meds: . aspirin  81 mg Oral Daily  . cholecalciferol  1,000 Units Oral Daily  . ezetimibe  10 mg Oral Daily  . insulin aspart  0-5 Units Subcutaneous QHS  . insulin aspart  0-9 Units Subcutaneous TID WC  . insulin glargine  10 Units Subcutaneous QHS  . levothyroxine  75 mcg Oral Q0600  . losartan  12.5 mg Oral Daily  . metoprolol tartrate  12.5 mg Oral BID  . nitroGLYCERIN  1 inch Topical Once  . omega-3 acid ethyl esters  1 g Oral Daily  . rosuvastatin  10 mg Oral q1800  . sodium chloride flush  3 mL Intravenous Q12H  . cyanocobalamin  1,000 mcg Oral Daily   Continuous Infusions: . sodium chloride    . sodium chloride 50 mL/hr at 05/15/20 0437  . heparin 900 Units/hr (05/15/20 0110)  . nitroGLYCERIN     PRN Meds: sodium chloride, acetaminophen, diazepam, ondansetron (ZOFRAN) IV, sodium chloride flush   Vital Signs    Vitals:   05/14/20 1610 05/14/20 1700 05/15/20 0448 05/15/20 0658  BP: (!) 132/59 (!) 136/56 (!) 135/55   Pulse: 66 65 73   Resp: 17 13 17    Temp:   98.1 F (36.7 C)   TempSrc:   Oral   SpO2: 98% 98% 99%   Weight:    67.4 kg  Height:        Intake/Output Summary (Last 24 hours) at 05/15/2020 0732 Last data filed at 05/15/2020 0658 Gross per 24 hour  Intake 973.31 ml  Output 1850 ml  Net -876.69 ml   Filed Weights   05/12/20 1627 05/14/20 0236 05/15/20 0658  Weight: 66.7 kg 66.5 kg 67.4 kg    Telemetry     NSR - Personally Reviewed  ECG    NSR , 69/min PR .16 QRS .76 QT nl for rate, nl axis. Nl ECG - Personally Reviewed  Physical Exam   GEN: No acute distress.  Well appearing Neck: No JVD Cardiac: RRR, no murmurs, rubs, or gallops.   Respiratory: Normal work of breathing. No adventitious breath sounds  GI: Soft, nontender, non-distended  MS: No edema; No deformity. Neuro:  Nonfocal , AOx4 Psych: Normal affect , normal mood  Labs    Chemistry Recent Labs  Lab 05/12/20 1959 05/13/20 0554 05/15/20 0350  NA 139 140 138  K 4.1 4.1 3.5  CL 105 106 108  CO2 23 24 21*  GLUCOSE 150* 129* 159*  BUN 24* 21 16  CREATININE 1.13* 1.08* 1.09*  CALCIUM 9.8 9.5 8.8*  PROT  --  7.4  --   ALBUMIN  --  3.9  --   AST  --  16  --   ALT  --  16  --   ALKPHOS  --  68  --   BILITOT  --  0.6  --   GFRNONAA 51* 54* 53*  ANIONGAP 11 10 9      Hematology Recent Labs  Lab 05/13/20 0554 05/14/20 0456 05/15/20 0350  WBC 10.5 8.0 8.5  RBC 3.97 3.97 3.71*  HGB 11.6* 11.8* 11.0*  HCT 36.5 36.1 33.5*  MCV 91.9 90.9 90.3  MCH 29.2 29.7 29.6  MCHC 31.8 32.7 32.8  RDW 13.3 13.2 13.2  PLT 315 299 273    Cardiac EnzymesNo results for input(s): TROPONINI in the last 168 hours. No results for input(s): TROPIPOC in the last 168 hours.   BNPNo results for input(s): BNP, PROBNP in the last 168 hours.   DDimer No results for input(s): DDIMER in the last 168 hours.   Radiology    CARDIAC CATHETERIZATION  Result Date: 05/14/2020  Ost LAD to Prox LAD lesion is 95% stenosed.  Ost Cx to Prox Cx lesion is 80% stenosed.  Ost RCA lesion is 70% stenosed.  Prox RCA lesion is 70% stenosed.  Prox RCA to Mid RCA lesion is 50% stenosed.  Mid RCA lesion is 75% stenosed.  Dist RCA lesion is 90% stenosed.  3rd RPL lesion is 95% stenosed.  Ost LM to Dist LM lesion is 10% stenosed.  There is significant coronary calcification with severe three-vessel coronary obstructive disease.  There is 95% ostial LAD stenosis, 70 to 80% ostial circumflex stenosis and diffuse stenoses in a calcified RCA with 70% ostial 70, 50 and 75% mid stenoses, distal 90% stenosis and 95% PLA stenosis. LVEDP 7 mmHg. RECOMMENDATION: Surgical consultation for CABG  revascularization.  The patient was started on IV nitroglycerin during the procedure.  She will be started on heparin therapy 8 hours post TR band.  There is a reported statin allergy.  Zetia will be started; she may be a candidate for PCSK9 inhibition.   ECHOCARDIOGRAM COMPLETE  Result Date: 05/13/2020    ECHOCARDIOGRAM REPORT   Patient Name:   NAVA SONG Date of Exam: 05/13/2020 Medical Rec #:  [id number]     Height:       62.5 in Accession #:    [id number]    Weight:       147.0 lb Date of Birth:  23-Nov-1945      BSA:          1.687 m Patient Age:    75 years      BP:           142/58 mmHg Patient Gender: F             HR:           75 bpm. Exam Location:  [facility] Procedure: 2D Echo, Cardiac Doppler and Color Doppler Indications:    Chest Pain R07.9  History:        Patient has no prior history of Echocardiogram examinations.                 Risk Factors:Hypertension, Diabetes and Dyslipidemia. Elevated                 troponin I level, Chronic kidney disease, stage 3a ([place]).  Sonographer:    Alvino Chapel RCS Referring Phys: [phone] COURAGE EMOKPAE IMPRESSIONS  1. Left ventricular ejection fraction, by estimation, is 65 to 70%. The left ventricle has normal function. The left ventricle has no regional wall motion abnormalities. There is mild left ventricular hypertrophy. Left ventricular diastolic parameters are consistent with Grade I diastolic dysfunction (impaired relaxation). Elevated left atrial pressure.  2. Right ventricular systolic function is normal. The right ventricular size is normal.  3. The mitral valve is normal in structure. No evidence of mitral valve regurgitation. No evidence of mitral stenosis.  4. The aortic valve is tricuspid. There is mild calcification of the aortic valve. There is mild thickening of the aortic valve. Aortic valve regurgitation is mild. No  aortic stenosis is present.  5. The inferior vena cava is normal in size with greater than 50% respiratory variability,  suggesting right atrial pressure of 3 mmHg. FINDINGS  Left Ventricle: Left ventricular ejection fraction, by estimation, is 65 to 70%. The left ventricle has normal function. The left ventricle has no regional wall motion abnormalities. The left ventricular internal cavity size was normal in size. There is  mild left ventricular hypertrophy. Left ventricular diastolic parameters are consistent with Grade I diastolic dysfunction (impaired relaxation). Elevated left atrial pressure. Right Ventricle: The right ventricular size is normal. No increase in right ventricular wall thickness. Right ventricular systolic function is normal. Left Atrium: Left atrial size was normal in size. Right Atrium: Right atrial size was normal in size. Pericardium: The pericardium was not well visualized. Mitral Valve: The mitral valve is normal in structure. There is mild thickening of the mitral valve leaflet(s). There is mild calcification of the mitral valve leaflet(s). Mild mitral annular calcification. No evidence of mitral valve regurgitation. No evidence of mitral valve stenosis. Tricuspid Valve: The tricuspid valve is normal in structure. Tricuspid valve regurgitation is not demonstrated. No evidence of tricuspid stenosis. Aortic Valve: The aortic valve is tricuspid. There is mild calcification of the aortic valve. There is mild thickening of the aortic valve. There is mild aortic valve annular calcification. Aortic valve regurgitation is mild. No aortic stenosis is present. Aortic valve mean gradient measures 3.0 mmHg. Aortic valve peak gradient measures 5.3 mmHg. Aortic valve area, by VTI measures 2.50 cm. Pulmonic Valve: The pulmonic valve was not well visualized. Pulmonic valve regurgitation is mild. No evidence of pulmonic stenosis. Aorta: The aortic root is normal in size and structure. Pulmonary Artery: Indeterminant PASP, inadequate TR jet. Venous: The inferior vena cava is normal in size with greater than 50% respiratory  variability, suggesting right atrial pressure of 3 mmHg. IAS/Shunts: No atrial level shunt detected by color flow Doppler.  LEFT VENTRICLE PLAX 2D LVIDd:         4.20 cm  Diastology LVIDs:         2.30 cm  LV e' medial:    5.11 cm/s LV PW:         1.10 cm  LV E/e' medial:  15.3 LV IVS:        1.00 cm  LV e' lateral:   4.57 cm/s LVOT diam:     1.80 cm  LV E/e' lateral: 17.1 LV SV:         61 LV SV Index:   36 LVOT Area:     2.54 cm  RIGHT VENTRICLE RV S prime:     14.40 cm/s TAPSE (M-mode): 2.1 cm LEFT ATRIUM             Index       RIGHT ATRIUM           Index LA diam:        2.60 cm 1.54 cm/m  RA Area:     11.00 cm LA Vol (A2C):   35.3 ml 20.92 ml/m RA Volume:   23.70 ml  14.05 ml/m LA Vol (A4C):   37.4 ml 22.17 ml/m LA Biplane Vol: 39.3 ml 23.29 ml/m  AORTIC VALVE AV Area (Vmax):    2.40 cm AV Area (Vmean):   2.10 cm AV Area (VTI):     2.50 cm AV Vmax:           115.54 cm/s AV Vmean:  82.829 cm/s AV VTI:            0.242 m AV Peak Grad:      5.3 mmHg AV Mean Grad:      3.0 mmHg LVOT Vmax:         109.00 cm/s LVOT Vmean:        68.500 cm/s LVOT VTI:          0.238 m LVOT/AV VTI ratio: 0.98  AORTA Ao Root diam: 2.90 cm MITRAL VALVE MV Area (PHT): 3.08 cm     SHUNTS MV Decel Time: 246 msec     Systemic VTI:  0.24 m MV E velocity: 78.10 cm/s   Systemic Diam: 1.80 cm MV A velocity: 111.00 cm/s MV E/A ratio:  0.70 Carlyle Dolly MD Electronically signed by Carlyle Dolly MD Signature Date/Time: 05/13/2020/3:21:19 PM    Final     Cardiac Studies   Left Heart Cath and Coronary Angiography 05/14/2020: There is significant coronary calcification with severe three-vessel coronary obstructive disease.  There is 95% ostial LAD stenosis, 70 to 80% ostial circumflex stenosis and diffuse stenoses in a calcified RCA with 70% ostial 70, 50 and 75% mid stenoses, distal 90% stenosis and 95% PLA stenosis.   Patient Profile     75 y.o. 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   Assessment & Plan    1. CAD, Severe three-vessel coronary obstructive disease Presented with 1 year history of worsening dyspnea on exertion has experienced chest pressure/burning with activity for the past 2 to 3 weeks. She is now experiencing chest pain with minimal exertion. Cardiac Risk factors of HLD, T2D, family history of CAD, and hx of tobacco use. Echo on 05/13/2020 EF 65-70%, with no regional wall motion abnormalities. LHC on 05/14/2020 showing severe three vessel coronary obstructive disease.  - Continue Heparin - Continue Nitroglycerin infusion  - Asprin 81 mg, Metoprolol 12.5 mg BID, Crestor 10 mg daily , Zetia 10 mg daily, Losartan 12.5 daily - TCTS consulted for CABG evaluation.  2. HLD  HDL 36, LDL 106. Patient had myalgias when taking Lipitor in the past.  - Continue Crestor and Zetia  3. Type 2 Diabetes Mellitus - On Lantus 10 units nightly and SSI-S, HS correction - Per primary   For questions or updates, please contact [place] Please consult [email or web address] for contact info under Cardiology/STEMI.      Signed, Lorene Dy, MD  PGY2 IM 05/15/2020, 7:32 AM     Patient seen and examined.  Agree with above documentation.  On exam, patient is alert and oriented, regular rate and rhythm, no murmurs, lungs CTAB, no LE edema or JVD.  Cath yesterday showed severe multivessel disease.  Had episode of chest pain this morning when walking to the bathroom.  Currently chest pain-free.  We will continue aspirin, heparin drip.  Continue nitroglycerin drip.  Cardiac surgery consulted for CABG evaluation.  Donato Heinz, MD

## 2020-05-15 NOTE — Progress Notes (Signed)
[id number] Discussed with pt and daughter importance of IS and mobility for pulmonary improvement. Gave IS and pt demonstrated 1500 ml correctly. Discussed staying in the tube (handout given) and sternal precautions. Wrote down how to view pre op video. Gave [facility] booklet, care guide. Daughter will be available to assist in care after discharge.  Will follow up after surgery. Graylon Good RN BSN 05/15/2020 1:11 PM

## 2020-05-15 NOTE — Progress Notes (Signed)
PROGRESS NOTE    Tiffany White   QQI:[id number]  DOB: 1945-12-22  DOA: 05/12/2020 PCP: Almira Bar, MD   Brief Narrative:  Tiffany White 75-year-old female with hypertension, diabetes mellitus type 2, obesity, hypothyroidism who presented to the hospital for left-sided chest pain that felt like pressure or burning associated with shortness of breath.  Card in the hospital when she was ambulating to the bathroom and she was given nitroglycerin for it.   Subjective: She is still having chest pain when she ambulates to the bathroom. Now has a bedside commode. No other complaint. Admits to being constipated.     Assessment & Plan:   Principal Problem: Angina -Placed on heparin -Cardiac cath today reveals severe coronary artery disease-CT surgery consult for CABG  -The echo reveals grade 1 diastolic heart failure - Aspirin, metoprolol, Zetia, Crestor, heparin -Nitroglycerin infusion being started 4/13 - CABG planned for tomorrow  Active Problems: Dyslipidemia -LDL 36, LDL 106, triglycerides 228 -Continue Crestor and Zetia    Essential hypertension -Has been started on losartan, metoprolol and nitroglycerin infusion for above -BP stable    Hypothyroidism -Continue Synthroid    Hyperglycemia due to diabetes mellitus 2 - A1c on 05/13/20 7.8 -Continue sliding scale insulin-Metformin and NovoLog 70/30 on hold -Started Lantus 10 units nightly   Vitamin D and vitamin B12 deficiency -Continue replacement  Hypokalemia - have ordered replacement  Constipation - Follow- will order Miralax PRN   Time spent in minutes: 35 DVT prophylaxis: Heparin infusion Code Status: Full code Family Communication:  Level of Care: Level of care: Telemetry Cardiac Disposition Plan:  Status is: Inpatient   Dispo: The patient is from: Home              Anticipated d/c is to: Home              Patient currently is not medically stable to d/c.   Difficult to place patient  No   Consultants:   Cardiology  CT surgery Procedures:   Cath Antimicrobials:  Anti-infectives (From admission, onward)   None       Objective: Vitals:   05/14/20 1610 05/14/20 1700 05/15/20 0448 05/15/20 0658  BP: (!) 132/59 (!) 136/56 (!) 135/55   Pulse: 66 65 73   Resp: 17 13 17    Temp:   98.1 F (36.7 C)   TempSrc:   Oral   SpO2: 98% 98% 99%   Weight:    67.4 kg  Height:        Intake/Output Summary (Last 24 hours) at 05/15/2020 0728 Last data filed at 05/15/2020 0658 Gross per 24 hour  Intake 973.31 ml  Output 1850 ml  Net -876.69 ml   Filed Weights   05/12/20 1627 05/14/20 0236 05/15/20 0658  Weight: 66.7 kg 66.5 kg 67.4 kg    Examination: General exam: Appears comfortable  HEENT: PERRLA, oral mucosa moist, no sclera icterus or thrush Respiratory system: Clear to auscultation. Respiratory effort normal. Cardiovascular system: S1 & S2 heard, regular rate and rhythm Gastrointestinal system: Abdomen soft, non-tender, nondistended. Normal bowel sounds   Central nervous system: Alert and oriented. No focal neurological deficits. Extremities: No cyanosis, clubbing or edema Skin: No rashes or ulcers Psychiatry:  Mood & affect appropriate.      Data Reviewed: I have personally reviewed following labs and imaging studies  CBC: Recent Labs  Lab 05/12/20 1959 05/13/20 0554 05/14/20 0456 05/15/20 0350  WBC 9.3 10.5 8.0 8.5  HGB 12.4 11.6* 11.8* 11.0*  HCT  38.5 36.5 36.1 33.5*  MCV 92.3 91.9 90.9 90.3  PLT 338 315 299 616   Basic Metabolic Panel: Recent Labs  Lab 05/12/20 1959 05/13/20 0554 05/15/20 0350  NA 139 140 138  K 4.1 4.1 3.5  CL 105 106 108  CO2 23 24 21*  GLUCOSE 150* 129* 159*  BUN 24* 21 16  CREATININE 1.13* 1.08* 1.09*  CALCIUM 9.8 9.5 8.8*  MG  --  2.0  --   PHOS  --  3.7  --    GFR: Estimated Creatinine Clearance: 40.6 mL/min (A) (by C-G formula based on SCr of 1.09 mg/dL (H)). Liver Function Tests: Recent Labs  Lab  05/13/20 0554  AST 16  ALT 16  ALKPHOS 68  BILITOT 0.6  PROT 7.4  ALBUMIN 3.9   No results for input(s): LIPASE, AMYLASE in the last 168 hours. No results for input(s): AMMONIA in the last 168 hours. Coagulation Profile: Recent Labs  Lab 05/13/20 0554  INR 1.0   Cardiac Enzymes: No results for input(s): CKTOTAL, CKMB, CKMBINDEX, TROPONINI in the last 168 hours. BNP (last 3 results) No results for input(s): PROBNP in the last 8760 hours. HbA1C: Recent Labs    05/13/20 0554  HGBA1C 7.8*   CBG: Recent Labs  Lab 05/14/20 0607 05/14/20 0858 05/14/20 1635 05/14/20 2117 05/15/20 0640  GLUCAP 155* 114* 119* 159* 147*   Lipid Profile: Recent Labs    05/14/20 0456  CHOL 188  HDL 36*  LDLCALC 106*  TRIG 228*  CHOLHDL 5.2   Thyroid Function Tests: No results for input(s): TSH, T4TOTAL, FREET4, T3FREE, THYROIDAB in the last 72 hours. Anemia Panel: No results for input(s): VITAMINB12, FOLATE, FERRITIN, TIBC, IRON, RETICCTPCT in the last 72 hours. Urine analysis: No results found for: COLORURINE, APPEARANCEUR, LABSPEC, PHURINE, GLUCOSEU, HGBUR, BILIRUBINUR, KETONESUR, PROTEINUR, UROBILINOGEN, NITRITE, LEUKOCYTESUR Sepsis Labs: @LABRCNTIP (procalcitonin:4,lacticidven:4) ) Recent Results (from the past 240 hour(s))  Resp Panel by RT-PCR (Flu A&B, Covid) Nasopharyngeal Swab     Status: None   Collection Time: 05/13/20  1:46 AM   Specimen: Nasopharyngeal Swab; Nasopharyngeal(NP) swabs in vial transport medium  Result Value Ref Range Status   SARS Coronavirus 2 by RT PCR NEGATIVE NEGATIVE Final    Comment: (NOTE) SARS-CoV-2 target nucleic acids are NOT DETECTED.  The SARS-CoV-2 RNA is generally detectable in upper respiratory specimens during the acute phase of infection. The lowest concentration of SARS-CoV-2 viral copies this assay can detect is 138 copies/mL. A negative result does not preclude SARS-Cov-2 infection and should not be used as the sole basis for  treatment or other patient management decisions. A negative result may occur with  improper specimen collection/handling, submission of specimen other than nasopharyngeal swab, presence of viral mutation(s) within the areas targeted by this assay, and inadequate number of viral copies(<138 copies/mL). A negative result must be combined with clinical observations, patient history, and epidemiological information. The expected result is Negative.  Fact Sheet for Patients:  EntrepreneurPulse.com.au  Fact Sheet for Healthcare Providers:  IncredibleEmployment.be  This test is no t yet approved or cleared by the [place] FDA and  has been authorized for detection and/or diagnosis of SARS-CoV-2 by FDA under an Emergency Use Authorization (EUA). This EUA will remain  in effect (meaning this test can be used) for the duration of the COVID-19 declaration under Section 564(b)(1) of the Act, 21 U.S.C.section 360bbb-3(b)(1), unless the authorization is terminated  or revoked sooner.       Influenza A by PCR NEGATIVE NEGATIVE  Final   Influenza B by PCR NEGATIVE NEGATIVE Final    Comment: (NOTE) The Xpert Xpress SARS-CoV-2/FLU/RSV plus assay is intended as an aid in the diagnosis of influenza from Nasopharyngeal swab specimens and should not be used as a sole basis for treatment. Nasal washings and aspirates are unacceptable for Xpert Xpress SARS-CoV-2/FLU/RSV testing.  Fact Sheet for Patients: EntrepreneurPulse.com.au  Fact Sheet for Healthcare Providers: IncredibleEmployment.be  This test is not yet approved or cleared by the [place] FDA and has been authorized for detection and/or diagnosis of SARS-CoV-2 by FDA under an Emergency Use Authorization (EUA). This EUA will remain in effect (meaning this test can be used) for the duration of the COVID-19 declaration under Section 564(b)(1) of the Act, 21  U.S.C. section 360bbb-3(b)(1), unless the authorization is terminated or revoked.  Performed at [facility], [address], [place], [place] [address]          Radiology Studies: CARDIAC CATHETERIZATION  Result Date: 05/14/2020  Ost LAD to Prox LAD lesion is 95% stenosed.  Ost Cx to Prox Cx lesion is 80% stenosed.  Ost RCA lesion is 70% stenosed.  Prox RCA lesion is 70% stenosed.  Prox RCA to Mid RCA lesion is 50% stenosed.  Mid RCA lesion is 75% stenosed.  Dist RCA lesion is 90% stenosed.  3rd RPL lesion is 95% stenosed.  Ost LM to Dist LM lesion is 10% stenosed.  There is significant coronary calcification with severe three-vessel coronary obstructive disease.  There is 95% ostial LAD stenosis, 70 to 80% ostial circumflex stenosis and diffuse stenoses in a calcified RCA with 70% ostial 70, 50 and 75% mid stenoses, distal 90% stenosis and 95% PLA stenosis. LVEDP 7 mmHg. RECOMMENDATION: Surgical consultation for CABG revascularization.  The patient was started on IV nitroglycerin during the procedure.  She will be started on heparin therapy 8 hours post TR band.  There is a reported statin allergy.  Zetia will be started; she may be a candidate for PCSK9 inhibition.   ECHOCARDIOGRAM COMPLETE  Result Date: 05/13/2020    ECHOCARDIOGRAM REPORT   Patient Name:   Tiffany White Date of Exam: 05/13/2020 Medical Rec #:  [id number]     Height:       62.5 in Accession #:    [id number]    Weight:       147.0 lb Date of Birth:  Apr 26, 1945      BSA:          1.687 m Patient Age:    63 years      BP:           142/58 mmHg Patient Gender: F             HR:           75 bpm. Exam Location:  [facility] Procedure: 2D Echo, Cardiac Doppler and Color Doppler Indications:    Chest Pain R07.9  History:        Patient has no prior history of Echocardiogram examinations.                 Risk Factors:Hypertension, Diabetes and Dyslipidemia. Elevated                 troponin I level, Chronic kidney disease, stage 3a  ([place]).  Sonographer:    Alvino Chapel RCS Referring Phys: [phone] COURAGE EMOKPAE IMPRESSIONS  1. Left ventricular ejection fraction, by estimation, is 65 to 70%. The left ventricle has normal function. The left ventricle  has no regional wall motion abnormalities. There is mild left ventricular hypertrophy. Left ventricular diastolic parameters are consistent with Grade I diastolic dysfunction (impaired relaxation). Elevated left atrial pressure.  2. Right ventricular systolic function is normal. The right ventricular size is normal.  3. The mitral valve is normal in structure. No evidence of mitral valve regurgitation. No evidence of mitral stenosis.  4. The aortic valve is tricuspid. There is mild calcification of the aortic valve. There is mild thickening of the aortic valve. Aortic valve regurgitation is mild. No aortic stenosis is present.  5. The inferior vena cava is normal in size with greater than 50% respiratory variability, suggesting right atrial pressure of 3 mmHg. FINDINGS  Left Ventricle: Left ventricular ejection fraction, by estimation, is 65 to 70%. The left ventricle has normal function. The left ventricle has no regional wall motion abnormalities. The left ventricular internal cavity size was normal in size. There is  mild left ventricular hypertrophy. Left ventricular diastolic parameters are consistent with Grade I diastolic dysfunction (impaired relaxation). Elevated left atrial pressure. Right Ventricle: The right ventricular size is normal. No increase in right ventricular wall thickness. Right ventricular systolic function is normal. Left Atrium: Left atrial size was normal in size. Right Atrium: Right atrial size was normal in size. Pericardium: The pericardium was not well visualized. Mitral Valve: The mitral valve is normal in structure. There is mild thickening of the mitral valve leaflet(s). There is mild calcification of the mitral valve leaflet(s). Mild mitral annular calcification.  No evidence of mitral valve regurgitation. No evidence of mitral valve stenosis. Tricuspid Valve: The tricuspid valve is normal in structure. Tricuspid valve regurgitation is not demonstrated. No evidence of tricuspid stenosis. Aortic Valve: The aortic valve is tricuspid. There is mild calcification of the aortic valve. There is mild thickening of the aortic valve. There is mild aortic valve annular calcification. Aortic valve regurgitation is mild. No aortic stenosis is present. Aortic valve mean gradient measures 3.0 mmHg. Aortic valve peak gradient measures 5.3 mmHg. Aortic valve area, by VTI measures 2.50 cm. Pulmonic Valve: The pulmonic valve was not well visualized. Pulmonic valve regurgitation is mild. No evidence of pulmonic stenosis. Aorta: The aortic root is normal in size and structure. Pulmonary Artery: Indeterminant PASP, inadequate TR jet. Venous: The inferior vena cava is normal in size with greater than 50% respiratory variability, suggesting right atrial pressure of 3 mmHg. IAS/Shunts: No atrial level shunt detected by color flow Doppler.  LEFT VENTRICLE PLAX 2D LVIDd:         4.20 cm  Diastology LVIDs:         2.30 cm  LV e' medial:    5.11 cm/s LV PW:         1.10 cm  LV E/e' medial:  15.3 LV IVS:        1.00 cm  LV e' lateral:   4.57 cm/s LVOT diam:     1.80 cm  LV E/e' lateral: 17.1 LV SV:         61 LV SV Index:   36 LVOT Area:     2.54 cm  RIGHT VENTRICLE RV S prime:     14.40 cm/s TAPSE (M-mode): 2.1 cm LEFT ATRIUM             Index       RIGHT ATRIUM           Index LA diam:        2.60 cm 1.54 cm/m  RA  Area:     11.00 cm LA Vol (A2C):   35.3 ml 20.92 ml/m RA Volume:   23.70 ml  14.05 ml/m LA Vol (A4C):   37.4 ml 22.17 ml/m LA Biplane Vol: 39.3 ml 23.29 ml/m  AORTIC VALVE AV Area (Vmax):    2.40 cm AV Area (Vmean):   2.10 cm AV Area (VTI):     2.50 cm AV Vmax:           115.54 cm/s AV Vmean:          82.829 cm/s AV VTI:            0.242 m AV Peak Grad:      5.3 mmHg AV Mean Grad:       3.0 mmHg LVOT Vmax:         109.00 cm/s LVOT Vmean:        68.500 cm/s LVOT VTI:          0.238 m LVOT/AV VTI ratio: 0.98  AORTA Ao Root diam: 2.90 cm MITRAL VALVE MV Area (PHT): 3.08 cm     SHUNTS MV Decel Time: 246 msec     Systemic VTI:  0.24 m MV E velocity: 78.10 cm/s   Systemic Diam: 1.80 cm MV A velocity: 111.00 cm/s MV E/A ratio:  0.70 Carlyle Dolly MD Electronically signed by Carlyle Dolly MD Signature Date/Time: 05/13/2020/3:21:19 PM    Final       Scheduled Meds: . aspirin  81 mg Oral Daily  . cholecalciferol  1,000 Units Oral Daily  . ezetimibe  10 mg Oral Daily  . insulin aspart  0-5 Units Subcutaneous QHS  . insulin aspart  0-9 Units Subcutaneous TID WC  . insulin glargine  10 Units Subcutaneous QHS  . levothyroxine  75 mcg Oral Q0600  . losartan  12.5 mg Oral Daily  . metoprolol tartrate  12.5 mg Oral BID  . nitroGLYCERIN  1 inch Topical Once  . omega-3 acid ethyl esters  1 g Oral Daily  . rosuvastatin  10 mg Oral q1800  . sodium chloride flush  3 mL Intravenous Q12H  . cyanocobalamin  1,000 mcg Oral Daily   Continuous Infusions: . sodium chloride    . sodium chloride 50 mL/hr at 05/15/20 0437  . heparin 900 Units/hr (05/15/20 0110)  . nitroGLYCERIN       LOS: 1 day      Debbe Odea, MD [facility] Pager: [email or web address] 05/15/2020, 7:28 AM

## 2020-05-16 ENCOUNTER — Inpatient Hospital Stay (HOSPITAL_COMMUNITY): Payer: Medicare HMO

## 2020-05-16 DIAGNOSIS — I10 Essential (primary) hypertension: Secondary | ICD-10-CM

## 2020-05-16 DIAGNOSIS — I25110 Atherosclerotic heart disease of native coronary artery with unstable angina pectoris: Secondary | ICD-10-CM | POA: Diagnosis not present

## 2020-05-16 LAB — BASIC METABOLIC PANEL
Anion gap: 10 (ref 5–15)
BUN: 16 mg/dL (ref 8–23)
CO2: 19 mmol/L — ABNORMAL LOW (ref 22–32)
Calcium: 9.2 mg/dL (ref 8.9–10.3)
Chloride: 109 mmol/L (ref 98–111)
Creatinine, Ser: 1.02 mg/dL — ABNORMAL HIGH (ref 0.44–1.00)
GFR, Estimated: 57 mL/min — ABNORMAL LOW (ref >60)
Glucose, Bld: 158 mg/dL — ABNORMAL HIGH (ref 70–99)
Potassium: 4.1 mmol/L (ref 3.5–5.1)
Sodium: 138 mmol/L (ref 135–145)

## 2020-05-16 LAB — BLOOD GAS, ARTERIAL
Acid-base deficit: 3.1 mmol/L — ABNORMAL HIGH (ref 0.0–2.0)
Bicarbonate: 20.8 mmol/L (ref 20.0–28.0)
Drawn by: 252031
FIO2: 21
O2 Saturation: 98.8 %
Patient temperature: 37
pCO2 arterial: 33.2 mmHg (ref 32.0–48.0)
pH, Arterial: 7.413 (ref 7.350–7.450)
pO2, Arterial: 131 mmHg — ABNORMAL HIGH (ref 83.0–108.0)

## 2020-05-16 LAB — CBC
HCT: 33.6 % — ABNORMAL LOW (ref 36.0–46.0)
Hemoglobin: 11.1 g/dL — ABNORMAL LOW (ref 12.0–15.0)
MCH: 29.3 pg (ref 26.0–34.0)
MCHC: 33 g/dL (ref 30.0–36.0)
MCV: 88.7 fL (ref 80.0–100.0)
Platelets: 254 10*3/uL (ref 150–400)
RBC: 3.79 MIL/uL — ABNORMAL LOW (ref 3.87–5.11)
RDW: 13 % (ref 11.5–15.5)
WBC: 8.1 10*3/uL (ref 4.0–10.5)
nRBC: 0 % (ref 0.0–0.2)

## 2020-05-16 LAB — HEPARIN LEVEL (UNFRACTIONATED): Heparin Unfractionated: 0.35 IU/mL (ref 0.30–0.70)

## 2020-05-16 LAB — GLUCOSE, CAPILLARY
Glucose-Capillary: 171 mg/dL — ABNORMAL HIGH (ref 70–99)
Glucose-Capillary: 140 mg/dL — ABNORMAL HIGH (ref 70–99)
Glucose-Capillary: 233 mg/dL — ABNORMAL HIGH (ref 70–99)
Glucose-Capillary: 249 mg/dL — ABNORMAL HIGH (ref 70–99)

## 2020-05-16 MED ORDER — SODIUM CHLORIDE 0.9 % IV SOLN
1.5000 g | INTRAVENOUS | Status: DC
  Filled 2020-05-16: qty 1.5

## 2020-05-16 MED ORDER — SODIUM CHLORIDE 0.9 % IV SOLN
INTRAVENOUS | Status: DC
  Filled 2020-05-16 (×2): qty 30

## 2020-05-16 MED ORDER — INSULIN REGULAR(HUMAN) IN NACL 100-0.9 UT/100ML-% IV SOLN
INTRAVENOUS | Status: AC
  Administered 2020-05-17: 3 [IU]/h via INTRAVENOUS
  Filled 2020-05-16 (×2): qty 100

## 2020-05-16 MED ORDER — MAGNESIUM SULFATE 50 % IJ SOLN
40.0000 meq | INTRAMUSCULAR | Status: DC
  Filled 2020-05-16 (×3): qty 9.85

## 2020-05-16 MED ORDER — METOPROLOL TARTRATE 12.5 MG HALF TABLET
12.5000 mg | ORAL_TABLET | Freq: Once | ORAL | Status: AC
  Administered 2020-05-17: 12.5 mg via ORAL
  Filled 2020-05-16: qty 1

## 2020-05-16 MED ORDER — PLASMA-LYTE 148 IV SOLN
INTRAVENOUS | Status: DC
  Filled 2020-05-16 (×2): qty 2.5

## 2020-05-16 MED ORDER — MAGNESIUM SULFATE 50 % IJ SOLN
40.0000 meq | INTRAMUSCULAR | Status: DC
  Filled 2020-05-16: qty 9.85

## 2020-05-16 MED ORDER — SODIUM CHLORIDE 0.9 % IV SOLN
750.0000 mg | INTRAVENOUS | Status: DC
  Filled 2020-05-16: qty 750

## 2020-05-16 MED ORDER — NOREPINEPHRINE 4 MG/250ML-% IV SOLN
0.0000 ug/min | INTRAVENOUS | Status: DC
  Filled 2020-05-16 (×2): qty 250

## 2020-05-16 MED ORDER — DEXMEDETOMIDINE HCL IN NACL 400 MCG/100ML IV SOLN
0.1000 ug/kg/h | INTRAVENOUS | Status: DC
  Filled 2020-05-16: qty 100

## 2020-05-16 MED ORDER — PLASMA-LYTE 148 IV SOLN
INTRAVENOUS | Status: DC
  Filled 2020-05-16: qty 2.5

## 2020-05-16 MED ORDER — VANCOMYCIN HCL 1250 MG/250ML IV SOLN
1250.0000 mg | INTRAVENOUS | Status: AC
  Administered 2020-05-17: 1250 mg via INTRAVENOUS
  Filled 2020-05-16 (×2): qty 250

## 2020-05-16 MED ORDER — TRANEXAMIC ACID (OHS) BOLUS VIA INFUSION
15.0000 mg/kg | INTRAVENOUS | Status: AC
  Administered 2020-05-17: 1005 mg via INTRAVENOUS
  Filled 2020-05-16 (×2): qty 1005

## 2020-05-16 MED ORDER — SODIUM CHLORIDE 0.9 % IV SOLN
1.5000 g | INTRAVENOUS | Status: AC
  Administered 2020-05-17: 1.5 g via INTRAVENOUS
  Filled 2020-05-16 (×2): qty 1.5

## 2020-05-16 MED ORDER — PHENYLEPHRINE HCL-NACL 20-0.9 MG/250ML-% IV SOLN
30.0000 ug/min | INTRAVENOUS | Status: AC
  Administered 2020-05-17: 30 ug/min via INTRAVENOUS
  Filled 2020-05-16 (×2): qty 250

## 2020-05-16 MED ORDER — BISACODYL 5 MG PO TBEC
5.0000 mg | DELAYED_RELEASE_TABLET | Freq: Once | ORAL | Status: AC
  Administered 2020-05-16: 5 mg via ORAL
  Filled 2020-05-16: qty 1

## 2020-05-16 MED ORDER — INSULIN REGULAR(HUMAN) IN NACL 100-0.9 UT/100ML-% IV SOLN
INTRAVENOUS | Status: DC
  Filled 2020-05-16: qty 100

## 2020-05-16 MED ORDER — VANCOMYCIN HCL 1250 MG/250ML IV SOLN
1250.0000 mg | INTRAVENOUS | Status: DC
  Filled 2020-05-16: qty 250

## 2020-05-16 MED ORDER — DEXMEDETOMIDINE HCL IN NACL 400 MCG/100ML IV SOLN
0.1000 ug/kg/h | INTRAVENOUS | Status: AC
  Administered 2020-05-17: .7 ug/kg/h via INTRAVENOUS
  Filled 2020-05-16 (×2): qty 100

## 2020-05-16 MED ORDER — PHENYLEPHRINE HCL-NACL 20-0.9 MG/250ML-% IV SOLN
30.0000 ug/min | INTRAVENOUS | Status: DC
  Filled 2020-05-16: qty 250

## 2020-05-16 MED ORDER — EPINEPHRINE HCL 5 MG/250ML IV SOLN IN NS
0.0000 ug/min | INTRAVENOUS | Status: DC
  Filled 2020-05-16: qty 250

## 2020-05-16 MED ORDER — TRANEXAMIC ACID (OHS) PUMP PRIME SOLUTION
2.0000 mg/kg | INTRAVENOUS | Status: DC
  Filled 2020-05-16 (×2): qty 1.34

## 2020-05-16 MED ORDER — TRANEXAMIC ACID (OHS) PUMP PRIME SOLUTION
2.0000 mg/kg | INTRAVENOUS | Status: DC
  Filled 2020-05-16: qty 1.34

## 2020-05-16 MED ORDER — CHLORHEXIDINE GLUCONATE 0.12 % MT SOLN
15.0000 mL | Freq: Once | OROMUCOSAL | Status: AC
  Administered 2020-05-17: 15 mL via OROMUCOSAL
  Filled 2020-05-16: qty 15

## 2020-05-16 MED ORDER — TEMAZEPAM 15 MG PO CAPS: 15.0000 mg | ORAL_CAPSULE | Freq: Once | ORAL | Status: DC | PRN

## 2020-05-16 MED ORDER — TRANEXAMIC ACID (OHS) BOLUS VIA INFUSION
15.0000 mg/kg | INTRAVENOUS | Status: DC
  Filled 2020-05-16: qty 1005

## 2020-05-16 MED ORDER — TRANEXAMIC ACID 1000 MG/10ML IV SOLN
1.5000 mg/kg/h | INTRAVENOUS | Status: AC
  Administered 2020-05-17: 1.5 mg/kg/h via INTRAVENOUS
  Filled 2020-05-16 (×2): qty 25

## 2020-05-16 MED ORDER — EPINEPHRINE HCL 5 MG/250ML IV SOLN IN NS
0.0000 ug/min | INTRAVENOUS | Status: DC
Start: 2020-05-17 — End: 2020-05-17
  Filled 2020-05-16 (×2): qty 250

## 2020-05-16 MED ORDER — NOREPINEPHRINE 4 MG/250ML-% IV SOLN
0.0000 ug/min | INTRAVENOUS | Status: DC
Start: 2020-05-19 — End: 2020-05-16
  Filled 2020-05-16: qty 250

## 2020-05-16 MED ORDER — CHLORHEXIDINE GLUCONATE CLOTH 2 % EX PADS
6.0000 | MEDICATED_PAD | Freq: Once | CUTANEOUS | Status: AC
  Administered 2020-05-17: 6 via TOPICAL

## 2020-05-16 MED ORDER — NITROGLYCERIN IN D5W 200-5 MCG/ML-% IV SOLN
2.0000 ug/min | INTRAVENOUS | Status: DC
  Filled 2020-05-16: qty 250

## 2020-05-16 MED ORDER — MILRINONE LACTATE IN DEXTROSE 20-5 MG/100ML-% IV SOLN
0.3000 ug/kg/min | INTRAVENOUS | Status: DC
  Filled 2020-05-16: qty 100

## 2020-05-16 MED ORDER — SODIUM CHLORIDE 0.9 % IV SOLN
INTRAVENOUS | Status: DC
  Filled 2020-05-16: qty 30

## 2020-05-16 MED ORDER — MILRINONE LACTATE IN DEXTROSE 20-5 MG/100ML-% IV SOLN
0.3000 ug/kg/min | INTRAVENOUS | Status: DC
  Filled 2020-05-16 (×2): qty 100

## 2020-05-16 MED ORDER — POTASSIUM CHLORIDE 2 MEQ/ML IV SOLN
80.0000 meq | INTRAVENOUS | Status: DC
  Filled 2020-05-16: qty 40

## 2020-05-16 MED ORDER — NITROGLYCERIN IN D5W 200-5 MCG/ML-% IV SOLN
2.0000 ug/min | INTRAVENOUS | Status: AC
  Administered 2020-05-17: 40 ug/min via INTRAVENOUS
  Filled 2020-05-16 (×3): qty 250

## 2020-05-16 MED ORDER — CHLORHEXIDINE GLUCONATE CLOTH 2 % EX PADS
6.0000 | MEDICATED_PAD | Freq: Once | CUTANEOUS | Status: AC
  Administered 2020-05-16: 6 via TOPICAL

## 2020-05-16 MED ORDER — SODIUM CHLORIDE 0.9 % IV SOLN
750.0000 mg | INTRAVENOUS | Status: AC
  Administered 2020-05-17: 750 mg via INTRAVENOUS
  Filled 2020-05-16 (×2): qty 750

## 2020-05-16 MED ORDER — TRANEXAMIC ACID 1000 MG/10ML IV SOLN
1.5000 mg/kg/h | INTRAVENOUS | Status: DC
  Filled 2020-05-16: qty 25

## 2020-05-16 MED ORDER — POTASSIUM CHLORIDE 2 MEQ/ML IV SOLN
80.0000 meq | INTRAVENOUS | Status: DC
  Filled 2020-05-16 (×2): qty 40

## 2020-05-16 NOTE — Progress Notes (Addendum)
Progress Note  Patient Name: Tiffany White Date of Encounter: 05/16/2020  [facility] Cardiologist: Carlyle Dolly, MD   Subjective   No chest pain, but she admits to not ding much due to fear of pain. IV heparin and NTG continue.    Inpatient Medications    Scheduled Meds: . aspirin  81 mg Oral Daily  . cholecalciferol  1,000 Units Oral Daily  . epinephrine  0-10 mcg/min Intravenous To OR  . [START ON 05/19/2020] epinephrine  0-10 mcg/min Intravenous To OR  . ezetimibe  10 mg Oral Daily  . heparin-papaverine-plasmalyte irrigation   Irrigation To OR  . [START ON 05/19/2020] heparin-papaverine-plasmalyte irrigation   Irrigation To OR  . insulin aspart  0-5 Units Subcutaneous QHS  . insulin aspart  0-9 Units Subcutaneous TID WC  . insulin glargine  10 Units Subcutaneous QHS  . insulin   Intravenous To OR  . [START ON 05/19/2020] insulin   Intravenous To OR  . levothyroxine  75 mcg Oral Q0600  . losartan  12.5 mg Oral Daily  . magnesium sulfate  40 mEq Other To OR  . [START ON 05/19/2020] magnesium sulfate  40 mEq Other To OR  . metoprolol tartrate  12.5 mg Oral BID  . omega-3 acid ethyl esters  1 g Oral Daily  . phenylephrine  30-200 mcg/min Intravenous To OR  . [START ON 05/19/2020] phenylephrine  30-200 mcg/min Intravenous To OR  . potassium chloride  80 mEq Other To OR  . [START ON 05/19/2020] potassium chloride  80 mEq Other To OR  . rosuvastatin  10 mg Oral q1800  . sodium chloride flush  3 mL Intravenous Q12H  . [START ON 05/19/2020] tranexamic acid  15 mg/kg Intravenous To OR  . tranexamic acid  15 mg/kg Intravenous To OR  . [START ON 05/19/2020] tranexamic acid  2 mg/kg Intracatheter To OR  . tranexamic acid  2 mg/kg Intracatheter To OR  . cyanocobalamin  1,000 mcg Oral Daily   Continuous Infusions: . sodium chloride    . cefUROXime (ZINACEF)  IV    . [START ON 05/19/2020] cefUROXime (ZINACEF)  IV    . cefUROXime (ZINACEF)  IV    . [START ON 05/19/2020] cefUROXime  (ZINACEF)  IV    . dexmedetomidine    . [START ON 05/19/2020] dexmedetomidine    . heparin 30,000 units/NS 1000 mL solution for CELLSAVER    . [START ON 05/19/2020] heparin 30,000 units/NS 1000 mL solution for CELLSAVER    . heparin 1,100 Units/hr (05/16/20 0214)  . milrinone    . [START ON 05/19/2020] milrinone    . nitroGLYCERIN 40 mcg/min (05/16/20 0211)  . nitroGLYCERIN    . [START ON 05/19/2020] nitroGLYCERIN    . norepinephrine    . [START ON 05/19/2020] norepinephrine    . tranexamic acid (CYKLOKAPRON) infusion ([facility])    . [START ON 05/19/2020] tranexamic acid (CYKLOKAPRON) infusion ([facility])    . vancomycin    . [START ON 05/19/2020] vancomycin     PRN Meds: sodium chloride, acetaminophen, diazepam, ondansetron (ZOFRAN) IV, polyethylene glycol, sodium chloride flush   Vital Signs    Vitals:   05/16/20 0527 05/16/20 0646 05/16/20 0718 05/16/20 0724  BP: (!) 103/50 (!) 109/96  96/65  Pulse: 65 66  68  Resp: 15 18  18   Temp: 98.1 F (36.7 C)  98.1 F (36.7 C)   TempSrc: Oral  Oral   SpO2: 97% 98%  97%  Weight:  Height:        Intake/Output Summary (Last 24 hours) at 05/16/2020 0829 Last data filed at 05/16/2020 0313 Gross per 24 hour  Intake 1545.97 ml  Output 1275 ml  Net 270.97 ml   Last 3 Weights 05/16/2020 05/15/2020 05/14/2020  Weight (lbs) 147 lb 11.2 oz 148 lb 9.6 oz 146 lb 11.2 oz  Weight (kg) 66.996 kg 67.405 kg 66.543 kg      Telemetry    SR - Personally Reviewed  ECG    05/15/20 SR with non specific ST abnormalities.  - Personally Reviewed  Physical Exam   GEN: No acute distress.   Neck: No JVD Cardiac: RRR, no murmurs, rubs, or gallops.  Respiratory: Clear to auscultation bilaterally. GI: Soft, nontender, non-distended  MS: No edema; No deformity. Neuro:  Nonfocal  Psych: Normal affect   Labs    High Sensitivity Troponin:   Recent Labs  Lab 05/12/20 1959 05/12/20 2147 05/13/20 2043 05/13/20 2241  TROPONINIHS 41* 33* 10 16       Chemistry Recent Labs  Lab 05/13/20 0554 05/15/20 0350 05/16/20 0703  NA 140 138 138  K 4.1 3.5 4.1  CL 106 108 109  CO2 24 21* 19*  GLUCOSE 129* 159* 158*  BUN 21 16 16   CREATININE 1.08* 1.09* 1.02*  CALCIUM 9.5 8.8* 9.2  PROT 7.4  --   --   ALBUMIN 3.9  --   --   AST 16  --   --   ALT 16  --   --   ALKPHOS 68  --   --   BILITOT 0.6  --   --   GFRNONAA 54* 53* 57*  ANIONGAP 10 9 10      Hematology Recent Labs  Lab 05/14/20 0456 05/15/20 0350 05/16/20 0435  WBC 8.0 8.5 8.1  RBC 3.97 3.71* 3.79*  HGB 11.8* 11.0* 11.1*  HCT 36.1 33.5* 33.6*  MCV 90.9 90.3 88.7  MCH 29.7 29.6 29.3  MCHC 32.7 32.8 33.0  RDW 13.2 13.2 13.0  PLT 299 273 254    BNPNo results for input(s): BNP, PROBNP in the last 168 hours.   DDimer No results for input(s): DDIMER in the last 168 hours.   Radiology    CARDIAC CATHETERIZATION  Result Date: 05/14/2020  Ost LAD to Prox LAD lesion is 95% stenosed.  Ost Cx to Prox Cx lesion is 80% stenosed.  Ost RCA lesion is 70% stenosed.  Prox RCA lesion is 70% stenosed.  Prox RCA to Mid RCA lesion is 50% stenosed.  Mid RCA lesion is 75% stenosed.  Dist RCA lesion is 90% stenosed.  3rd RPL lesion is 95% stenosed.  Ost LM to Dist LM lesion is 10% stenosed.  There is significant coronary calcification with severe three-vessel coronary obstructive disease.  There is 95% ostial LAD stenosis, 70 to 80% ostial circumflex stenosis and diffuse stenoses in a calcified RCA with 70% ostial 70, 50 and 75% mid stenoses, distal 90% stenosis and 95% PLA stenosis. LVEDP 7 mmHg. RECOMMENDATION: Surgical consultation for CABG revascularization.  The patient was started on IV nitroglycerin during the procedure.  She will be started on heparin therapy 8 hours post TR band.  There is a reported statin allergy.  Zetia will be started; she may be a candidate for PCSK9 inhibition.   VAS US DOPPLER PRE CABG  Result Date: 05/15/2020 PREOPERATIVE VASCULAR EVALUATION   Indications:      Pre-CABG. Risk Factors:     Hyperlipidemia, Diabetes. Comparison  Study: no prior Performing Technologist: Abram Sander RVS  Examination Guidelines: A complete evaluation includes B-mode imaging, spectral Doppler, color Doppler, and power Doppler as needed of all accessible portions of each vessel. Bilateral testing is considered an integral part of a complete examination. Limited examinations for reoccurring indications may be performed as noted.  Right Carotid Findings: +----------+--------+--------+--------+------------+--------+           PSV cm/sEDV cm/sStenosisDescribe    Comments +----------+--------+--------+--------+------------+--------+ CCA Prox  69      12              heterogenous         +----------+--------+--------+--------+------------+--------+ CCA Distal55      12              heterogenous         +----------+--------+--------+--------+------------+--------+ ICA Prox  90      22      1-39%   heterogenous         +----------+--------+--------+--------+------------+--------+ ICA Distal112     30                                   +----------+--------+--------+--------+------------+--------+ ECA       188                                          +----------+--------+--------+--------+------------+--------+ Portions of this table do not appear on this page. +----------+--------+-------+--------+------------+           PSV cm/sEDV cmsDescribeArm Pressure +----------+--------+-------+--------+------------+ Subclavian193                                 +----------+--------+-------+--------+------------+ +---------+--------+--+--------+--+---------+ VertebralPSV cm/s68EDV cm/s19Antegrade +---------+--------+--+--------+--+---------+ Left Carotid Findings: +----------+--------+--------+--------+------------+--------+           PSV cm/sEDV cm/sStenosisDescribe    Comments  +----------+--------+--------+--------+------------+--------+ CCA Prox  85      10              heterogenous         +----------+--------+--------+--------+------------+--------+ CCA Distal90      11              heterogenous         +----------+--------+--------+--------+------------+--------+ ICA Prox  97      19      1-39%   heterogenous         +----------+--------+--------+--------+------------+--------+ ICA Distal82      17                                   +----------+--------+--------+--------+------------+--------+ ECA       178                                          +----------+--------+--------+--------+------------+--------+ +----------+--------+--------+--------+------------+ SubclavianPSV cm/sEDV cm/sDescribeArm Pressure +----------+--------+--------+--------+------------+           120                                  +----------+--------+--------+--------+------------+ +---------+--------+--+--------+--+---------+ VertebralPSV cm/s49EDV cm/s13Antegrade +---------+--------+--+--------+--+---------+  ABI Findings: +--------+------------------+-----+---------+--------+ Right   Rt Pressure (mmHg)IndexWaveform  Comment  +--------+------------------+-----+---------+--------+ [id number]                    triphasic         +--------+------------------+-----+---------+--------+ PTA     153               0.94 triphasic         +--------+------------------+-----+---------+--------+ DP      134               0.82 triphasic         +--------+------------------+-----+---------+--------+ +--------+------------------+-----+---------+-------+ Left    Lt Pressure (mmHg)IndexWaveform Comment +--------+------------------+-----+---------+-------+ [id number]                    triphasic        +--------+------------------+-----+---------+-------+ PTA     151               0.93 triphasic         +--------+------------------+-----+---------+-------+ DP      140               0.86 triphasic        +--------+------------------+-----+---------+-------+ +-------+---------------+----------------+ ABI/TBIToday's ABI/TBIPrevious ABI/TBI +-------+---------------+----------------+ Right  0.94                            +-------+---------------+----------------+ Left   0.93                            +-------+---------------+----------------+  Right Doppler Findings: +--------+--------+-----+---------+--------+ Site    PressureIndexDoppler  Comments +--------+--------+-----+---------+--------+ [id number]          triphasic         +--------+--------+-----+---------+--------+ Radial               triphasic         +--------+--------+-----+---------+--------+ Ulnar                triphasic         +--------+--------+-----+---------+--------+  Left Doppler Findings: +--------+--------+-----+---------+--------+ Site    PressureIndexDoppler  Comments +--------+--------+-----+---------+--------+ [id number]          triphasic         +--------+--------+-----+---------+--------+ Radial               triphasic         +--------+--------+-----+---------+--------+ Ulnar                triphasic         +--------+--------+-----+---------+--------+  Summary: Right Carotid: Velocities in the right ICA are consistent with a 1-39% stenosis. Left Carotid: Velocities in the left ICA are consistent with a 1-39% stenosis. Vertebrals: Bilateral vertebral arteries demonstrate antegrade flow. Right ABI: Resting right ankle-brachial index indicates mild right lower extremity arterial disease. Left ABI: Resting left ankle-brachial index indicates mild left lower extremity arterial disease. Right Upper Extremity: Doppler waveforms remain within normal limits with right radial compression. Doppler waveforms remain within normal limits with right ulnar compression. Left Upper  Extremity: Doppler waveforms remain within normal limits with left radial compression. Doppler waveforms decrease 50% with left ulnar compression.  Electronically signed by Jamelle Haring on 05/15/2020 at 5:15:39 PM.    Final     Cardiac Studies   Left Heart Cath and Coronary Angiography 05/14/2020: There is significant coronary calcification with severe three-vessel coronary obstructive disease. There is 95% ostial LAD stenosis, 70 to 80% ostial circumflex stenosis and diffuse stenoses in a calcified RCA with  70% ostial 70, 50 and 75% mid stenoses, distal 90% stenosis and 95% PLA stenosis.   Patient Profile     75 y.o. 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  Assessment & Plan    1. CAD, Severe three-vessel coronary obstructive disease Presented with 1 year history of worsening dyspnea on exertion has experienced chest pressure/burning with activity for the past 2 to 3 weeks. She is now experiencing chest pain with minimal exertion. Cardiac Risk factors of HLD, T2D, family history of CAD, and hx of tobacco use. Echo on 05/13/2020 EF 65-70%, with no regional wall motion abnormalities. LHC on 05/14/2020 showing severe three vessel coronary obstructive disease.    - Continue Heparin - Continue Nitroglycerin infusion  - Asprin 81 mg, Metoprolol 12.5 mg BID, Crestor 10 mg daily , Zetia 10 mg daily, Losartan 12.5 daily - Dr. Cyndia Bent has seen for CABG evaluation. May go today but schedule has been full, Dr. Orvan Seen to do procedure. Has been NPO  2. HLD  HDL 36, LDL 106. Patient had myalgias when taking Lipitor in the past.  - Continue Crestor and Zetia  3. Type 2 Diabetes Mellitus - On Lantus 10 units nightly and SSI-S, HS correction - Per primary  4.  HTN BP elevated last pm 168/56 and today 109/96      For questions or updates, please contact [facility] Please consult [email or web address] for contact info under    Signed, Cecilie Kicks, NP  05/16/2020, 8:29 AM     Patient seen and examined.  Agree with above documentation.  On exam, patient is alert and oriented, regular rate and rhythm, no murmurs, lungs CTAB, no LE edema or JVD.  Currently chest pain free on NTG gtt, would continue.  Continue IV heparin.  CABG planned for tomorrow  Donato Heinz, MD

## 2020-05-16 NOTE — Plan of Care (Signed)
  Problem: Nutrition: Goal: Adequate nutrition will be maintained Outcome: Completed/Met   Problem: Elimination: Goal: Will not experience complications related to urinary retention Outcome: Completed/Met   Problem: Safety: Goal: Ability to remain free from injury will improve Outcome: Completed/Met

## 2020-05-16 NOTE — Plan of Care (Signed)
  Problem: Safety: Goal: Ability to remain free from injury will improve Outcome: Completed/Met   Problem: Skin Integrity: Goal: Risk for impaired skin integrity will decrease Outcome: Completed/Met   Problem: Elimination: Goal: Will not experience complications related to urinary retention Outcome: Completed/Met   Problem: Nutrition: Goal: Adequate nutrition will be maintained Outcome: Completed/Met

## 2020-05-16 NOTE — Anesthesia Preprocedure Evaluation (Addendum)
Anesthesia Evaluation  Patient identified by MRN, date of birth, ID band Patient awake    Reviewed: Allergy & Precautions, H&P , NPO status , Patient's Chart, lab work & pertinent test results  Airway Mallampati: III  TM Distance: >3 FB Neck ROM: Full    Dental no notable dental hx. (+) Teeth Intact, Dental Advisory Given   Pulmonary neg pulmonary ROS, former smoker,    Pulmonary exam normal breath sounds clear to auscultation       Cardiovascular Exercise Tolerance: Good hypertension, + angina + CAD   Rhythm:Regular Rate:Normal     Neuro/Psych negative neurological ROS  negative psych ROS   GI/Hepatic negative GI ROS, Neg liver ROS,   Endo/Other  diabetes, Insulin Dependent, Oral Hypoglycemic AgentsHypothyroidism   Renal/GU negative Renal ROS  negative genitourinary   Musculoskeletal   Abdominal   Peds  Hematology negative hematology ROS (+)   Anesthesia Other Findings   Reproductive/Obstetrics negative OB ROS                            Anesthesia Physical Anesthesia Plan  ASA: IV  Anesthesia Plan: General   Post-op Pain Management:    Induction: Intravenous  PONV Risk Score and Plan: 3 and Midazolam and Treatment may vary due to age or medical condition  Airway Management Planned: Oral ETT  Additional Equipment: Arterial line, CVP, PA Cath, TEE and Ultrasound Guidance Line Placement  Intra-op Plan:   Post-operative Plan: Post-operative intubation/ventilation  Informed Consent: I have reviewed the patients History and Physical, chart, labs and discussed the procedure including the risks, benefits and alternatives for the proposed anesthesia with the patient or authorized representative who has indicated his/her understanding and acceptance.     Dental advisory given  Plan Discussed with: CRNA  Anesthesia Plan Comments:         Anesthesia Quick Evaluation

## 2020-05-16 NOTE — Progress Notes (Signed)
PROGRESS NOTE    Tiffany White   ALP:[id number]  DOB: 11-09-1945  DOA: 05/12/2020 PCP: Almira Bar, MD   Brief Narrative:  Tiffany White 75-year-old female with hypertension, diabetes mellitus type 2, obesity, hypothyroidism who presented to the hospital for left-sided chest pain that felt like pressure or burning associated with shortness of breath.  Card in the hospital when she was ambulating to the bathroom and she was given nitroglycerin for it.   Subjective: Still having twinges of chest pain when using the bedside commode. Still constipated.    Assessment & Plan:   Principal Problem: Angina -Placed on heparin -Cardiac cath today reveals severe coronary artery disease-CT surgery consult for CABG  -The echo reveals grade 1 diastolic heart failure - Aspirin, metoprolol, Zetia, Crestor, heparin -Nitroglycerin infusion started 4/13- we are having to limit her ambulation due to severe angina with mild ambulation - CABG planned for today- pushed to tomorrow  Active Problems: Dyslipidemia -LDL 36, LDL 106, triglycerides 228 -Continue Crestor and Zetia    Essential hypertension -Has been started on losartan, metoprolol and nitroglycerin infusion for above -BP stable    Hypothyroidism -Continue Synthroid    Hyperglycemia due to diabetes mellitus 2 - A1c on 05/13/20 7.8 -Continue sliding scale insulin-Metformin and NovoLog 70/30 on hold -Started Lantus 10 units nightly   Vitamin D and vitamin B12 deficiency -Continue replacement  Hypokalemia - have ordered replacement- resolved  Constipation - Follow- she will use Miralax today   Time spent in minutes: 35 DVT prophylaxis: Heparin infusion Code Status: Full code Family Communication:  Level of Care: Level of care: Telemetry Cardiac Disposition Plan:  Status is: Inpatient   Dispo: The patient is from: Home              Anticipated d/c is to: Home              Patient currently is not medically stable to  d/c.   Difficult to place patient No   Consultants:   Cardiology  CT surgery Procedures:   Cath Antimicrobials:  Anti-infectives (From admission, onward)   Start     Dose/Rate Route Frequency Ordered Stop   05/19/20 0400  vancomycin (VANCOREADY) IVPB 1250 mg/250 mL  Status:  Discontinued        1,250 mg 166.7 mL/hr over 90 Minutes Intravenous To Surgery 05/16/20 0726 05/16/20 1055   05/19/20 0400  cefUROXime (ZINACEF) 1.5 g in sodium chloride 0.9 % 100 mL IVPB  Status:  Discontinued        1.5 g 200 mL/hr over 30 Minutes Intravenous To Surgery 05/16/20 0726 05/16/20 1055   05/19/20 0400  cefUROXime (ZINACEF) 750 mg in sodium chloride 0.9 % 100 mL IVPB  Status:  Discontinued        750 mg 200 mL/hr over 30 Minutes Intravenous To Surgery 05/16/20 0726 05/16/20 1055   05/17/20 0400  vancomycin (VANCOREADY) IVPB 1250 mg/250 mL        1,250 mg 166.7 mL/hr over 90 Minutes Intravenous To Surgery 05/16/20 1442 05/18/20 0400   05/17/20 0400  cefUROXime (ZINACEF) 1.5 g in sodium chloride 0.9 % 100 mL IVPB        1.5 g 200 mL/hr over 30 Minutes Intravenous To Surgery 05/16/20 1442 05/18/20 0400   05/17/20 0400  cefUROXime (ZINACEF) 750 mg in sodium chloride 0.9 % 100 mL IVPB        750 mg 200 mL/hr over 30 Minutes Intravenous To Surgery 05/16/20 1442 05/18/20 0400  05/16/20 0400  vancomycin (VANCOREADY) IVPB 1250 mg/250 mL  Status:  Discontinued        1,250 mg 166.7 mL/hr over 90 Minutes Intravenous To Surgery 05/15/20 1324 05/16/20 1439   05/16/20 0400  cefUROXime (ZINACEF) 1.5 g in sodium chloride 0.9 % 100 mL IVPB  Status:  Discontinued        1.5 g 200 mL/hr over 30 Minutes Intravenous To Surgery 05/15/20 1324 05/16/20 1439   05/16/20 0400  cefUROXime (ZINACEF) 750 mg in sodium chloride 0.9 % 100 mL IVPB  Status:  Discontinued        750 mg 200 mL/hr over 30 Minutes Intravenous To Surgery 05/15/20 1324 05/16/20 1439       Objective: Vitals:   05/16/20 0718 05/16/20 0724  05/16/20 1038 05/16/20 1149  BP:  96/65 104/73   Pulse:  68 68   Resp:  18 19   Temp: 98.1 F (36.7 C)  97.7 F (36.5 C) (!) 97.5 F (36.4 C)  TempSrc: Oral  Oral Oral  SpO2:  97% 100%   Weight:      Height:        Intake/Output Summary (Last 24 hours) at 05/16/2020 1501 Last data filed at 05/16/2020 1237 Gross per 24 hour  Intake 602.28 ml  Output 675 ml  Net -72.72 ml   Filed Weights   05/14/20 0236 05/15/20 0658 05/16/20 0256  Weight: 66.5 kg 67.4 kg 67 kg    Examination: General exam: Appears comfortable  HEENT: PERRLA, oral mucosa moist, no sclera icterus or thrush Respiratory system: Clear to auscultation. Respiratory effort normal. Cardiovascular system: S1 & S2 heard, regular rate and rhythm Gastrointestinal system: Abdomen soft, non-tender, nondistended. Normal bowel sounds   Central nervous system: Alert and oriented. No focal neurological deficits. Extremities: No cyanosis, clubbing or edema Skin: No rashes or ulcers Psychiatry:  Mood & affect appropriate.     Data Reviewed: I have personally reviewed following labs and imaging studies  CBC: Recent Labs  Lab 05/12/20 1959 05/13/20 0554 05/14/20 0456 05/15/20 0350 05/16/20 0435  WBC 9.3 10.5 8.0 8.5 8.1  HGB 12.4 11.6* 11.8* 11.0* 11.1*  HCT 38.5 36.5 36.1 33.5* 33.6*  MCV 92.3 91.9 90.9 90.3 88.7  PLT 338 315 299 273 267   Basic Metabolic Panel: Recent Labs  Lab 05/12/20 1959 05/13/20 0554 05/15/20 0350 05/16/20 0703  NA 139 140 138 138  K 4.1 4.1 3.5 4.1  CL 105 106 108 109  CO2 23 24 21* 19*  GLUCOSE 150* 129* 159* 158*  BUN 24* 21 16 16   CREATININE 1.13* 1.08* 1.09* 1.02*  CALCIUM 9.8 9.5 8.8* 9.2  MG  --  2.0  --   --   PHOS  --  3.7  --   --    GFR: Estimated Creatinine Clearance: 43.3 mL/min (A) (by C-G formula based on SCr of 1.02 mg/dL (H)). Liver Function Tests: Recent Labs  Lab 05/13/20 0554  AST 16  ALT 16  ALKPHOS 68  BILITOT 0.6  PROT 7.4  ALBUMIN 3.9   No  results for input(s): LIPASE, AMYLASE in the last 168 hours. No results for input(s): AMMONIA in the last 168 hours. Coagulation Profile: Recent Labs  Lab 05/13/20 0554  INR 1.0   Cardiac Enzymes: No results for input(s): CKTOTAL, CKMB, CKMBINDEX, TROPONINI in the last 168 hours. BNP (last 3 results) No results for input(s): PROBNP in the last 8760 hours. HbA1C: No results for input(s): HGBA1C in the last 72  hours. CBG: Recent Labs  Lab 05/15/20 1211 05/15/20 1632 05/15/20 2310 05/16/20 0535 05/16/20 1152  GLUCAP 189* 163* 227* 171* 140*   Lipid Profile: Recent Labs    05/14/20 0456  CHOL 188  HDL 36*  LDLCALC 106*  TRIG 228*  CHOLHDL 5.2   Thyroid Function Tests: No results for input(s): TSH, T4TOTAL, FREET4, T3FREE, THYROIDAB in the last 72 hours. Anemia Panel: No results for input(s): VITAMINB12, FOLATE, FERRITIN, TIBC, IRON, RETICCTPCT in the last 72 hours. Urine analysis:    Component Value Date/Time   COLORURINE STRAW (A) 05/15/2020 2347   APPEARANCEUR CLEAR 05/15/2020 2347   LABSPEC 1.006 05/15/2020 2347   PHURINE 6.0 05/15/2020 2347   GLUCOSEU >=500 (A) 05/15/2020 2347   HGBUR NEGATIVE 05/15/2020 [place] NEGATIVE 05/15/2020 [place] 05/15/2020 2347   PROTEINUR 30 (A) 05/15/2020 2347   NITRITE NEGATIVE 05/15/2020 2347   LEUKOCYTESUR TRACE (A) 05/15/2020 2347   Sepsis Labs: @LABRCNTIP (procalcitonin:4,lacticidven:4) ) Recent Results (from the past 240 hour(s))  Resp Panel by RT-PCR (Flu A&B, Covid) Nasopharyngeal Swab     Status: None   Collection Time: 05/13/20  1:46 AM   Specimen: Nasopharyngeal Swab; Nasopharyngeal(NP) swabs in vial transport medium  Result Value Ref Range Status   SARS Coronavirus 2 by RT PCR NEGATIVE NEGATIVE Final    Comment: (NOTE) SARS-CoV-2 target nucleic acids are NOT DETECTED.  The SARS-CoV-2 RNA is generally detectable in upper respiratory specimens during the acute phase of infection. The  lowest concentration of SARS-CoV-2 viral copies this assay can detect is 138 copies/mL. A negative result does not preclude SARS-Cov-2 infection and should not be used as the sole basis for treatment or other patient management decisions. A negative result may occur with  improper specimen collection/handling, submission of specimen other than nasopharyngeal swab, presence of viral mutation(s) within the areas targeted by this assay, and inadequate number of viral copies(<138 copies/mL). A negative result must be combined with clinical observations, patient history, and epidemiological information. The expected result is Negative.  Fact Sheet for Patients:  EntrepreneurPulse.com.au  Fact Sheet for Healthcare Providers:  IncredibleEmployment.be  This test is no t yet approved or cleared by the [place] FDA and  has been authorized for detection and/or diagnosis of SARS-CoV-2 by FDA under an Emergency Use Authorization (EUA). This EUA will remain  in effect (meaning this test can be used) for the duration of the COVID-19 declaration under Section 564(b)(1) of the Act, 21 U.S.C.section 360bbb-3(b)(1), unless the authorization is terminated  or revoked sooner.       Influenza A by PCR NEGATIVE NEGATIVE Final   Influenza B by PCR NEGATIVE NEGATIVE Final    Comment: (NOTE) The Xpert Xpress SARS-CoV-2/FLU/RSV plus assay is intended as an aid in the diagnosis of influenza from Nasopharyngeal swab specimens and should not be used as a sole basis for treatment. Nasal washings and aspirates are unacceptable for Xpert Xpress SARS-CoV-2/FLU/RSV testing.  Fact Sheet for Patients: EntrepreneurPulse.com.au  Fact Sheet for Healthcare Providers: IncredibleEmployment.be  This test is not yet approved or cleared by the [place] FDA and has been authorized for detection and/or diagnosis of SARS-CoV-2 by FDA under  an Emergency Use Authorization (EUA). This EUA will remain in effect (meaning this test can be used) for the duration of the COVID-19 declaration under Section 564(b)(1) of the Act, 21 U.S.C. section 360bbb-3(b)(1), unless the authorization is terminated or revoked.  Performed at [facility], [address] [place], [place] [address]  MRSA PCR Screening     Status: None   Collection Time: 05/15/20  8:40 PM   Specimen: Nasal Mucosa; Nasopharyngeal  Result Value Ref Range Status   MRSA by PCR NEGATIVE NEGATIVE Final    Comment:        The GeneXpert MRSA Assay (FDA approved for NASAL specimens only), is one component of a comprehensive MRSA colonization surveillance program. It is not intended to diagnose MRSA infection nor to guide or monitor treatment for MRSA infections. Performed at [facility], [place] [address], [place], [place] [address]          Radiology Studies: CT CHEST WO CONTRAST  Result Date: 05/16/2020 CLINICAL DATA:  Thoracic aortic disease. Preop for coronary artery bypass graft. EXAM: CT CHEST WITHOUT CONTRAST TECHNIQUE: Multidetector CT imaging of the chest was performed following the standard protocol without IV contrast. COMPARISON:  None. FINDINGS: Cardiovascular: Atherosclerosis of thoracic aorta is noted without aneurysm formation. Coronary artery calcifications are noted. Normal cardiac size. No pericardial effusion. Mediastinum/Nodes: No enlarged mediastinal or axillary lymph nodes. Thyroid gland, trachea, and esophagus demonstrate no significant findings. Lungs/Pleura: No pneumothorax or pleural effusion is noted. Mild bronchiectasis is noted in both lower lobes. Peripheral reticular densities are noted throughout both lungs most consistent with scarring or other chronic lung disease. No definite acute abnormality is noted. Upper Abdomen: No acute abnormality. Musculoskeletal: No chest wall mass or suspicious bone lesions identified. IMPRESSION:  Coronary artery calcifications are noted suggesting coronary artery disease. Peripheral reticular densities are noted throughout both lungs most consistent with scarring or other chronic lung disease. Aortic Atherosclerosis (ICD10-I70.0). Electronically Signed   By: Marijo Conception M.D.   On: 05/16/2020 12:48   CARDIAC CATHETERIZATION  Result Date: 05/14/2020  Ost LAD to Prox LAD lesion is 95% stenosed.  Ost Cx to Prox Cx lesion is 80% stenosed.  Ost RCA lesion is 70% stenosed.  Prox RCA lesion is 70% stenosed.  Prox RCA to Mid RCA lesion is 50% stenosed.  Mid RCA lesion is 75% stenosed.  Dist RCA lesion is 90% stenosed.  3rd RPL lesion is 95% stenosed.  Ost LM to Dist LM lesion is 10% stenosed.  There is significant coronary calcification with severe three-vessel coronary obstructive disease.  There is 95% ostial LAD stenosis, 70 to 80% ostial circumflex stenosis and diffuse stenoses in a calcified RCA with 70% ostial 70, 50 and 75% mid stenoses, distal 90% stenosis and 95% PLA stenosis. LVEDP 7 mmHg. RECOMMENDATION: Surgical consultation for CABG revascularization.  The patient was started on IV nitroglycerin during the procedure.  She will be started on heparin therapy 8 hours post TR band.  There is a reported statin allergy.  Zetia will be started; she may be a candidate for PCSK9 inhibition.   VAS US DOPPLER PRE CABG  Result Date: 05/15/2020 PREOPERATIVE VASCULAR EVALUATION  Indications:      Pre-CABG. Risk Factors:     Hyperlipidemia, Diabetes. Comparison Study: no prior Performing Technologist: Abram Sander RVS  Examination Guidelines: A complete evaluation includes B-mode imaging, spectral Doppler, color Doppler, and power Doppler as needed of all accessible portions of each vessel. Bilateral testing is considered an integral part of a complete examination. Limited examinations for reoccurring indications may be performed as noted.  Right Carotid Findings:  +----------+--------+--------+--------+------------+--------+           PSV cm/sEDV cm/sStenosisDescribe    Comments +----------+--------+--------+--------+------------+--------+ CCA Prox  69      12  heterogenous         +----------+--------+--------+--------+------------+--------+ CCA Distal55      12              heterogenous         +----------+--------+--------+--------+------------+--------+ ICA Prox  90      22      1-39%   heterogenous         +----------+--------+--------+--------+------------+--------+ ICA Distal112     30                                   +----------+--------+--------+--------+------------+--------+ ECA       188                                          +----------+--------+--------+--------+------------+--------+ Portions of this table do not appear on this page. +----------+--------+-------+--------+------------+           PSV cm/sEDV cmsDescribeArm Pressure +----------+--------+-------+--------+------------+ Subclavian193                                 +----------+--------+-------+--------+------------+ +---------+--------+--+--------+--+---------+ VertebralPSV cm/s68EDV cm/s19Antegrade +---------+--------+--+--------+--+---------+ Left Carotid Findings: +----------+--------+--------+--------+------------+--------+           PSV cm/sEDV cm/sStenosisDescribe    Comments +----------+--------+--------+--------+------------+--------+ CCA Prox  85      10              heterogenous         +----------+--------+--------+--------+------------+--------+ CCA Distal90      11              heterogenous         +----------+--------+--------+--------+------------+--------+ ICA Prox  97      19      1-39%   heterogenous         +----------+--------+--------+--------+------------+--------+ ICA Distal82      17                                    +----------+--------+--------+--------+------------+--------+ ECA       178                                          +----------+--------+--------+--------+------------+--------+ +----------+--------+--------+--------+------------+ SubclavianPSV cm/sEDV cm/sDescribeArm Pressure +----------+--------+--------+--------+------------+           120                                  +----------+--------+--------+--------+------------+ +---------+--------+--+--------+--+---------+ VertebralPSV cm/s49EDV cm/s13Antegrade +---------+--------+--+--------+--+---------+  ABI Findings: +--------+------------------+-----+---------+--------+ Right   Rt Pressure (mmHg)IndexWaveform Comment  +--------+------------------+-----+---------+--------+ [id number]                    triphasic         +--------+------------------+-----+---------+--------+ PTA     153               0.94 triphasic         +--------+------------------+-----+---------+--------+ DP      134               0.82 triphasic         +--------+------------------+-----+---------+--------+ +--------+------------------+-----+---------+-------+ Left  Lt Pressure (mmHg)IndexWaveform Comment +--------+------------------+-----+---------+-------+ [id number]                    triphasic        +--------+------------------+-----+---------+-------+ PTA     151               0.93 triphasic        +--------+------------------+-----+---------+-------+ DP      140               0.86 triphasic        +--------+------------------+-----+---------+-------+ +-------+---------------+----------------+ ABI/TBIToday's ABI/TBIPrevious ABI/TBI +-------+---------------+----------------+ Right  0.94                            +-------+---------------+----------------+ Left   0.93                            +-------+---------------+----------------+  Right Doppler Findings:  +--------+--------+-----+---------+--------+ Site    PressureIndexDoppler  Comments +--------+--------+-----+---------+--------+ [id number]          triphasic         +--------+--------+-----+---------+--------+ Radial               triphasic         +--------+--------+-----+---------+--------+ Ulnar                triphasic         +--------+--------+-----+---------+--------+  Left Doppler Findings: +--------+--------+-----+---------+--------+ Site    PressureIndexDoppler  Comments +--------+--------+-----+---------+--------+ [id number]          triphasic         +--------+--------+-----+---------+--------+ Radial               triphasic         +--------+--------+-----+---------+--------+ Ulnar                triphasic         +--------+--------+-----+---------+--------+  Summary: Right Carotid: Velocities in the right ICA are consistent with a 1-39% stenosis. Left Carotid: Velocities in the left ICA are consistent with a 1-39% stenosis. Vertebrals: Bilateral vertebral arteries demonstrate antegrade flow. Right ABI: Resting right ankle-brachial index indicates mild right lower extremity arterial disease. Left ABI: Resting left ankle-brachial index indicates mild left lower extremity arterial disease. Right Upper Extremity: Doppler waveforms remain within normal limits with right radial compression. Doppler waveforms remain within normal limits with right ulnar compression. Left Upper Extremity: Doppler waveforms remain within normal limits with left radial compression. Doppler waveforms decrease 50% with left ulnar compression.  Electronically signed by Jamelle Haring on 05/15/2020 at 5:15:39 PM.    Final       Scheduled Meds: . aspirin  81 mg Oral Daily  . [START ON 05/17/2020] chlorhexidine  15 mL Mouth/Throat Once  . Chlorhexidine Gluconate Cloth  6 each Topical Once   And  . [START ON 05/17/2020] Chlorhexidine Gluconate Cloth  6 each Topical Once  .  cholecalciferol  1,000 Units Oral Daily  . [START ON 05/17/2020] epinephrine  0-10 mcg/min Intravenous To OR  . ezetimibe  10 mg Oral Daily  . [START ON 05/17/2020] heparin-papaverine-plasmalyte irrigation   Irrigation To OR  . insulin aspart  0-5 Units Subcutaneous QHS  . insulin aspart  0-9 Units Subcutaneous TID WC  . insulin glargine  10 Units Subcutaneous QHS  . [START ON 05/17/2020] insulin   Intravenous To OR  . levothyroxine  75 mcg Oral Q0600  . losartan  12.5 mg Oral Daily  . [  START ON 05/17/2020] magnesium sulfate  40 mEq Other To OR  . metoprolol tartrate  12.5 mg Oral BID  . [START ON 05/17/2020] metoprolol tartrate  12.5 mg Oral Once  . omega-3 acid ethyl esters  1 g Oral Daily  . [START ON 05/17/2020] phenylephrine  30-200 mcg/min Intravenous To OR  . [START ON 05/17/2020] potassium chloride  80 mEq Other To OR  . rosuvastatin  10 mg Oral q1800  . sodium chloride flush  3 mL Intravenous Q12H  . [START ON 05/17/2020] tranexamic acid  15 mg/kg Intravenous To OR  . [START ON 05/17/2020] tranexamic acid  2 mg/kg Intracatheter To OR  . cyanocobalamin  1,000 mcg Oral Daily   Continuous Infusions: . sodium chloride    . [START ON 05/17/2020] cefUROXime (ZINACEF)  IV    . [START ON 05/17/2020] cefUROXime (ZINACEF)  IV    . [START ON 05/17/2020] dexmedetomidine    . [START ON 05/17/2020] heparin 30,000 units/NS 1000 mL solution for CELLSAVER    . heparin 1,100 Units/hr (05/16/20 0214)  . [START ON 05/17/2020] milrinone    . nitroGLYCERIN 40 mcg/min (05/16/20 0211)  . [START ON 05/17/2020] nitroGLYCERIN    . [START ON 05/17/2020] norepinephrine    . [START ON 05/17/2020] tranexamic acid (CYKLOKAPRON) infusion ([facility])    . [START ON 05/17/2020] vancomycin       LOS: 2 days      Debbe Odea, MD [facility] Pager: [email or web address] 05/16/2020, 3:01 PM

## 2020-05-16 NOTE — Progress Notes (Signed)
ANTICOAGULATION CONSULT NOTE - Follow Up Consult  Pharmacy Consult for IV Heparin Indication: chest pain/ACS  Allergies  Allergen Reactions  . Statins Other (See Comments)    Severe Body aches     Patient Measurements: Height: 5' 2.5" (158.8 cm) Weight: 67 kg (147 lb 11.2 oz) IBW/kg (Calculated) : 51.25 Heparin Dosing Weight: 65 kg  Vital Signs: Temp: 97.9 F (36.6 C) (04/15 0042) Temp Source: Oral (04/15 0042) BP: 168/56 (04/15 0042) Pulse Rate: 63 (04/15 0042)  Labs: Recent Labs    05/13/20 0554 05/13/20 0554 05/13/20 2043 05/13/20 2241 05/14/20 0456 05/15/20 0350 05/15/20 0809 05/15/20 1930 05/16/20 0435  HGB 11.6*  --   --   --  11.8* 11.0*  --   --  11.1*  HCT 36.5  --   --   --  36.1 33.5*  --   --  33.6*  PLT 315  --   --   --  299 273  --   --  254  APTT 28  --   --   --   --   --   --   --   --   LABPROT 13.2  --   --   --   --   --   --   --   --   INR 1.0  --   --   --   --   --   --   --   --   HEPARINUNFRC  --    < > 0.36  --  0.19*  --  0.23* 0.34 0.35  CREATININE 1.08*  --   --   --   --  1.09*  --   --   --   TROPONINIHS  --   --  10 16  --   --   --   --   --    < > = values in this interval not displayed.    Estimated Creatinine Clearance: 40.6 mL/min (A) (by C-G formula based on SCr of 1.09 mg/dL (H)).  Assessment: Pharmacy was consulted to dose heparin in this 75 yr old woman with chest pain/ACS.  4/13: cath showed multivessel CAD; pt to have CABG on 4/15.  Heparin level therapeutic (0.35) on gtt at 1100 units/hr. No bleeding noted.  Goal of Therapy:  Heparin level 0.3-0.7 units/ml Monitor platelets by anticoagulation protocol: Yes   Plan:  Continue heparin infusion at 1100 units/hr  Plan for CABG today  Sherlon Handing, PharmD, BCPS Please see amion for complete clinical pharmacist phone list 05/16/2020 5:26 AM

## 2020-05-17 ENCOUNTER — Inpatient Hospital Stay (HOSPITAL_COMMUNITY): Payer: Medicare HMO | Admitting: Certified Registered Nurse Anesthetist

## 2020-05-17 ENCOUNTER — Inpatient Hospital Stay (HOSPITAL_COMMUNITY): Payer: Medicare HMO

## 2020-05-17 ENCOUNTER — Encounter (HOSPITAL_COMMUNITY): Payer: Self-pay | Admitting: Internal Medicine

## 2020-05-17 ENCOUNTER — Inpatient Hospital Stay (HOSPITAL_COMMUNITY)
Admission: EM | Disposition: A | Discharge status: Skilled Nursing Facility | Payer: Self-pay | Source: Home / Self Care | Attending: Cardiothoracic Surgery

## 2020-05-17 DIAGNOSIS — Z951 Presence of aortocoronary bypass graft: Secondary | ICD-10-CM

## 2020-05-17 HISTORY — PX: CORONARY ARTERY BYPASS GRAFT: SHX141

## 2020-05-17 HISTORY — PX: TEE WITHOUT CARDIOVERSION: SHX5443

## 2020-05-17 HISTORY — PX: ENDOVEIN HARVEST OF GREATER SAPHENOUS VEIN: SHX5059

## 2020-05-17 LAB — POCT I-STAT 7, (LYTES, BLD GAS, ICA,H+H)
Acid-Base Excess: 0 mmol/L (ref 0.0–2.0)
Acid-base deficit: 3 mmol/L — ABNORMAL HIGH (ref 0.0–2.0)
Acid-base deficit: 2 mmol/L (ref 0.0–2.0)
Acid-base deficit: 2 mmol/L (ref 0.0–2.0)
Acid-base deficit: 2 mmol/L (ref 0.0–2.0)
Acid-base deficit: 3 mmol/L — ABNORMAL HIGH (ref 0.0–2.0)
Bicarbonate: 22.4 mmol/L (ref 20.0–28.0)
Bicarbonate: 23 mmol/L (ref 20.0–28.0)
Bicarbonate: 24.1 mmol/L (ref 20.0–28.0)
Bicarbonate: 22.8 mmol/L (ref 20.0–28.0)
Bicarbonate: 22.6 mmol/L (ref 20.0–28.0)
Bicarbonate: 21.8 mmol/L (ref 20.0–28.0)
Calcium, Ion: 1.3 mmol/L (ref 1.15–1.40)
Calcium, Ion: 0.93 mmol/L — ABNORMAL LOW (ref 1.15–1.40)
Calcium, Ion: 0.99 mmol/L — ABNORMAL LOW (ref 1.15–1.40)
Calcium, Ion: 0.94 mmol/L — ABNORMAL LOW (ref 1.15–1.40)
Calcium, Ion: 1.11 mmol/L — ABNORMAL LOW (ref 1.15–1.40)
Calcium, Ion: 1.08 mmol/L — ABNORMAL LOW (ref 1.15–1.40)
HCT: 30 % — ABNORMAL LOW (ref 36.0–46.0)
HCT: 30 % — ABNORMAL LOW (ref 36.0–46.0)
HCT: 26 % — ABNORMAL LOW (ref 36.0–46.0)
HCT: 26 % — ABNORMAL LOW (ref 36.0–46.0)
HCT: 31 % — ABNORMAL LOW (ref 36.0–46.0)
HCT: 33 % — ABNORMAL LOW (ref 36.0–46.0)
Hemoglobin: 10.2 g/dL — ABNORMAL LOW (ref 12.0–15.0)
Hemoglobin: 10.2 g/dL — ABNORMAL LOW (ref 12.0–15.0)
Hemoglobin: 8.8 g/dL — ABNORMAL LOW (ref 12.0–15.0)
Hemoglobin: 8.8 g/dL — ABNORMAL LOW (ref 12.0–15.0)
Hemoglobin: 10.5 g/dL — ABNORMAL LOW (ref 12.0–15.0)
Hemoglobin: 11.2 g/dL — ABNORMAL LOW (ref 12.0–15.0)
O2 Saturation: 100 %
O2 Saturation: 100 %
O2 Saturation: 100 %
O2 Saturation: 100 %
O2 Saturation: 95 %
O2 Saturation: 97 %
Patient temperature: 36.9
Patient temperature: 37.3
Potassium: 4 mmol/L (ref 3.5–5.1)
Potassium: 4.2 mmol/L (ref 3.5–5.1)
Potassium: 5.8 mmol/L — ABNORMAL HIGH (ref 3.5–5.1)
Potassium: 5.4 mmol/L — ABNORMAL HIGH (ref 3.5–5.1)
Potassium: 4.1 mmol/L (ref 3.5–5.1)
Potassium: 4.3 mmol/L (ref 3.5–5.1)
Sodium: 141 mmol/L (ref 135–145)
Sodium: 143 mmol/L (ref 135–145)
Sodium: 141 mmol/L (ref 135–145)
Sodium: 143 mmol/L (ref 135–145)
Sodium: 141 mmol/L (ref 135–145)
Sodium: 141 mmol/L (ref 135–145)
TCO2: 24 mmol/L (ref 22–32)
TCO2: 24 mmol/L (ref 22–32)
TCO2: 25 mmol/L (ref 22–32)
TCO2: 24 mmol/L (ref 22–32)
TCO2: 24 mmol/L (ref 22–32)
TCO2: 23 mmol/L (ref 22–32)
pCO2 arterial: 42 mmHg (ref 32.0–48.0)
pCO2 arterial: 41 mmHg (ref 32.0–48.0)
pCO2 arterial: 34.2 mmHg (ref 32.0–48.0)
pCO2 arterial: 37.2 mmHg (ref 32.0–48.0)
pCO2 arterial: 38.3 mmHg (ref 32.0–48.0)
pCO2 arterial: 37.2 mmHg (ref 32.0–48.0)
pH, Arterial: 7.335 — ABNORMAL LOW (ref 7.350–7.450)
pH, Arterial: 7.356 (ref 7.350–7.450)
pH, Arterial: 7.457 — ABNORMAL HIGH (ref 7.350–7.450)
pH, Arterial: 7.397 (ref 7.350–7.450)
pH, Arterial: 7.379 (ref 7.350–7.450)
pH, Arterial: 7.377 (ref 7.350–7.450)
pO2, Arterial: 511 mmHg — ABNORMAL HIGH (ref 83.0–108.0)
pO2, Arterial: 359 mmHg — ABNORMAL HIGH (ref 83.0–108.0)
pO2, Arterial: 374 mmHg — ABNORMAL HIGH (ref 83.0–108.0)
pO2, Arterial: 272 mmHg — ABNORMAL HIGH (ref 83.0–108.0)
pO2, Arterial: 76 mmHg — ABNORMAL LOW (ref 83.0–108.0)
pO2, Arterial: 92 mmHg (ref 83.0–108.0)

## 2020-05-17 LAB — CBC
HCT: 30.7 % — ABNORMAL LOW (ref 36.0–46.0)
HCT: 29.5 % — ABNORMAL LOW (ref 36.0–46.0)
HCT: 34.7 % — ABNORMAL LOW (ref 36.0–46.0)
Hemoglobin: 10.3 g/dL — ABNORMAL LOW (ref 12.0–15.0)
Hemoglobin: 9.8 g/dL — ABNORMAL LOW (ref 12.0–15.0)
Hemoglobin: 11.6 g/dL — ABNORMAL LOW (ref 12.0–15.0)
MCH: 30.3 pg (ref 26.0–34.0)
MCH: 30.1 pg (ref 26.0–34.0)
MCH: 29.7 pg (ref 26.0–34.0)
MCHC: 33.6 g/dL (ref 30.0–36.0)
MCHC: 33.2 g/dL (ref 30.0–36.0)
MCHC: 33.4 g/dL (ref 30.0–36.0)
MCV: 90.3 fL (ref 80.0–100.0)
MCV: 90.5 fL (ref 80.0–100.0)
MCV: 89 fL (ref 80.0–100.0)
Platelets: 268 10*3/uL (ref 150–400)
Platelets: 146 10*3/uL — ABNORMAL LOW (ref 150–400)
Platelets: 161 10*3/uL (ref 150–400)
RBC: 3.4 MIL/uL — ABNORMAL LOW (ref 3.87–5.11)
RBC: 3.26 MIL/uL — ABNORMAL LOW (ref 3.87–5.11)
RBC: 3.9 MIL/uL (ref 3.87–5.11)
RDW: 13.2 % (ref 11.5–15.5)
RDW: 14 % (ref 11.5–15.5)
RDW: 14.4 % (ref 11.5–15.5)
WBC: 8.9 10*3/uL (ref 4.0–10.5)
WBC: 9 10*3/uL (ref 4.0–10.5)
WBC: 10.8 10*3/uL — ABNORMAL HIGH (ref 4.0–10.5)
nRBC: 0 % (ref 0.0–0.2)
nRBC: 0 % (ref 0.0–0.2)
nRBC: 0 % (ref 0.0–0.2)

## 2020-05-17 LAB — POCT I-STAT, CHEM 8
BUN: 20 mg/dL (ref 8–23)
BUN: 17 mg/dL (ref 8–23)
BUN: 18 mg/dL (ref 8–23)
BUN: 17 mg/dL (ref 8–23)
Calcium, Ion: 1.34 mmol/L (ref 1.15–1.40)
Calcium, Ion: 0.95 mmol/L — ABNORMAL LOW (ref 1.15–1.40)
Calcium, Ion: 1.06 mmol/L — ABNORMAL LOW (ref 1.15–1.40)
Calcium, Ion: 0.96 mmol/L — ABNORMAL LOW (ref 1.15–1.40)
Chloride: 108 mmol/L (ref 98–111)
Chloride: 105 mmol/L (ref 98–111)
Chloride: 109 mmol/L (ref 98–111)
Chloride: 108 mmol/L (ref 98–111)
Creatinine, Ser: 0.9 mg/dL (ref 0.44–1.00)
Creatinine, Ser: 0.8 mg/dL (ref 0.44–1.00)
Creatinine, Ser: 0.9 mg/dL (ref 0.44–1.00)
Creatinine, Ser: 0.7 mg/dL (ref 0.44–1.00)
Glucose, Bld: 165 mg/dL — ABNORMAL HIGH (ref 70–99)
Glucose, Bld: 148 mg/dL — ABNORMAL HIGH (ref 70–99)
Glucose, Bld: 131 mg/dL — ABNORMAL HIGH (ref 70–99)
Glucose, Bld: 136 mg/dL — ABNORMAL HIGH (ref 70–99)
HCT: 27 % — ABNORMAL LOW (ref 36.0–46.0)
HCT: 28 % — ABNORMAL LOW (ref 36.0–46.0)
HCT: 28 % — ABNORMAL LOW (ref 36.0–46.0)
HCT: 26 % — ABNORMAL LOW (ref 36.0–46.0)
Hemoglobin: 9.2 g/dL — ABNORMAL LOW (ref 12.0–15.0)
Hemoglobin: 9.5 g/dL — ABNORMAL LOW (ref 12.0–15.0)
Hemoglobin: 9.5 g/dL — ABNORMAL LOW (ref 12.0–15.0)
Hemoglobin: 8.8 g/dL — ABNORMAL LOW (ref 12.0–15.0)
Potassium: 4.1 mmol/L (ref 3.5–5.1)
Potassium: 4.2 mmol/L (ref 3.5–5.1)
Potassium: 5.6 mmol/L — ABNORMAL HIGH (ref 3.5–5.1)
Potassium: 5.4 mmol/L — ABNORMAL HIGH (ref 3.5–5.1)
Sodium: 141 mmol/L (ref 135–145)
Sodium: 143 mmol/L (ref 135–145)
Sodium: 142 mmol/L (ref 135–145)
Sodium: 142 mmol/L (ref 135–145)
TCO2: 24 mmol/L (ref 22–32)
TCO2: 23 mmol/L (ref 22–32)
TCO2: 25 mmol/L (ref 22–32)
TCO2: 24 mmol/L (ref 22–32)

## 2020-05-17 LAB — SURGICAL PCR SCREEN
MRSA, PCR: NEGATIVE
Staphylococcus aureus: NEGATIVE

## 2020-05-17 LAB — POCT I-STAT EG7
Acid-base deficit: 4 mmol/L — ABNORMAL HIGH (ref 0.0–2.0)
Bicarbonate: 24 mmol/L (ref 20.0–28.0)
Calcium, Ion: 0.89 mmol/L — CL (ref 1.15–1.40)
HCT: 26 % — ABNORMAL LOW (ref 36.0–46.0)
Hemoglobin: 8.8 g/dL — ABNORMAL LOW (ref 12.0–15.0)
O2 Saturation: 84 %
Potassium: 4.8 mmol/L (ref 3.5–5.1)
Sodium: 143 mmol/L (ref 135–145)
TCO2: 26 mmol/L (ref 22–32)
pCO2, Ven: 57 mmHg (ref 44.0–60.0)
pH, Ven: 7.233 — ABNORMAL LOW (ref 7.250–7.430)
pO2, Ven: 59 mmHg — ABNORMAL HIGH (ref 32.0–45.0)

## 2020-05-17 LAB — ECHO INTRAOPERATIVE TEE
AR max vel: 1.96 cm2
AV Area VTI: 1.7 cm2
AV Area mean vel: 1.82 cm2
AV Mean grad: 4 mmHg
AV Peak grad: 6.3 mmHg
Ao pk vel: 1.25 m/s
Height: 62.5 in
P 1/2 time: 600 msec
S' Lateral: 1.8 cm
Weight: 2363.2 oz

## 2020-05-17 LAB — BASIC METABOLIC PANEL
Anion gap: 7 (ref 5–15)
Anion gap: 7 (ref 5–15)
BUN: 23 mg/dL (ref 8–23)
BUN: 13 mg/dL (ref 8–23)
CO2: 21 mmol/L — ABNORMAL LOW (ref 22–32)
CO2: 22 mmol/L (ref 22–32)
Calcium: 9.2 mg/dL (ref 8.9–10.3)
Calcium: 7.4 mg/dL — ABNORMAL LOW (ref 8.9–10.3)
Chloride: 109 mmol/L (ref 98–111)
Chloride: 112 mmol/L — ABNORMAL HIGH (ref 98–111)
Creatinine, Ser: 1.23 mg/dL — ABNORMAL HIGH (ref 0.44–1.00)
Creatinine, Ser: 0.85 mg/dL (ref 0.44–1.00)
GFR, Estimated: 46 mL/min — ABNORMAL LOW (ref >60)
GFR, Estimated: >60 mL/min (ref >60)
Glucose, Bld: 201 mg/dL — ABNORMAL HIGH (ref 70–99)
Glucose, Bld: 120 mg/dL — ABNORMAL HIGH (ref 70–99)
Potassium: 4.2 mmol/L (ref 3.5–5.1)
Potassium: 4.3 mmol/L (ref 3.5–5.1)
Sodium: 137 mmol/L (ref 135–145)
Sodium: 141 mmol/L (ref 135–145)

## 2020-05-17 LAB — PLATELET COUNT: Platelets: 184 10*3/uL (ref 150–400)

## 2020-05-17 LAB — GLUCOSE, CAPILLARY
Glucose-Capillary: 198 mg/dL — ABNORMAL HIGH (ref 70–99)
Glucose-Capillary: 147 mg/dL — ABNORMAL HIGH (ref 70–99)

## 2020-05-17 LAB — HEMOGLOBIN AND HEMATOCRIT, BLOOD
HCT: 27.3 % — ABNORMAL LOW (ref 36.0–46.0)
Hemoglobin: 9 g/dL — ABNORMAL LOW (ref 12.0–15.0)

## 2020-05-17 LAB — PROTIME-INR
INR: 1.2 (ref 0.8–1.2)
Prothrombin Time: 15.5 seconds — ABNORMAL HIGH (ref 11.4–15.2)

## 2020-05-17 LAB — MAGNESIUM: Magnesium: 2.9 mg/dL — ABNORMAL HIGH (ref 1.7–2.4)

## 2020-05-17 LAB — APTT: aPTT: 32 seconds (ref 24–36)

## 2020-05-17 LAB — PREPARE RBC (CROSSMATCH)

## 2020-05-17 LAB — HEPARIN LEVEL (UNFRACTIONATED): Heparin Unfractionated: 0.32 IU/mL (ref 0.30–0.70)

## 2020-05-17 SURGERY — CORONARY ARTERY BYPASS GRAFTING (CABG)
Anesthesia: General | Site: Chest | Laterality: Right

## 2020-05-17 MED ORDER — LIDOCAINE 2% (20 MG/ML) 5 ML SYRINGE
INTRAMUSCULAR | Status: AC
  Filled 2020-05-17: qty 5

## 2020-05-17 MED ORDER — FAMOTIDINE IN NACL 20-0.9 MG/50ML-% IV SOLN
20.0000 mg | Freq: Two times a day (BID) | INTRAVENOUS | Status: AC
  Administered 2020-05-17: 20 mg via INTRAVENOUS
  Filled 2020-05-17: qty 50

## 2020-05-17 MED ORDER — DEXMEDETOMIDINE HCL IN NACL 400 MCG/100ML IV SOLN: 0.0000 ug/kg/h | INTRAVENOUS | Status: DC

## 2020-05-17 MED ORDER — ACETAMINOPHEN 500 MG PO TABS
1000.0000 mg | ORAL_TABLET | Freq: Four times a day (QID) | ORAL | Status: AC
  Administered 2020-05-18 – 2020-05-22 (×10): 1000 mg via ORAL
  Filled 2020-05-17 (×11): qty 2

## 2020-05-17 MED ORDER — HEPARIN SODIUM (PORCINE) 1000 UNIT/ML IJ SOLN
INTRAMUSCULAR | Status: DC | PRN
  Administered 2020-05-17: 23000 [IU] via INTRAVENOUS

## 2020-05-17 MED ORDER — SODIUM CHLORIDE 0.9% FLUSH: 3.0000 mL | Freq: Two times a day (BID) | INTRAVENOUS | Status: DC

## 2020-05-17 MED ORDER — BISACODYL 10 MG RE SUPP: 10.0000 mg | Freq: Every day | RECTAL | Status: DC

## 2020-05-17 MED ORDER — CHLORHEXIDINE GLUCONATE 0.12 % MT SOLN: 15.0000 mL | Freq: Once | OROMUCOSAL | Status: DC

## 2020-05-17 MED ORDER — CHLORHEXIDINE GLUCONATE CLOTH 2 % EX PADS
6.0000 | MEDICATED_PAD | Freq: Every day | CUTANEOUS | Status: DC
  Administered 2020-05-17 – 2020-05-23 (×6): 6 via TOPICAL

## 2020-05-17 MED ORDER — VANCOMYCIN HCL 1000 MG IV SOLR
INTRAVENOUS | Status: DC | PRN
  Administered 2020-05-17: 3000 mg

## 2020-05-17 MED ORDER — NOREPINEPHRINE 4 MG/250ML-% IV SOLN: 0.0000 ug/min | INTRAVENOUS | Status: DC

## 2020-05-17 MED ORDER — ACETAMINOPHEN 650 MG RE SUPP
650.0000 mg | Freq: Once | RECTAL | Status: AC
  Administered 2020-05-17: 650 mg via RECTAL
  Filled 2020-05-17: qty 1

## 2020-05-17 MED ORDER — CHLORHEXIDINE GLUCONATE 0.12% ORAL RINSE (MEDLINE KIT)
15.0000 mL | Freq: Two times a day (BID) | OROMUCOSAL | Status: DC
  Administered 2020-05-17: 15 mL via OROMUCOSAL

## 2020-05-17 MED ORDER — NITROGLYCERIN IN D5W 200-5 MCG/ML-% IV SOLN
0.0000 ug/min | INTRAVENOUS | Status: DC
  Administered 2020-05-17: 5 ug/min via INTRAVENOUS
  Administered 2020-05-18: 100 ug/min via INTRAVENOUS
  Filled 2020-05-17 (×3): qty 250

## 2020-05-17 MED ORDER — METOPROLOL TARTRATE 5 MG/5ML IV SOLN
2.5000 mg | INTRAVENOUS | Status: DC | PRN
  Administered 2020-05-17 – 2020-05-18 (×6): 2.5 mg via INTRAVENOUS
  Filled 2020-05-17 (×3): qty 5

## 2020-05-17 MED ORDER — PLATELET RICH PLASMA OPTIME
Status: DC | PRN
  Administered 2020-05-17: 10 mL

## 2020-05-17 MED ORDER — VANCOMYCIN HCL 1000 MG IV SOLR
INTRAVENOUS | Status: AC
  Filled 2020-05-17: qty 3000

## 2020-05-17 MED ORDER — LACTATED RINGERS IV SOLN: INTRAVENOUS | Status: DC | PRN

## 2020-05-17 MED ORDER — OXYCODONE HCL 5 MG PO TABS
5.0000 mg | ORAL_TABLET | ORAL | Status: DC | PRN
  Administered 2020-05-17: 5 mg via ORAL
  Administered 2020-05-18: 10 mg via ORAL
  Administered 2020-05-18: 5 mg via ORAL
  Filled 2020-05-17: qty 2
  Filled 2020-05-17 (×2): qty 1

## 2020-05-17 MED ORDER — MIDAZOLAM HCL (PF) 10 MG/2ML IJ SOLN
INTRAMUSCULAR | Status: AC
  Filled 2020-05-17: qty 2

## 2020-05-17 MED ORDER — ACETAMINOPHEN 160 MG/5ML PO SOLN: 650.0000 mg | Freq: Once | ORAL | Status: AC

## 2020-05-17 MED ORDER — STERILE WATER FOR INJECTION IJ SOLN
INTRAMUSCULAR | Status: DC | PRN
  Administered 2020-05-17: 10 mL

## 2020-05-17 MED ORDER — TRAMADOL HCL 50 MG PO TABS
50.0000 mg | ORAL_TABLET | ORAL | Status: DC | PRN
Start: 2020-05-17 — End: 2020-05-19
  Administered 2020-05-18 (×2): 100 mg via ORAL
  Administered 2020-05-18: 50 mg via ORAL
  Filled 2020-05-17 (×3): qty 2

## 2020-05-17 MED ORDER — ROCURONIUM BROMIDE 10 MG/ML (PF) SYRINGE
PREFILLED_SYRINGE | INTRAVENOUS | Status: AC
  Filled 2020-05-17: qty 10

## 2020-05-17 MED ORDER — ROSUVASTATIN CALCIUM 5 MG PO TABS: 10.0000 mg | ORAL_TABLET | Freq: Every day | ORAL | Status: DC

## 2020-05-17 MED ORDER — PROPOFOL 10 MG/ML IV BOLUS
INTRAVENOUS | Status: DC | PRN
  Administered 2020-05-17: 30 mg via INTRAVENOUS
  Administered 2020-05-17 (×2): 50 mg via INTRAVENOUS
  Administered 2020-05-17: 40 mg via INTRAVENOUS

## 2020-05-17 MED ORDER — FAMOTIDINE IN NACL 20-0.9 MG/50ML-% IV SOLN
INTRAVENOUS | Status: AC
  Administered 2020-05-17: 20 mg
  Filled 2020-05-17: qty 50

## 2020-05-17 MED ORDER — PLASMA-LYTE A IV SOLN: INTRAVENOUS | Status: DC

## 2020-05-17 MED ORDER — PHENYLEPHRINE 40 MCG/ML (10ML) SYRINGE FOR IV PUSH (FOR BLOOD PRESSURE SUPPORT)
PREFILLED_SYRINGE | INTRAVENOUS | Status: DC | PRN
  Administered 2020-05-17: 80 ug via INTRAVENOUS
  Administered 2020-05-17: 40 ug via INTRAVENOUS

## 2020-05-17 MED ORDER — ASPIRIN 81 MG PO CHEW
324.0000 mg | CHEWABLE_TABLET | Freq: Every day | ORAL | Status: DC
  Administered 2020-05-19 – 2020-05-20 (×2): 324 mg
  Filled 2020-05-17 (×2): qty 4

## 2020-05-17 MED ORDER — ALBUMIN HUMAN 5 % IV SOLN
250.0000 mL | INTRAVENOUS | Status: AC | PRN
  Administered 2020-05-17: 12.5 g via INTRAVENOUS

## 2020-05-17 MED ORDER — VANCOMYCIN HCL IN DEXTROSE 1-5 GM/200ML-% IV SOLN
1000.0000 mg | Freq: Once | INTRAVENOUS | Status: AC
  Administered 2020-05-17: 1000 mg via INTRAVENOUS
  Filled 2020-05-17: qty 200

## 2020-05-17 MED ORDER — SODIUM CHLORIDE 0.45 % IV SOLN: INTRAVENOUS | Status: DC | PRN

## 2020-05-17 MED ORDER — ORAL CARE MOUTH RINSE
15.0000 mL | OROMUCOSAL | Status: DC
  Administered 2020-05-17: 15 mL via OROMUCOSAL

## 2020-05-17 MED ORDER — LEVOTHYROXINE SODIUM 75 MCG PO TABS
75.0000 ug | ORAL_TABLET | Freq: Every day | ORAL | Status: DC
  Administered 2020-05-18: 75 ug via ORAL
  Filled 2020-05-17: qty 1

## 2020-05-17 MED ORDER — PROTAMINE SULFATE 10 MG/ML IV SOLN
INTRAVENOUS | Status: AC
  Filled 2020-05-17: qty 25

## 2020-05-17 MED ORDER — THROMBIN 5000 UNITS EX SOLR
INTRAVENOUS | Status: DC | PRN
  Administered 2020-05-17: 2 mL

## 2020-05-17 MED ORDER — ALBUMIN HUMAN 5 % IV SOLN: INTRAVENOUS | Status: DC | PRN

## 2020-05-17 MED ORDER — SODIUM CHLORIDE 0.9% IV SOLUTION: Freq: Once | INTRAVENOUS | Status: DC

## 2020-05-17 MED ORDER — PANTOPRAZOLE SODIUM 40 MG PO TBEC: 40.0000 mg | DELAYED_RELEASE_TABLET | Freq: Every day | ORAL | Status: DC

## 2020-05-17 MED ORDER — STERILE WATER FOR INJECTION IJ SOLN
INTRAMUSCULAR | Status: AC
  Filled 2020-05-17: qty 10

## 2020-05-17 MED ORDER — MORPHINE SULFATE (PF) 2 MG/ML IV SOLN: 1.0000 mg | INTRAVENOUS | Status: DC | PRN

## 2020-05-17 MED ORDER — BUPIVACAINE HCL (PF) 0.5 % IJ SOLN
INTRAMUSCULAR | Status: AC
  Filled 2020-05-17: qty 30

## 2020-05-17 MED ORDER — ACETAMINOPHEN 500 MG PO TABS: 1000.0000 mg | ORAL_TABLET | Freq: Once | ORAL | Status: DC

## 2020-05-17 MED ORDER — PROTAMINE SULFATE 10 MG/ML IV SOLN
INTRAVENOUS | Status: DC | PRN
  Administered 2020-05-17: 210 mg via INTRAVENOUS
  Administered 2020-05-17: 20 mg via INTRAVENOUS

## 2020-05-17 MED ORDER — PROPOFOL 500 MG/50ML IV EMUL
INTRAVENOUS | Status: DC | PRN
  Administered 2020-05-17: 50 ug/kg/min via INTRAVENOUS

## 2020-05-17 MED ORDER — SODIUM CHLORIDE 0.9 % IV SOLN: 250.0000 mL | INTRAVENOUS | Status: DC

## 2020-05-17 MED ORDER — INSULIN REGULAR(HUMAN) IN NACL 100-0.9 UT/100ML-% IV SOLN
INTRAVENOUS | Status: DC
  Filled 2020-05-17: qty 100

## 2020-05-17 MED ORDER — HEMOSTATIC AGENTS (NO CHARGE) OPTIME
TOPICAL | Status: DC | PRN
  Administered 2020-05-17 (×3): 1 via TOPICAL

## 2020-05-17 MED ORDER — PHENYLEPHRINE 40 MCG/ML (10ML) SYRINGE FOR IV PUSH (FOR BLOOD PRESSURE SUPPORT)
PREFILLED_SYRINGE | INTRAVENOUS | Status: AC
  Filled 2020-05-17: qty 10

## 2020-05-17 MED ORDER — 0.9 % SODIUM CHLORIDE (POUR BTL) OPTIME
TOPICAL | Status: DC | PRN
  Administered 2020-05-17: 5000 mL

## 2020-05-17 MED ORDER — COLCHICINE 0.3 MG HALF TABLET
0.3000 mg | ORAL_TABLET | Freq: Two times a day (BID) | ORAL | Status: DC
  Administered 2020-05-18: 0.3 mg via ORAL
  Filled 2020-05-17 (×4): qty 1

## 2020-05-17 MED ORDER — LEVALBUTEROL HCL 0.63 MG/3ML IN NEBU
0.6300 mg | INHALATION_SOLUTION | Freq: Four times a day (QID) | RESPIRATORY_TRACT | Status: DC
  Administered 2020-05-17 – 2020-05-18 (×4): 0.63 mg via RESPIRATORY_TRACT
  Filled 2020-05-17 (×3): qty 3

## 2020-05-17 MED ORDER — PHENYLEPHRINE HCL-NACL 20-0.9 MG/250ML-% IV SOLN: 0.0000 ug/min | INTRAVENOUS | Status: DC

## 2020-05-17 MED ORDER — ASPIRIN EC 325 MG PO TBEC
325.0000 mg | DELAYED_RELEASE_TABLET | Freq: Every day | ORAL | Status: DC
  Administered 2020-05-18 – 2020-05-23 (×4): 325 mg via ORAL
  Filled 2020-05-17 (×4): qty 1

## 2020-05-17 MED ORDER — BUPIVACAINE LIPOSOME 1.3 % IJ SUSP
INTRAMUSCULAR | Status: AC
  Filled 2020-05-17: qty 20

## 2020-05-17 MED ORDER — LACTATED RINGERS IV SOLN: 500.0000 mL | Freq: Once | INTRAVENOUS | Status: DC | PRN

## 2020-05-17 MED ORDER — SODIUM CHLORIDE 0.9% FLUSH
3.0000 mL | INTRAVENOUS | Status: DC | PRN
  Administered 2020-05-19: 3 mL via INTRAVENOUS

## 2020-05-17 MED ORDER — MIDAZOLAM HCL 5 MG/5ML IJ SOLN
INTRAMUSCULAR | Status: DC | PRN
  Administered 2020-05-17 (×2): 1 mg via INTRAVENOUS
  Administered 2020-05-17: 2 mg via INTRAVENOUS
  Administered 2020-05-17: 1 mg via INTRAVENOUS

## 2020-05-17 MED ORDER — ONDANSETRON HCL 4 MG/2ML IJ SOLN
4.0000 mg | Freq: Four times a day (QID) | INTRAMUSCULAR | Status: DC | PRN
  Administered 2020-05-19: 4 mg via INTRAVENOUS
  Filled 2020-05-17: qty 2

## 2020-05-17 MED ORDER — ROCURONIUM BROMIDE 10 MG/ML (PF) SYRINGE
PREFILLED_SYRINGE | INTRAVENOUS | Status: DC | PRN
  Administered 2020-05-17: 100 mg via INTRAVENOUS
  Administered 2020-05-17: 50 mg via INTRAVENOUS

## 2020-05-17 MED ORDER — DEXTROSE 50 % IV SOLN
0.0000 mL | INTRAVENOUS | Status: DC | PRN
Start: 2020-05-17 — End: 2020-05-23

## 2020-05-17 MED ORDER — DOCUSATE SODIUM 100 MG PO CAPS
200.0000 mg | ORAL_CAPSULE | Freq: Every day | ORAL | Status: DC
  Administered 2020-05-18: 200 mg via ORAL
  Filled 2020-05-17 (×2): qty 2

## 2020-05-17 MED ORDER — BISACODYL 5 MG PO TBEC
10.0000 mg | DELAYED_RELEASE_TABLET | Freq: Every day | ORAL | Status: DC
  Administered 2020-05-18 – 2020-05-22 (×4): 10 mg via ORAL
  Filled 2020-05-17 (×4): qty 2

## 2020-05-17 MED ORDER — POTASSIUM CHLORIDE 10 MEQ/50ML IV SOLN: 10.0000 meq | INTRAVENOUS | Status: AC

## 2020-05-17 MED ORDER — LACTATED RINGERS IV SOLN: INTRAVENOUS | Status: DC

## 2020-05-17 MED ORDER — MIDAZOLAM HCL 2 MG/2ML IJ SOLN: 2.0000 mg | INTRAMUSCULAR | Status: DC | PRN

## 2020-05-17 MED ORDER — THROMBIN 5000 UNITS EX SOLR
Freq: Once | INTRAVENOUS | Status: DC
  Filled 2020-05-17 (×2): qty 5000

## 2020-05-17 MED ORDER — HEPARIN SODIUM (PORCINE) 1000 UNIT/ML IJ SOLN
INTRAMUSCULAR | Status: AC
  Filled 2020-05-17: qty 1

## 2020-05-17 MED ORDER — METOPROLOL TARTRATE 12.5 MG HALF TABLET
12.5000 mg | ORAL_TABLET | Freq: Two times a day (BID) | ORAL | Status: DC
  Administered 2020-05-18: 12.5 mg via ORAL
  Filled 2020-05-17: qty 1

## 2020-05-17 MED ORDER — PROPOFOL 10 MG/ML IV BOLUS
INTRAVENOUS | Status: AC
  Filled 2020-05-17: qty 20

## 2020-05-17 MED ORDER — LEVALBUTEROL TARTRATE 45 MCG/ACT IN AERO: 2.0000 | INHALATION_SPRAY | Freq: Four times a day (QID) | RESPIRATORY_TRACT | Status: DC

## 2020-05-17 MED ORDER — FENTANYL CITRATE (PF) 250 MCG/5ML IJ SOLN
INTRAMUSCULAR | Status: DC | PRN
  Administered 2020-05-17 (×3): 100 ug via INTRAVENOUS
  Administered 2020-05-17 (×2): 150 ug via INTRAVENOUS
  Administered 2020-05-17 (×4): 100 ug via INTRAVENOUS
  Administered 2020-05-17: 50 ug via INTRAVENOUS
  Administered 2020-05-17 (×2): 100 ug via INTRAVENOUS

## 2020-05-17 MED ORDER — SODIUM CHLORIDE 0.9 % IV SOLN: INTRAVENOUS | Status: DC

## 2020-05-17 MED ORDER — PLASMA-LYTE 148 IV SOLN
INTRAVENOUS | Status: DC | PRN
  Administered 2020-05-17: 500 mL via INTRAVASCULAR

## 2020-05-17 MED ORDER — METOPROLOL TARTRATE 25 MG/10 ML ORAL SUSPENSION: 12.5000 mg | Freq: Two times a day (BID) | ORAL | Status: DC

## 2020-05-17 MED ORDER — SODIUM CHLORIDE (PF) 0.9 % IJ SOLN
INTRAMUSCULAR | Status: AC
  Filled 2020-05-17: qty 20

## 2020-05-17 MED ORDER — ACETAMINOPHEN 160 MG/5ML PO SOLN
1000.0000 mg | Freq: Four times a day (QID) | ORAL | Status: AC
  Administered 2020-05-19 – 2020-05-22 (×4): 1000 mg
  Filled 2020-05-17 (×4): qty 40.6

## 2020-05-17 MED ORDER — MAGNESIUM SULFATE 4 GM/100ML IV SOLN
4.0000 g | Freq: Once | INTRAVENOUS | Status: AC
Start: 2020-05-17 — End: 2020-05-17
  Administered 2020-05-17: 4 g via INTRAVENOUS
  Filled 2020-05-17: qty 100

## 2020-05-17 MED ORDER — CHLORHEXIDINE GLUCONATE 0.12 % MT SOLN
15.0000 mL | OROMUCOSAL | Status: AC
  Administered 2020-05-17: 15 mL via OROMUCOSAL
  Filled 2020-05-17: qty 15

## 2020-05-17 MED ORDER — FENTANYL CITRATE (PF) 250 MCG/5ML IJ SOLN
INTRAMUSCULAR | Status: AC
  Filled 2020-05-17: qty 25

## 2020-05-17 MED ORDER — PLATELET POOR PLASMA OPTIME
Status: DC | PRN
  Administered 2020-05-17: 10 mL

## 2020-05-17 MED ORDER — SODIUM CHLORIDE 0.9 % IV SOLN
1.5000 g | Freq: Two times a day (BID) | INTRAVENOUS | Status: AC
  Administered 2020-05-17 – 2020-05-19 (×4): 1.5 g via INTRAVENOUS
  Filled 2020-05-17 (×4): qty 1.5

## 2020-05-17 SURGICAL SUPPLY — 89 items
ADAPTER CARDIO PERF ANTE/RETRO (ADAPTER) ×4 IMPLANT
APPLICATOR TIP COSEAL (VASCULAR PRODUCTS) IMPLANT
BAG DECANTER FOR FLEXI CONT (MISCELLANEOUS) ×4 IMPLANT
BLADE CLIPPER SURG (BLADE) ×4 IMPLANT
BLADE STERNUM SYSTEM 6 (BLADE) ×4 IMPLANT
BLADE SURG 11 STRL SS (BLADE) ×4 IMPLANT
BNDG ELASTIC 4X5.8 VLCR STR LF (GAUZE/BANDAGES/DRESSINGS) ×4 IMPLANT
BNDG ELASTIC 6X5.8 VLCR STR LF (GAUZE/BANDAGES/DRESSINGS) ×4 IMPLANT
BNDG GAUZE ELAST 4 BULKY (GAUZE/BANDAGES/DRESSINGS) ×4 IMPLANT
CANISTER SUCT 3000ML PPV (MISCELLANEOUS) ×4 IMPLANT
CANNULA NON VENT 18FR 12 (CANNULA) ×4 IMPLANT
CATH CPB KIT HENDRICKSON (MISCELLANEOUS) ×4 IMPLANT
CATH ROBINSON RED A/P 18FR (CATHETERS) ×8 IMPLANT
CLIP RETRACTION 3.0MM CORONARY (MISCELLANEOUS) ×4 IMPLANT
CLIP VESOCCLUDE SM WIDE 24/CT (CLIP) ×12 IMPLANT
DERMABOND ADVANCED (GAUZE/BANDAGES/DRESSINGS)
DERMABOND ADVANCED .7 DNX12 (GAUZE/BANDAGES/DRESSINGS) IMPLANT
DRAIN CHANNEL 28F RND 3/8 FF (WOUND CARE) ×12 IMPLANT
DRAPE CARDIOVASCULAR INCISE (DRAPES) ×1
DRAPE SLUSH/WARMER DISC (DRAPES) IMPLANT
DRAPE SRG 135X102X78XABS (DRAPES) ×3 IMPLANT
DRSG AQUACEL AG ADV 3.5X14 (GAUZE/BANDAGES/DRESSINGS) ×4 IMPLANT
ELECT CAUTERY BLADE 6.4 (BLADE) ×4 IMPLANT
ELECT REM PT RETURN 9FT ADLT (ELECTROSURGICAL) ×8
ELECTRODE REM PT RTRN 9FT ADLT (ELECTROSURGICAL) ×6 IMPLANT
FELT TEFLON 1X6 (MISCELLANEOUS) ×4 IMPLANT
GAUZE SPONGE 4X4 12PLY STRL (GAUZE/BANDAGES/DRESSINGS) ×8 IMPLANT
GLOVE NEODERM STRL 7.5 LF PF (GLOVE) ×6 IMPLANT
GLOVE SURG NEODERM 7.5  LF PF (GLOVE) ×2
GOWN STRL REUS W/ TWL LRG LVL3 (GOWN DISPOSABLE) ×15 IMPLANT
GOWN STRL REUS W/TWL LRG LVL3 (GOWN DISPOSABLE) ×5
HEMOSTAT POWDER SURGIFOAM 1G (HEMOSTASIS) IMPLANT
INSERT FOGARTY XLG (MISCELLANEOUS) ×4 IMPLANT
INSERT SUTURE HOLDER (MISCELLANEOUS) ×4 IMPLANT
KIT APPLICATOR RATIO 11:1 (KITS) ×4 IMPLANT
KIT BASIN OR (CUSTOM PROCEDURE TRAY) ×4 IMPLANT
KIT SUCTION CATH 14FR (SUCTIONS) ×4 IMPLANT
KIT TURNOVER KIT B (KITS) ×4 IMPLANT
KIT VASOVIEW HEMOPRO 2 VH 4000 (KITS) ×4 IMPLANT
MARKER GRAFT CORONARY BYPASS (MISCELLANEOUS) IMPLANT
NEEDLE 18GX1X1/2 (RX/OR ONLY) (NEEDLE) ×4 IMPLANT
NS IRRIG 1000ML POUR BTL (IV SOLUTION) ×20 IMPLANT
PACK E OPEN HEART (SUTURE) ×4 IMPLANT
PACK OPEN HEART (CUSTOM PROCEDURE TRAY) ×4 IMPLANT
PACK PLATELET PROCEDURE 60 (MISCELLANEOUS) ×4 IMPLANT
PACK SPY-PHI (KITS) ×4 IMPLANT
PAD ARMBOARD 7.5X6 YLW CONV (MISCELLANEOUS) ×8 IMPLANT
PAD ELECT DEFIB RADIOL ZOLL (MISCELLANEOUS) ×4 IMPLANT
PENCIL BUTTON HOLSTER BLD 10FT (ELECTRODE) ×4 IMPLANT
POSITIONER HEAD DONUT 9IN (MISCELLANEOUS) ×4 IMPLANT
POWDER SURGICEL 3.0 GRAM (HEMOSTASIS) ×8 IMPLANT
PUMP SARN DELFIN (MISCELLANEOUS) ×4 IMPLANT
PUNCH AORTIC ROTATE 4.0MM (MISCELLANEOUS) ×4 IMPLANT
SEALANT SURG COSEAL 4ML (VASCULAR PRODUCTS) IMPLANT
SEALANT SURG COSEAL 8ML (VASCULAR PRODUCTS) ×4 IMPLANT
SET CARDIOPLEGIA MPS 5001102 (MISCELLANEOUS) ×4 IMPLANT
SOL ANTI FOG 6CC (MISCELLANEOUS) ×3 IMPLANT
SOLUTION ANTI FOG 6CC (MISCELLANEOUS) ×1
SUT BONE WAX W31G (SUTURE) ×4 IMPLANT
SUT MNCRL AB 3-0 PS2 18 (SUTURE) ×8 IMPLANT
SUT MNCRL AB 4-0 PS2 18 (SUTURE) ×4 IMPLANT
SUT PDS AB 1 CTX 36 (SUTURE) ×8 IMPLANT
SUT PROLENE 3 0 SH DA (SUTURE) ×8 IMPLANT
SUT PROLENE 5 0 C 1 36 (SUTURE) IMPLANT
SUT PROLENE 6 0 C 1 30 (SUTURE) ×8 IMPLANT
SUT PROLENE 8 0 BV175 6 (SUTURE) IMPLANT
SUT PROLENE BLUE 7 0 (SUTURE) ×4 IMPLANT
SUT SILK 2 0 SH CR/8 (SUTURE) IMPLANT
SUT SILK 3 0 SH CR/8 (SUTURE) IMPLANT
SUT STEEL 6MS V (SUTURE) ×4 IMPLANT
SUT STEEL SZ 6 DBL 3X14 BALL (SUTURE) ×4 IMPLANT
SUT VIC AB 2-0 CT1 27 (SUTURE) ×1
SUT VIC AB 2-0 CT1 TAPERPNT 27 (SUTURE) ×3 IMPLANT
SUT VIC AB 2-0 CTX 27 (SUTURE) IMPLANT
SUT VIC AB 3-0 X1 27 (SUTURE) IMPLANT
SYR 10ML LL (SYRINGE) ×4 IMPLANT
SYR 30ML LL (SYRINGE) IMPLANT
SYR 3ML LL SCALE MARK (SYRINGE) IMPLANT
SYSTEM SAHARA CHEST DRAIN ATS (WOUND CARE) ×4 IMPLANT
TAPE CLOTH SURG 4X10 WHT LF (GAUZE/BANDAGES/DRESSINGS) ×4 IMPLANT
TAPE PAPER 2X10 WHT MICROPORE (GAUZE/BANDAGES/DRESSINGS) ×4 IMPLANT
TIP DUAL SPRAY TOPICAL (TIP) ×4 IMPLANT
TOWEL GREEN STERILE (TOWEL DISPOSABLE) ×4 IMPLANT
TOWEL GREEN STERILE FF (TOWEL DISPOSABLE) ×4 IMPLANT
TRAY FOLEY SLVR 16FR TEMP STAT (SET/KITS/TRAYS/PACK) ×4 IMPLANT
TUBING LAP HI FLOW INSUFFLATIO (TUBING) ×4 IMPLANT
UNDERPAD 30X36 HEAVY ABSORB (UNDERPADS AND DIAPERS) ×4 IMPLANT
WATER STERILE IRR 1000ML POUR (IV SOLUTION) ×8 IMPLANT
WATER STERILE IRR 1000ML UROMA (IV SOLUTION) IMPLANT

## 2020-05-17 NOTE — Anesthesia Procedure Notes (Signed)
Procedure Name: Intubation Date/Time: 05/17/2020 8:17 AM Performed by: Reece Agar, CRNA Pre-anesthesia Checklist: Patient identified, Emergency Drugs available, Suction available and Patient being monitored Patient Re-evaluated:Patient Re-evaluated prior to induction Oxygen Delivery Method: Circle System Utilized Preoxygenation: Pre-oxygenation with 100% oxygen Induction Type: IV induction Ventilation: Mask ventilation without difficulty Laryngoscope Size: Mac and 3 Grade View: Grade II Tube type: Oral Tube size: 8.0 mm Number of attempts: 1 Airway Equipment and Method: Stylet and Oral airway Placement Confirmation: ETT inserted through vocal cords under direct vision,  positive ETCO2 and breath sounds checked- equal and bilateral Secured at: 22 cm Tube secured with: Tape Dental Injury: Teeth and Oropharynx as per pre-operative assessment

## 2020-05-17 NOTE — Anesthesia Postprocedure Evaluation (Signed)
Anesthesia Post Note  Patient: Tiffany White  Procedure(s) Performed: CORONARY ARTERY BYPASS GRAFTING (CABG) TIMES FOUR, ON PUMP, USING LEFT INTERNAL MAMMARY ARTERY AND ENDOSCOPICALLY HARVESTED RIGHT GREATER SAPHENOUS VEIN (N/A Chest) TRANSESOPHAGEAL ECHOCARDIOGRAM (TEE) (N/A ) INDOCYANINE GREEN FLUORESCENCE IMAGING (ICG) (N/A ) ENDOVEIN HARVEST OF GREATER SAPHENOUS VEIN (Right )     Patient location during evaluation: SICU Anesthesia Type: General Level of consciousness: sedated Pain management: pain level controlled Vital Signs Assessment: post-procedure vital signs reviewed and stable Respiratory status: patient remains intubated per anesthesia plan Cardiovascular status: stable Postop Assessment: no apparent nausea or vomiting Anesthetic complications: no   No complications documented.  Last Vitals:  Vitals:   05/17/20 0744 05/17/20 1300  BP:    Pulse: 63   Resp: 16   Temp:    SpO2: 98% 100%    Last Pain:  Vitals:   05/17/20 0504  TempSrc: Oral  PainSc:                  Adyen Bifulco,W. EDMOND

## 2020-05-17 NOTE — Plan of Care (Signed)
Discussed with daughter plan of care while her mother is in ICU.  Will d/w pt when she awakens from surgery.

## 2020-05-17 NOTE — Anesthesia Procedure Notes (Signed)
Arterial Line Insertion Start/End4/16/2022 7:05 AM, 05/17/2020 7:15 AM Performed by: Reece Agar, CRNA, CRNA  Patient location: Pre-op. Preanesthetic checklist: patient identified, IV checked, site marked, risks and benefits discussed, surgical consent, monitors and equipment checked, pre-op evaluation, timeout performed and anesthesia consent Lidocaine 1% used for infiltration Left, radial was placed Catheter size: 20 G Hand hygiene performed  and maximum sterile barriers used   Attempts: 2 Procedure performed without using ultrasound guided technique. Following insertion, dressing applied and Biopatch. Post procedure assessment: normal and unchanged

## 2020-05-17 NOTE — Brief Op Note (Signed)
05/12/2020 - 05/17/2020  11:21 AM  PATIENT:  Tiffany White  75 y.o. female  PRE-OPERATIVE DIAGNOSIS:  Coronary Artery Disease, unstable angina  POST-OPERATIVE DIAGNOSIS:  same  PROCEDURE:  Procedure(s): CORONARY ARTERY BYPASS GRAFTING (CABG) TIMES FOUR, ON PUMP, USING LEFT INTERNAL MAMMARY ARTERY AND ENDOSCOPICALLY HARVESTED RIGHT GREATER SAPHENOUS VEIN (N/A) TRANSESOPHAGEAL ECHOCARDIOGRAM (TEE) (N/A) INDOCYANINE GREEN FLUORESCENCE IMAGING (ICG) (N/A) ENDOVEIN HARVEST OF GREATER SAPHENOUS VEIN (Right)   Saphenous vein harvest time: 45 minutes  LIMA to LAD SVG to PDA and PL as sequenced graft SVG to OM1   SURGEON:  Surgeon(s) and Role:    * Wonda Olds, MD - Primary  PHYSICIAN ASSISTANT:  Nicholes Rough, PA-C  ANESTHESIA:   general   BLOOD ADMINISTERED:none  DRAINS: ROUTINE   LOCAL MEDICATIONS USED:  NONE  SPECIMEN:  No Specimen  DISPOSITION OF SPECIMEN:  N/A  COUNTS:  YES  DICTATION: .Dragon Dictation  PLAN OF CARE: Admit to inpatient   PATIENT DISPOSITION:  ICU - intubated and hemodynamically stable.   Delay start of Pharmacological VTE agent (>24hrs) due to surgical blood loss or risk of bleeding: yes

## 2020-05-17 NOTE — Anesthesia Procedure Notes (Signed)
Central Venous Catheter Insertion Performed by: Roderic Palau, MD, anesthesiologist Start/End4/16/2022 7:00 AM, 05/17/2020 7:15 AM Patient location: Pre-op. Preanesthetic checklist: patient identified, IV checked, site marked, risks and benefits discussed, surgical consent, monitors and equipment checked, pre-op evaluation, timeout performed and anesthesia consent Position: Trendelenburg Lidocaine 1% used for infiltration and patient sedated Hand hygiene performed , maximum sterile barriers used  and Seldinger technique used Catheter size: 9 Fr Total catheter length 10. Central line was placed.MAC introducer Procedure performed using ultrasound guided technique. Ultrasound Notes:anatomy identified, needle tip was noted to be adjacent to the nerve/plexus identified, no ultrasound evidence of intravascular and/or intraneural injection and image(s) printed for medical record Attempts: 1 Following insertion, line sutured, dressing applied and Biopatch. Post procedure assessment: blood return through all ports, free fluid flow and no air  Patient tolerated the procedure well with no immediate complications.

## 2020-05-17 NOTE — Transfer of Care (Signed)
Immediate Anesthesia Transfer of Care Note  Patient: Tiffany White  Procedure(s) Performed: CORONARY ARTERY BYPASS GRAFTING (CABG) TIMES FOUR, ON PUMP, USING LEFT INTERNAL MAMMARY ARTERY AND ENDOSCOPICALLY HARVESTED RIGHT GREATER SAPHENOUS VEIN (N/A Chest) TRANSESOPHAGEAL ECHOCARDIOGRAM (TEE) (N/A ) INDOCYANINE GREEN FLUORESCENCE IMAGING (ICG) (N/A ) ENDOVEIN HARVEST OF GREATER SAPHENOUS VEIN (Right )  Patient Location: ICU  Anesthesia Type:General  Level of Consciousness: Patient remains intubated per anesthesia plan  Airway & Oxygen Therapy: Patient remains intubated per anesthesia plan and Patient placed on Ventilator (see vital sign flow sheet for setting)  Post-op Assessment: Report given to RN and Post -op Vital signs reviewed and stable  Post vital signs: Reviewed and stable  Last Vitals:  Vitals Value Taken Time  BP 103/52 05/17/20 1308  Temp 35.6 C 05/17/20 1308  Pulse 80 05/17/20 1308  Resp 16 05/17/20 1308  SpO2 94 % 05/17/20 1308  Vitals shown include unvalidated device data.  Last Pain:  Vitals:   05/17/20 0504  TempSrc: Oral  PainSc:          Complications: No complications documented.

## 2020-05-17 NOTE — Procedures (Signed)
Extubation Procedure Note  Patient Details:   Name: Raveen Wieseler DOB: 09/08/1945 MRN: [id number]   Airway Documentation:    Vent end date: 05/17/20 Vent end time: 2135   Evaluation  O2 sats: stable throughout Complications: No apparent complications Patient did tolerate procedure well. Bilateral Breath Sounds: Clear,Diminished   Yes   Pt extubated to 4L [place] per protocol.  NIF -30 VC: 433ml IS: pt refuses to do due to pain. Positive cuff leak, no stridor present.   Clance Boll 05/17/2020, 9:44 PM

## 2020-05-17 NOTE — Progress Notes (Signed)
This patient was in the OR this AM and has been transitioned from [facility] to CT surgery service after the OR. Please call [facility] if needed.

## 2020-05-17 NOTE — H&P (Signed)
History and Physical Interval Note:  05/17/2020 7:40 AM  Tiffany White  has presented today for surgery, with the diagnosis of CAD.  The various methods of treatment have been discussed with the patient and family. After consideration of risks, benefits and other options for treatment, the patient has consented to  Procedure(s): CORONARY ARTERY BYPASS GRAFTING (CABG) (N/A) TRANSESOPHAGEAL ECHOCARDIOGRAM (TEE) (N/A) INDOCYANINE GREEN FLUORESCENCE IMAGING (ICG) (N/A) as a surgical intervention.  The patient's history has been reviewed, patient examined, no change in status, stable for surgery.  I have reviewed the patient's chart and labs.  Questions were answered to the patient's satisfaction.     Wonda Olds

## 2020-05-17 NOTE — Progress Notes (Signed)
  Echocardiogram Echocardiogram Transesophageal has been performed.  Tiffany White 05/17/2020, 9:08 AM

## 2020-05-17 NOTE — Op Note (Signed)
CARDIOTHORACIC SURGERY OPERATIVE NOTE  Date of Procedure: 05/17/2020  Preoperative Diagnosis: Severe 3-vessel Coronary Artery Disease and unstable angina  Postoperative Diagnosis: Same  Procedure:    Coronary Artery Bypass Grafting x 4  Left Internal Mammary Artery to Distal Left Anterior Descending Coronary Artery; Saphenous Vein Graft to  Posterior Descending Coronary Artery and right PLA as sequenced graft; Saphenous Vein Graft to 1st Obtuse Marginal Branch of Left Circumflex Coronary Artery; Endoscopic Vein Harvest from right Thigh and Lower Leg  Surgeon: B.  Murvin Natal, MD  Assistant: T.  Harriet Pho, PA-C  Anesthesia: General  Operative Findings:  Preserved left ventricular systolic function  Good quality left internal mammary artery conduit  Good quality saphenous vein conduit  Good quality target vessels for grafting    BRIEF CLINICAL NOTE AND INDICATIONS FOR SURGERY  75 year old lady presented with progressive angina which lasted for approximately 1 week and was worse with exertion.  Her evaluation in the emergency department demonstrated NSTEMI.  Patient was transferred for left heart catheterization which was performed several days ago and demonstrated multivessel coronary artery disease.  While awaiting surgery and being treated with IV nitroglycerin and IV heparin she still experienced intermittent chest pain particular with exertion.  She is therefore taken to the operating room urgently for revascularization.  DETAILS OF THE OPERATIVE PROCEDURE  Preparation:  The patient is brought to the operating room on the above mentioned date and central monitoring was established by the anesthesia team including placement of Swan-Ganz catheter and radial arterial line. The patient is placed in the supine position on the operating table.  Intravenous antibiotics are administered. General endotracheal anesthesia is induced uneventfully. A Foley catheter is placed.  Baseline  transesophageal echocardiogram was performed.  Findings were notable for preserved LV function and LV hypertrophy, mild aortic valve insufficiency  The patient's chest, abdomen, both groins, and both lower extremities are prepared and draped in a sterile manner. A time out procedure is performed.   Surgical Approach and Conduit Harvest:  A median sternotomy incision was performed and the left internal mammary artery is dissected from the chest wall and prepared for bypass grafting. The left internal mammary artery is notably good quality conduit. Simultaneously, the greater saphenous vein is obtained from the patient's right thigh using endoscopic vein harvest technique. The saphenous vein is notably good quality conduit. After removal of the saphenous vein, the small surgical incisions in the lower extremity are closed with absorbable suture. Following systemic heparinization, the left internal mammary artery was transected distally noted to have excellent flow.   Extracorporeal Cardiopulmonary Bypass and Myocardial Protection:  The pericardium is opened. The ascending aorta is nondiseased in appearance. The ascending aorta and the right atrium are cannulated for cardiopulmonary bypass.  Adequate heparinization is verified.    The entire pre-bypass portion of the operation was notable for stable hemodynamics.  Cardiopulmonary bypass was begun and the surface of the heart is inspected. Distal target vessels are selected for coronary artery bypass grafting. A cardioplegia cannula is placed in the ascending aorta.    The patient is allowed to cool passively to 35C systemic temperature.  The aortic cross clamp is applied and cold blood cardioplegia is delivered initially in an antegrade fashion through the aortic root. The initial cardioplegic arrest is rapid with early diastolic arrest.  Repeat doses of cardioplegia are administered intermittently throughout the entire cross clamp portion of the  operation through the aortic root and through subsequently placed vein grafts in order to maintain completely  flat electrocardiogram.   Coronary Artery Bypass Grafting:   The  posterior descending branch of the right coronary artery and the right posterior lateral artery were grafted using a reversed saphenous vein graft in a sequenced fashion.  At the site of distal anastomoses the target vessels were good quality and measured approximately 1.5 mm in diameter.  The first obtuse marginal branch of the left circumflex coronary artery was grafted using a reversed saphenous vein graft in an end-to-side fashion.  At the site of distal anastomosis the target vessel was good quality and measured approximately 1.5 mm in diameter.    The distal left anterior coronary artery was grafted with the left internal mammary artery in an end-to-side fashion.  At the site of distal anastomosis the target vessel was good quality and measured approximately 1.5 mm in diameter.  All proximal vein graft anastomoses were placed directly to the ascending aorta prior to removal of the aortic cross clamp.  A reanimation dose of cardioplegia was given down the aortic root.  De-airing procedures were performed and the aortic cross-clamp was removed.   Procedure Completion:  All proximal and distal coronary anastomoses were inspected for hemostasis and appropriate graft orientation. Epicardial pacing wires are fixed to the right ventricular outflow tract and to the right atrial appendage. The patient is rewarmed to 37C temperature. The patient is weaned and disconnected from cardiopulmonary bypass.  The patient's rhythm at separation from bypass was NSR.  The patient was weaned from cardiopulmonary bypass  without any inotropic support.   Followup transesophageal echocardiogram performed after separation from bypass revealed no changes from the preoperative exam.  The aortic and venous cannula were removed uneventfully.  Protamine was administered to reverse the anticoagulation. The mediastinum and pleural space were inspected for hemostasis and irrigated with saline solution. The mediastinum and both pleural spaces were drained using fluted chest tubes placed through separate stab incisions inferiorly.  The soft tissues anterior to the aorta were reapproximated loosely. The sternum is closed with double strength sternal wire. The soft tissues anterior to the sternum were closed in multiple layers and the skin is closed with a running subcuticular skin closure.  The post-bypass portion of the operation was notable for stable rhythm and hemodynamics.     Disposition:  The patient tolerated the procedure well and is transported to the surgical intensive care in stable condition. There are no intraoperative complications. All sponge instrument and needle counts are verified correct at completion of the operation.    Jayme Cloud, MD 05/17/2020 12:25 PM

## 2020-05-17 NOTE — Anesthesia Procedure Notes (Signed)
Central Venous Catheter Insertion Performed by: Roderic Palau, MD, anesthesiologist Start/End4/16/2022 7:00 AM, 05/17/2020 7:15 AM Patient location: Pre-op. Preanesthetic checklist: patient identified, IV checked, site marked, risks and benefits discussed, surgical consent, monitors and equipment checked, pre-op evaluation, timeout performed and anesthesia consent Hand hygiene performed  and maximum sterile barriers used  PA cath was placed.Swan type:thermodilution PA Cath depth:50 Procedure performed without using ultrasound guided technique. Attempts: 1 Patient tolerated the procedure well with no immediate complications.

## 2020-05-18 ENCOUNTER — Inpatient Hospital Stay (HOSPITAL_COMMUNITY): Payer: Medicare HMO

## 2020-05-18 LAB — BPAM RBC
Blood Product Expiration Date: 202205132359
Blood Product Expiration Date: 202205132359
Blood Product Expiration Date: 202205172359
Blood Product Expiration Date: 202205172359
ISSUE DATE / TIME: 202204160724
ISSUE DATE / TIME: 202204160724
Unit Type and Rh: 5100
Unit Type and Rh: 5100
Unit Type and Rh: 5100
Unit Type and Rh: 5100

## 2020-05-18 LAB — CBC
HCT: 33 % — ABNORMAL LOW (ref 36.0–46.0)
HCT: 30.4 % — ABNORMAL LOW (ref 36.0–46.0)
Hemoglobin: 11 g/dL — ABNORMAL LOW (ref 12.0–15.0)
Hemoglobin: 10 g/dL — ABNORMAL LOW (ref 12.0–15.0)
MCH: 29.7 pg (ref 26.0–34.0)
MCH: 29.8 pg (ref 26.0–34.0)
MCHC: 33.3 g/dL (ref 30.0–36.0)
MCHC: 32.9 g/dL (ref 30.0–36.0)
MCV: 89.2 fL (ref 80.0–100.0)
MCV: 90.5 fL (ref 80.0–100.0)
Platelets: 178 10*3/uL (ref 150–400)
Platelets: 166 10*3/uL (ref 150–400)
RBC: 3.7 MIL/uL — ABNORMAL LOW (ref 3.87–5.11)
RBC: 3.36 MIL/uL — ABNORMAL LOW (ref 3.87–5.11)
RDW: 14.4 % (ref 11.5–15.5)
RDW: 14.6 % (ref 11.5–15.5)
WBC: 12.5 10*3/uL — ABNORMAL HIGH (ref 4.0–10.5)
WBC: 12 10*3/uL — ABNORMAL HIGH (ref 4.0–10.5)
nRBC: 0 % (ref 0.0–0.2)
nRBC: 0 % (ref 0.0–0.2)

## 2020-05-18 LAB — GLUCOSE, CAPILLARY
Glucose-Capillary: 103 mg/dL — ABNORMAL HIGH (ref 70–99)
Glucose-Capillary: 114 mg/dL — ABNORMAL HIGH (ref 70–99)
Glucose-Capillary: 117 mg/dL — ABNORMAL HIGH (ref 70–99)
Glucose-Capillary: 119 mg/dL — ABNORMAL HIGH (ref 70–99)
Glucose-Capillary: 120 mg/dL — ABNORMAL HIGH (ref 70–99)
Glucose-Capillary: 123 mg/dL — ABNORMAL HIGH (ref 70–99)
Glucose-Capillary: 127 mg/dL — ABNORMAL HIGH (ref 70–99)
Glucose-Capillary: 127 mg/dL — ABNORMAL HIGH (ref 70–99)
Glucose-Capillary: 128 mg/dL — ABNORMAL HIGH (ref 70–99)
Glucose-Capillary: 129 mg/dL — ABNORMAL HIGH (ref 70–99)
Glucose-Capillary: 132 mg/dL — ABNORMAL HIGH (ref 70–99)
Glucose-Capillary: 132 mg/dL — ABNORMAL HIGH (ref 70–99)
Glucose-Capillary: 132 mg/dL — ABNORMAL HIGH (ref 70–99)
Glucose-Capillary: 134 mg/dL — ABNORMAL HIGH (ref 70–99)
Glucose-Capillary: 140 mg/dL — ABNORMAL HIGH (ref 70–99)
Glucose-Capillary: 141 mg/dL — ABNORMAL HIGH (ref 70–99)
Glucose-Capillary: 142 mg/dL — ABNORMAL HIGH (ref 70–99)
Glucose-Capillary: 144 mg/dL — ABNORMAL HIGH (ref 70–99)
Glucose-Capillary: 145 mg/dL — ABNORMAL HIGH (ref 70–99)
Glucose-Capillary: 147 mg/dL — ABNORMAL HIGH (ref 70–99)
Glucose-Capillary: 147 mg/dL — ABNORMAL HIGH (ref 70–99)
Glucose-Capillary: 147 mg/dL — ABNORMAL HIGH (ref 70–99)
Glucose-Capillary: 148 mg/dL — ABNORMAL HIGH (ref 70–99)
Glucose-Capillary: 149 mg/dL — ABNORMAL HIGH (ref 70–99)
Glucose-Capillary: 154 mg/dL — ABNORMAL HIGH (ref 70–99)
Glucose-Capillary: 155 mg/dL — ABNORMAL HIGH (ref 70–99)

## 2020-05-18 LAB — BASIC METABOLIC PANEL
Anion gap: 8 (ref 5–15)
Anion gap: 6 (ref 5–15)
BUN: 15 mg/dL (ref 8–23)
BUN: 15 mg/dL (ref 8–23)
CO2: 21 mmol/L — ABNORMAL LOW (ref 22–32)
CO2: 23 mmol/L (ref 22–32)
Calcium: 7.4 mg/dL — ABNORMAL LOW (ref 8.9–10.3)
Calcium: 7.3 mg/dL — ABNORMAL LOW (ref 8.9–10.3)
Chloride: 111 mmol/L (ref 98–111)
Chloride: 108 mmol/L (ref 98–111)
Creatinine, Ser: 0.98 mg/dL (ref 0.44–1.00)
Creatinine, Ser: 1.04 mg/dL — ABNORMAL HIGH (ref 0.44–1.00)
GFR, Estimated: >60 mL/min (ref >60)
GFR, Estimated: 56 mL/min — ABNORMAL LOW (ref >60)
Glucose, Bld: 155 mg/dL — ABNORMAL HIGH (ref 70–99)
Glucose, Bld: 133 mg/dL — ABNORMAL HIGH (ref 70–99)
Potassium: 4 mmol/L (ref 3.5–5.1)
Potassium: 4.1 mmol/L (ref 3.5–5.1)
Sodium: 140 mmol/L (ref 135–145)
Sodium: 137 mmol/L (ref 135–145)

## 2020-05-18 LAB — TYPE AND SCREEN
ABO/RH(D): O POS
Antibody Screen: NEGATIVE
Unit division: 0
Unit division: 0
Unit division: 0
Unit division: 0

## 2020-05-18 LAB — POCT I-STAT 7, (LYTES, BLD GAS, ICA,H+H)
Acid-base deficit: 3 mmol/L — ABNORMAL HIGH (ref 0.0–2.0)
Acid-base deficit: 4 mmol/L — ABNORMAL HIGH (ref 0.0–2.0)
Bicarbonate: 22.9 mmol/L (ref 20.0–28.0)
Bicarbonate: 21.1 mmol/L (ref 20.0–28.0)
Calcium, Ion: 1 mmol/L — ABNORMAL LOW (ref 1.15–1.40)
Calcium, Ion: 1.1 mmol/L — ABNORMAL LOW (ref 1.15–1.40)
HCT: 25 % — ABNORMAL LOW (ref 36.0–46.0)
HCT: 29 % — ABNORMAL LOW (ref 36.0–46.0)
Hemoglobin: 8.5 g/dL — ABNORMAL LOW (ref 12.0–15.0)
Hemoglobin: 9.9 g/dL — ABNORMAL LOW (ref 12.0–15.0)
O2 Saturation: 95 %
O2 Saturation: 95 %
Patient temperature: 35.7
Patient temperature: 36.7
Potassium: 4.2 mmol/L (ref 3.5–5.1)
Potassium: 4 mmol/L (ref 3.5–5.1)
Sodium: 144 mmol/L (ref 135–145)
Sodium: 140 mmol/L (ref 135–145)
TCO2: 24 mmol/L (ref 22–32)
TCO2: 22 mmol/L (ref 22–32)
pCO2 arterial: 39.3 mmHg (ref 32.0–48.0)
pCO2 arterial: 37.2 mmHg (ref 32.0–48.0)
pH, Arterial: 7.366 (ref 7.350–7.450)
pH, Arterial: 7.36 (ref 7.350–7.450)
pO2, Arterial: 75 mmHg — ABNORMAL LOW (ref 83.0–108.0)
pO2, Arterial: 76 mmHg — ABNORMAL LOW (ref 83.0–108.0)

## 2020-05-18 LAB — PREPARE FRESH FROZEN PLASMA: Unit division: 0

## 2020-05-18 LAB — BPAM FFP
Blood Product Expiration Date: 202204202359
Blood Product Expiration Date: 202204202359
ISSUE DATE / TIME: 202204161203
ISSUE DATE / TIME: 202204161203
Unit Type and Rh: 6200
Unit Type and Rh: 6200

## 2020-05-18 LAB — MAGNESIUM
Magnesium: 2.7 mg/dL — ABNORMAL HIGH (ref 1.7–2.4)
Magnesium: 2.6 mg/dL — ABNORMAL HIGH (ref 1.7–2.4)

## 2020-05-18 MED ORDER — PANTOPRAZOLE SODIUM 40 MG IV SOLR
40.0000 mg | Freq: Once | INTRAVENOUS | Status: AC
  Administered 2020-05-18: 40 mg via INTRAVENOUS
  Filled 2020-05-18: qty 40

## 2020-05-18 MED ORDER — ACETAMINOPHEN 650 MG RE SUPP: 650.0000 mg | Freq: Four times a day (QID) | RECTAL | Status: DC | PRN

## 2020-05-18 MED ORDER — FUROSEMIDE 10 MG/ML IJ SOLN
40.0000 mg | Freq: Two times a day (BID) | INTRAMUSCULAR | Status: DC
  Administered 2020-05-18 – 2020-05-20 (×4): 40 mg via INTRAVENOUS
  Filled 2020-05-18 (×4): qty 4

## 2020-05-18 MED ORDER — LEVALBUTEROL HCL 0.63 MG/3ML IN NEBU: 0.6300 mg | INHALATION_SOLUTION | Freq: Four times a day (QID) | RESPIRATORY_TRACT | Status: DC | PRN

## 2020-05-18 MED ORDER — THIAMINE HCL 100 MG/ML IJ SOLN
Freq: Once | INTRAVENOUS | Status: AC
  Filled 2020-05-18: qty 1000

## 2020-05-18 MED ORDER — METOPROLOL TARTRATE 5 MG/5ML IV SOLN
5.0000 mg | Freq: Four times a day (QID) | INTRAVENOUS | Status: DC
  Administered 2020-05-18 – 2020-05-22 (×15): 5 mg via INTRAVENOUS
  Filled 2020-05-18 (×15): qty 5

## 2020-05-18 MED ORDER — HYDRALAZINE HCL 20 MG/ML IJ SOLN
5.0000 mg | Freq: Four times a day (QID) | INTRAMUSCULAR | Status: DC
  Administered 2020-05-18 – 2020-05-23 (×18): 5 mg via INTRAVENOUS
  Filled 2020-05-18 (×20): qty 1

## 2020-05-18 NOTE — Progress Notes (Signed)
1 Day Post-Op Procedure(s) (LRB): CORONARY ARTERY BYPASS GRAFTING (CABG) TIMES FOUR, ON PUMP, USING LEFT INTERNAL MAMMARY ARTERY AND ENDOSCOPICALLY HARVESTED RIGHT GREATER SAPHENOUS VEIN (N/A) TRANSESOPHAGEAL ECHOCARDIOGRAM (TEE) (N/A) INDOCYANINE GREEN FLUORESCENCE IMAGING (ICG) (N/A) ENDOVEIN HARVEST OF GREATER SAPHENOUS VEIN (Right) Subjective: No complaints  Objective: Vital signs in last 24 hours: Temp:  [95.72 F (35.4 C)-99.68 F (37.6 C)] 97.7 F (36.5 C) (04/17 0700) Pulse Rate:  [58-89] 80 (04/17 0700) Cardiac Rhythm: Atrial paced (04/16 2000) Resp:  [12-38] 18 (04/17 0700) BP: (105-153)/(50-86) 153/60 (04/17 0812) SpO2:  [93 %-100 %] 100 % (04/17 0700) Arterial Line BP: (89-161)/(45-80) 145/59 (04/17 0700) FiO2 (%):  [40 %-50 %] 40 % (04/16 2100) Weight:  [68.9 kg] 68.9 kg (04/17 0500)  Hemodynamic parameters for last 24 hours: PAP: (-31-24)/(-34-16) 24/16 CVP:  [0 mmHg-18 mmHg] 13 mmHg  Intake/Output from previous day: 04/16 0701 - 04/17 0700 In: 5617.1 [I.V.:3916.2; Blood:607; IV Piggyback:1093.9] Out: 1497 [Urine:3860; Blood:400; Chest Tube:787] Intake/Output this shift: No intake/output data recorded.  General appearance: cooperative and fatigued Neurologic: intact Heart: regular rate and rhythm, S1, S2 normal, no murmur, click, rub or gallop Lungs: clear to auscultation bilaterally Abdomen: soft, non-tender; bowel sounds normal; no masses,  no organomegaly Extremities: extremities normal, atraumatic, no cyanosis or edema Wound: dressed, dry  Lab Results: Recent Labs    05/17/20 1900 05/17/20 2124 05/17/20 2258 05/18/20 0259  WBC 10.8*  --   --  12.5*  HGB 11.6*   < > 11.2* 11.0*  HCT 34.7*   < > 33.0* 33.0*  PLT 161  --   --  178   < > = values in this interval not displayed.   BMET:  Recent Labs    05/17/20 1900 05/17/20 2124 05/17/20 2258 05/18/20 0259  NA 141   < > 141 140  K 4.3   < > 4.3 4.0  CL 112*  --   --  111  CO2 22  --   --   21*  GLUCOSE 120*  --   --  155*  BUN 13  --   --  15  CREATININE 0.85  --   --  0.98  CALCIUM 7.4*  --   --  7.4*   < > = values in this interval not displayed.    PT/INR:  Recent Labs    05/17/20 1315  LABPROT 15.5*  INR 1.2   ABG    Component Value Date/Time   PHART 7.377 05/17/2020 2258   HCO3 21.8 05/17/2020 2258   TCO2 23 05/17/2020 2258   ACIDBASEDEF 3.0 (H) 05/17/2020 2258   O2SAT 97.0 05/17/2020 2258   CBG (last 3)  Recent Labs    05/18/20 0529 05/18/20 0633 05/18/20 0734  GLUCAP 140* 144* 132*    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, ON PUMP, USING LEFT INTERNAL MAMMARY ARTERY AND ENDOSCOPICALLY HARVESTED RIGHT GREATER SAPHENOUS VEIN (N/A) TRANSESOPHAGEAL ECHOCARDIOGRAM (TEE) (N/A) INDOCYANINE GREEN FLUORESCENCE IMAGING (ICG) (N/A) ENDOVEIN HARVEST OF GREATER SAPHENOUS VEIN (Right) Mobilize out of bed  Hold pain meds until more awake Pulm toilet   LOS: 4 days    Tiffany White 05/18/2020

## 2020-05-18 NOTE — Evaluation (Signed)
Clinical/Bedside Swallow Evaluation Patient Details  Name: Tiffany White MRN: [id number] Date of Birth: 12/28/1945  Today's Date: 05/18/2020 Time: SLP Start Time (ACUTE ONLY): 1148 SLP Stop Time (ACUTE ONLY): 1205 SLP Time Calculation (min) (ACUTE ONLY): 17 min  Past Medical History:  Past Medical History:  Diagnosis Date  . Diabetes mellitus without complication ([place])   . Hyperlipidemia   . Thyroid disease    Past Surgical History:  Past Surgical History:  Procedure Laterality Date  . ABDOMINAL HYSTERECTOMY    . APPENDECTOMY    . LEFT HEART CATH AND CORONARY ANGIOGRAPHY N/A 05/14/2020   Procedure: LEFT HEART CATH AND CORONARY ANGIOGRAPHY;  Surgeon: Troy Sine, MD;  Location: [facility];  Service: Cardiovascular;  Laterality: N/A;  . TONSILLECTOMY     HPI:  Tiffany White is a 75 y.o. female with medical history significant for hypertension, T2DM, hypothyroidism who presents to the emergency department due to 7-10-day onset of intermittent chest pain.  Pt underwent CABG on 4/16 and was intubated for procedure.   Assessment / Plan / Recommendation Clinical Impression  Pt was seen for a bedside swallow evaluation in the setting of extubation following CABG.  RN reported that pt had difficulty consuming medications crushed in puree this AM.  Pt was lethargic throughout this evaluation, but she followed directions and participated well.  Oral mechanism examination was unremarkable.  Pt consumed trials of ice chips, thin liquid, puree, and regular solids.  Pt exhibited moderately prolonged mastication of regular solids with  minimal oral residue following swallow initiation.  A delayed throat clear was noted following 2/4 puree trials; otherwise no overt s/sx of aspiration were observed with any PO trials.  Recommend diet initiation of Dysphagia 1 (puree) solids and thin liquids with medications administered crushed in puree.  SLP will f/u to monitor diet tolerance and to determine  readiness for clinical diet upgrade.  SLP Visit Diagnosis: Dysphagia, unspecified (R13.10)    Aspiration Risk  Mild aspiration risk    Diet Recommendation Dysphagia 1 (Puree);Thin liquid   Liquid Administration via: Cup;Straw Medication Administration: Crushed with puree Supervision: Staff to assist with self feeding;Intermittent supervision to cue for compensatory strategies Compensations: Minimize environmental distractions;Slow rate;Small sips/bites Postural Changes: Seated upright at 90 degrees    Other  Recommendations Oral Care Recommendations: Oral care BID   Follow up Recommendations Other (comment) (TBD)      Frequency and Duration min 2x/week  2 weeks       Prognosis Prognosis for Safe Diet Advancement: Good      Swallow Study   General HPI: Tiffany White is a 75 y.o. female with medical history significant for hypertension, T2DM, hypothyroidism who presents to the emergency department due to 7-10-day onset of intermittent chest pain.  Pt underwent CABG on 4/16 and was intubated for procedure. Type of Study: Bedside Swallow Evaluation Previous Swallow Assessment: None Diet Prior to this Study: NPO Temperature Spikes Noted: Yes Respiratory Status: Nasal cannula History of Recent Intubation: Yes Length of Intubations (days):  (<1) Date extubated: 05/17/20 Behavior/Cognition: Lethargic/Drowsy;Cooperative;Pleasant mood Oral Cavity Assessment: Within Functional Limits Oral Care Completed by SLP: No Oral Cavity - Dentition: Adequate natural dentition Vision: Functional for self-feeding Self-Feeding Abilities: Able to feed self;Needs set up Patient Positioning: Upright in chair Baseline Vocal Quality: Normal Volitional Swallow: Able to elicit    Oral/Motor/Sensory Function Overall Oral Motor/Sensory Function: Within functional limits   Ice Chips Ice chips: Within functional limits Presentation: Spoon   Thin Liquid Thin Liquid: Within  functional  limits Presentation: Straw;Spoon    Nectar Thick Nectar Thick Liquid: Not tested   Honey Thick Honey Thick Liquid: Not tested   Puree Puree: Impaired Presentation: Spoon Oral Phase Functional Implications: Prolonged oral transit Pharyngeal Phase Impairments: Throat Clearing - Delayed   Solid     Solid: Impaired Presentation: Self Fed Oral Phase Impairments: Impaired mastication Oral Phase Functional Implications: Impaired mastication;Prolonged oral transit     Tiffany White M.S., CCC-SLP Acute Rehabilitation Services Office: [phone]  Elvia Collum Nikoli Nasser 05/18/2020,12:28 PM

## 2020-05-18 NOTE — Progress Notes (Signed)
  Speech Language Pathology  Patient Details Name: Rashel Okeefe MRN: [id number] DOB: September 01, 1945 Today's Date: 05/18/2020  Assessment / Plan / Recommendation Clinical Impression  Upon re-entry to patient's room to hang signage with diet recommendations, RN was in room and stated that patient exhibited gagging episode with regurgitation of previously consumed PO (during BSE).  This occurred >10 minutes following PO intake.  Given this and previous occurrences of regurgitation this AM, recommend diet change to NPO with small sips of water as tolerated.  Suspect a GI etiology vs dysphagia; however, pt is at an increased risk for post-prandial aspiration given regurgitation.  SLP will f/u per POC.    HPI HPI: Anjanae Woehrle is a 75 y.o. female with medical history significant for hypertension, T2DM, hypothyroidism who presents to the emergency department due to 7-10-day onset of intermittent chest pain.  Pt underwent CABG on 4/16 and was intubated for procedure.    Elvia Collum Nashia Remus 05/18/2020, 12:45 PM

## 2020-05-19 ENCOUNTER — Inpatient Hospital Stay (HOSPITAL_COMMUNITY): Payer: Medicare HMO

## 2020-05-19 ENCOUNTER — Encounter (HOSPITAL_COMMUNITY): Payer: Self-pay | Admitting: Cardiothoracic Surgery

## 2020-05-19 ENCOUNTER — Inpatient Hospital Stay: Payer: Self-pay

## 2020-05-19 LAB — BASIC METABOLIC PANEL
Anion gap: 10 (ref 5–15)
BUN: 18 mg/dL (ref 8–23)
CO2: 20 mmol/L — ABNORMAL LOW (ref 22–32)
Calcium: 7.8 mg/dL — ABNORMAL LOW (ref 8.9–10.3)
Chloride: 107 mmol/L (ref 98–111)
Creatinine, Ser: 1.15 mg/dL — ABNORMAL HIGH (ref 0.44–1.00)
GFR, Estimated: 50 mL/min — ABNORMAL LOW (ref >60)
Glucose, Bld: 165 mg/dL — ABNORMAL HIGH (ref 70–99)
Potassium: 4.3 mmol/L (ref 3.5–5.1)
Sodium: 137 mmol/L (ref 135–145)

## 2020-05-19 LAB — COMPREHENSIVE METABOLIC PANEL
ALT: 19 U/L (ref 0–44)
AST: 23 U/L (ref 15–41)
Albumin: 2.8 g/dL — ABNORMAL LOW (ref 3.5–5.0)
Alkaline Phosphatase: 53 U/L (ref 38–126)
Anion gap: 9 (ref 5–15)
BUN: 18 mg/dL (ref 8–23)
CO2: 27 mmol/L (ref 22–32)
Calcium: 7.9 mg/dL — ABNORMAL LOW (ref 8.9–10.3)
Chloride: 103 mmol/L (ref 98–111)
Creatinine, Ser: 1.16 mg/dL — ABNORMAL HIGH (ref 0.44–1.00)
GFR, Estimated: 49 mL/min — ABNORMAL LOW (ref >60)
Glucose, Bld: 233 mg/dL — ABNORMAL HIGH (ref 70–99)
Potassium: 3.2 mmol/L — ABNORMAL LOW (ref 3.5–5.1)
Sodium: 139 mmol/L (ref 135–145)
Total Bilirubin: 0.6 mg/dL (ref 0.3–1.2)
Total Protein: 6 g/dL — ABNORMAL LOW (ref 6.5–8.1)

## 2020-05-19 LAB — CBC
HCT: 32.8 % — ABNORMAL LOW (ref 36.0–46.0)
Hemoglobin: 10.6 g/dL — ABNORMAL LOW (ref 12.0–15.0)
MCH: 29.4 pg (ref 26.0–34.0)
MCHC: 32.3 g/dL (ref 30.0–36.0)
MCV: 91.1 fL (ref 80.0–100.0)
Platelets: 171 10*3/uL (ref 150–400)
RBC: 3.6 MIL/uL — ABNORMAL LOW (ref 3.87–5.11)
RDW: 14.9 % (ref 11.5–15.5)
WBC: 12 10*3/uL — ABNORMAL HIGH (ref 4.0–10.5)
nRBC: 0 % (ref 0.0–0.2)

## 2020-05-19 LAB — GLUCOSE, CAPILLARY
Glucose-Capillary: 165 mg/dL — ABNORMAL HIGH (ref 70–99)
Glucose-Capillary: 169 mg/dL — ABNORMAL HIGH (ref 70–99)
Glucose-Capillary: 178 mg/dL — ABNORMAL HIGH (ref 70–99)
Glucose-Capillary: 210 mg/dL — ABNORMAL HIGH (ref 70–99)
Glucose-Capillary: 198 mg/dL — ABNORMAL HIGH (ref 70–99)

## 2020-05-19 LAB — PHOSPHORUS: Phosphorus: 2.4 mg/dL — ABNORMAL LOW (ref 2.5–4.6)

## 2020-05-19 LAB — AMMONIA: Ammonia: 19 umol/L (ref 9–35)

## 2020-05-19 LAB — MAGNESIUM: Magnesium: 2.1 mg/dL (ref 1.7–2.4)

## 2020-05-19 MED ORDER — INSULIN ASPART 100 UNIT/ML ~~LOC~~ SOLN
0.0000 [IU] | SUBCUTANEOUS | Status: DC
  Administered 2020-05-19: 8 [IU] via SUBCUTANEOUS
  Administered 2020-05-19 (×3): 4 [IU] via SUBCUTANEOUS
  Administered 2020-05-20 (×3): 2 [IU] via SUBCUTANEOUS
  Administered 2020-05-20: 4 [IU] via SUBCUTANEOUS
  Administered 2020-05-20: 2 [IU] via SUBCUTANEOUS
  Administered 2020-05-21: 8 [IU] via SUBCUTANEOUS
  Administered 2020-05-21: 2 [IU] via SUBCUTANEOUS
  Administered 2020-05-21: 12 [IU] via SUBCUTANEOUS
  Administered 2020-05-21: 8 [IU] via SUBCUTANEOUS
  Administered 2020-05-22: 12 [IU] via SUBCUTANEOUS
  Administered 2020-05-22: 2 [IU] via SUBCUTANEOUS
  Administered 2020-05-22: 8 [IU] via SUBCUTANEOUS
  Administered 2020-05-22 – 2020-05-23 (×3): 2 [IU] via SUBCUTANEOUS

## 2020-05-19 MED ORDER — SODIUM CHLORIDE 0.9% FLUSH
10.0000 mL | Freq: Two times a day (BID) | INTRAVENOUS | Status: DC
  Administered 2020-05-19 – 2020-05-20 (×3): 10 mL

## 2020-05-19 MED ORDER — SODIUM CHLORIDE 0.9% FLUSH: 10.0000 mL | INTRAVENOUS | Status: DC | PRN

## 2020-05-19 MED ORDER — TRAMADOL HCL 50 MG PO TABS
50.0000 mg | ORAL_TABLET | ORAL | Status: DC | PRN
  Administered 2020-05-20: 50 mg
  Filled 2020-05-19: qty 1

## 2020-05-19 MED ORDER — PROSOURCE TF PO LIQD
45.0000 mL | Freq: Every day | ORAL | Status: DC
  Administered 2020-05-19: 45 mL
  Filled 2020-05-19 (×2): qty 45

## 2020-05-19 MED ORDER — COLCHICINE 0.3 MG HALF TABLET
0.3000 mg | ORAL_TABLET | Freq: Two times a day (BID) | ORAL | Status: DC
  Administered 2020-05-19 – 2020-05-20 (×2): 0.3 mg
  Filled 2020-05-19 (×3): qty 1

## 2020-05-19 MED ORDER — ORAL CARE MOUTH RINSE
15.0000 mL | Freq: Two times a day (BID) | OROMUCOSAL | Status: DC
  Administered 2020-05-19 (×2): 15 mL via OROMUCOSAL

## 2020-05-19 MED ORDER — OXYCODONE HCL 5 MG PO TABS: 5.0000 mg | ORAL_TABLET | ORAL | Status: DC | PRN

## 2020-05-19 MED ORDER — SODIUM CHLORIDE 0.9 % IV SOLN: INTRAVENOUS | Status: DC | PRN

## 2020-05-19 MED ORDER — OSMOLITE 1.5 CAL PO LIQD
1000.0000 mL | ORAL | Status: DC
  Administered 2020-05-19: 1000 mL

## 2020-05-19 MED ORDER — LEVOTHYROXINE SODIUM 75 MCG PO TABS
75.0000 ug | ORAL_TABLET | Freq: Every day | ORAL | Status: DC
  Administered 2020-05-21: 75 ug
  Filled 2020-05-19: qty 1

## 2020-05-19 MED ORDER — POTASSIUM CHLORIDE 20 MEQ PO PACK
40.0000 meq | PACK | ORAL | Status: AC
  Administered 2020-05-19 (×2): 40 meq via ORAL
  Filled 2020-05-19: qty 2

## 2020-05-19 MED ORDER — HALOPERIDOL LACTATE 5 MG/ML IJ SOLN: 0.5000 mg | Freq: Four times a day (QID) | INTRAMUSCULAR | Status: DC | PRN

## 2020-05-19 MED ORDER — ROSUVASTATIN CALCIUM 5 MG PO TABS
10.0000 mg | ORAL_TABLET | Freq: Every day | ORAL | Status: DC
  Administered 2020-05-19 – 2020-05-20 (×2): 10 mg
  Filled 2020-05-19 (×2): qty 2

## 2020-05-19 MED ORDER — DOCUSATE SODIUM 50 MG/5ML PO LIQD
200.0000 mg | Freq: Every day | ORAL | Status: DC
  Administered 2020-05-19: 200 mg
  Filled 2020-05-19: qty 20

## 2020-05-19 MED ORDER — CHLORHEXIDINE GLUCONATE 0.12 % MT SOLN
15.0000 mL | Freq: Two times a day (BID) | OROMUCOSAL | Status: DC
  Administered 2020-05-19 – 2020-05-20 (×2): 15 mL via OROMUCOSAL
  Filled 2020-05-19: qty 15

## 2020-05-19 MED ORDER — ENOXAPARIN SODIUM 30 MG/0.3ML ~~LOC~~ SOLN
30.0000 mg | SUBCUTANEOUS | Status: DC
  Administered 2020-05-19 – 2020-05-22 (×4): 30 mg via SUBCUTANEOUS
  Filled 2020-05-19 (×4): qty 0.3

## 2020-05-19 MED ORDER — PANTOPRAZOLE SODIUM 40 MG PO PACK
40.0000 mg | PACK | Freq: Every day | ORAL | Status: DC
  Administered 2020-05-19: 40 mg
  Filled 2020-05-19: qty 20

## 2020-05-19 MED ORDER — POTASSIUM CHLORIDE 20 MEQ PO PACK
20.0000 meq | PACK | ORAL | Status: DC
Start: 2020-05-19 — End: 2020-05-19
  Administered 2020-05-19: 20 meq
  Filled 2020-05-19: qty 1

## 2020-05-19 NOTE — Progress Notes (Signed)
Peripherally Inserted Central Catheter Placement  The IV Nurse has discussed with the patient and/or persons authorized to consent for the patient, the purpose of this procedure and the potential benefits and risks involved with this procedure.  The benefits include less needle sticks, lab draws from the catheter, and the patient may be discharged home with the catheter. Risks include, but not limited to, infection, bleeding, blood clot (thrombus formation), and puncture of an artery; nerve damage and irregular heartbeat and possibility to perform a PICC exchange if needed/ordered by physician.  Alternatives to this procedure were also discussed.  Bard Power PICC patient education guide, fact sheet on infection prevention and patient information card has been provided to patient /or left at bedside.   Consent obtained via telephone with daughter  PICC Placement Documentation  PICC Double Lumen 62/19/47 PICC Right Basilic 37 cm 2 cm (Active)  Indication for Insertion or Continuance of Line Prolonged intravenous therapies 05/19/20 1000  Exposed Catheter (cm) 2 cm 05/19/20 1000  Site Assessment Clean;Dry;Intact 05/19/20 1000  Lumen #1 Status Flushed;Saline locked;Blood return noted 05/19/20 1000  Lumen #2 Status Flushed;Saline locked;Blood return noted 05/19/20 1000  Dressing Type Transparent;Securing device 05/19/20 1000  Dressing Status Clean;Dry;Intact 05/19/20 1000  Antimicrobial disc in place? Yes 05/19/20 1000  Safety Lock Not Applicable 12/52/71 2929  Line Care Connections checked and tightened 05/19/20 1000  Dressing Intervention New dressing 05/19/20 1000  Dressing Change Due 05/26/20 05/19/20 1000       Holley Bouche Renee 05/19/2020, 10:58 AM

## 2020-05-19 NOTE — Progress Notes (Signed)
RN aware to DC central line

## 2020-05-19 NOTE — Progress Notes (Signed)
SLP Cancellation Note  Patient Details Name: Tiffany White MRN: [id number] DOB: [birth year]   Cancelled treatment:       Reason Eval/Treat Not Completed: Medical issues which prohibited therapy. Per RN, MD wants pt to remain NPO today due to vomiting on previous date and new findings today that include crackles in her lungs. Will f/u for PO trials as medically appropriate.     Osie Bond., M.A. [facility] Pager [phone] Office [phone]  05/19/2020, 9:22 AM

## 2020-05-19 NOTE — Discharge Instructions (Signed)
[facility] office [phone]   Endoscopic Saphenous Vein Harvesting, Care After This sheet gives you information about how to care for yourself after your procedure. Your health care provider may also give you more specific instructions. If you have problems or questions, contact your health care provider. What can I expect after the procedure? After the procedure, it is common to have:  Pain.  Bruising.  Swelling.  Numbness. Follow these instructions at home: Incision care  Follow instructions from your health care provider about how to take care of your incisions. Make sure you: ? Wash your hands with soap and water before and after you change your bandages (dressings). If soap and water are not available, use hand sanitizer. ? Change your dressings as told by your health care provider. ? Leave stitches (sutures), skin glue, or adhesive strips in place. These skin closures may need to stay in place for 2 weeks or longer. If adhesive strip edges start to loosen and curl up, you may trim the loose edges. Do not remove adhesive strips completely unless your health care provider tells you to do that.  Check your incision areas every day for signs of infection. Check for: ? More redness, swelling, or pain. ? Fluid or blood. ? Warmth. ? Pus or a bad smell.   Medicines  Take over-the-counter and prescription medicines only as told by your health care provider.  Ask your health care provider if the medicine prescribed to you requires you to avoid driving or using heavy machinery. General instructions  Raise (elevate) your legs above the level of your heart while you are sitting or lying down.  Avoid crossing your legs.  Avoid sitting for long periods of time. Change positions every 30 minutes.  Do any exercises your health care providers have given you. These may include deep breathing, coughing, and walking exercises.  Do not take baths, swim, or use a hot tub until your health care  provider approves. Ask your health care provider if you may take showers. You may only be allowed to take sponge baths.  Wear compression stockings as told by your health care provider. These stockings help to prevent blood clots and reduce swelling in your legs.  Keep all follow-up visits as told by your health care provider. This is important. Contact a health care provider if:  Medicine does not help your pain.  Your pain gets worse.  You have new leg bruises or your leg bruises get bigger.  Your leg feels numb.  You have more redness, swelling, or pain around your incision.  You have fluid or blood coming from your incision.  Your incision feels warm to the touch.  You have pus or a bad smell coming from your incision.  You have a fever. Get help right away if:  Your pain is severe.  You develop pain, tenderness, warmth, redness, or swelling in any part of your leg.  You have chest pain.  You have trouble breathing. Summary  Raise (elevate) your legs above the level of your heart while you are sitting or lying down.  Wear compression stockings as told by your health care provider.  Make sure you know which symptoms should prompt you to contact your health care provider.  Keep all follow-up visits as told by your health care provider. This information is not intended to replace advice given to you by your health care provider. Make sure you discuss any questions you have with your health care provider. Document Revised: 12/26/2017  Document Reviewed: 12/26/2017 Elsevier Patient Education  2021 [employer]. Coronary Artery Bypass Grafting, Care After This sheet gives you information about how to care for yourself after your procedure. Your doctor may also give you more specific instructions. If you have problems or questions, call your doctor. What can I expect after the procedure? After the procedure, it is common to:  Feel sick to your stomach (nauseous).  Not  want to eat as much as normal (lack of appetite).  Have trouble pooping (constipation).  Have weakness and tiredness (fatigue).  Feel sad (depressed) or grouchy (irritable).  Have pain or discomfort around the cuts from surgery (incisions). Follow these instructions at home: Medicines  Take over-the-counter and prescription medicines only as told by your doctor. Do not stop taking medicines or start any new medicines unless your doctor says it is okay.  If you were prescribed an antibiotic medicine, take it as told by your doctor. Do not stop taking the antibiotic even if you start to feel better. Incision care  Follow instructions from your doctor about how to take care of your cuts from surgery. Make sure you: ? Wash your hands with soap and water before and after you change your bandage (dressing). If you cannot use soap and water, use hand sanitizer. ? Change your bandage as told by your doctor. ? Leave stitches (sutures), skin glue, or skin tape (adhesive) strips in place. They may need to stay in place for 2 weeks or longer. If tape strips get loose and curl up, you may trim the loose edges. Do not remove tape strips completely unless your doctor says it is okay.  Make sure the surgery cuts are clean, dry, and protected.  Check your cut areas every day for signs of infection. Check for: ? More redness, swelling, or pain. ? More fluid or blood. ? Warmth. ? Pus or a bad smell.  If cuts were made in your legs: ? Avoid crossing your legs. ? Avoid sitting for long periods of time. Change positions every 30 minutes. ? Raise (elevate) your legs when you are sitting.   Bathing  Do not take baths, swim, or use a hot tub until your doctor says it is okay.  You may shower. Pat the surgery cuts dry. Do not rub the cuts to dry.  Eating and drinking  Eat foods that are high in fiber, such as beans, nuts, whole grains, and raw fruits and vegetables. Any meats you eat should be lean  cut. Avoid canned, processed, and fried foods. This can help prevent trouble pooping. This is also a part of a heart-healthy diet.  Drink enough fluid to keep your pee (urine) pale yellow.  Do not drink alcohol until you are fully recovered. Ask your doctor when it is safe to drink alcohol.   Activity  Rest and limit your activity as told by your doctor. You may be told to: ? Stop any activity right away if you have chest pain, shortness of breath, irregular heartbeats, or dizziness. Get help right away if you have any of these symptoms. ? Move around often for short periods or take short walks as told by your doctor. Slowly increase your activities. ? Avoid lifting, pushing, or pulling anything that is heavier than 10 lb (4.5 kg) for at least 6 weeks or as told by your doctor.  Do physical therapy or a cardiac rehab (cardiac rehabilitation) program as told by your doctor. ? Physical therapy involves doing exercises to maintain movement  and build strength and endurance. ? A cardiac rehab program includes:  Exercise training.  Education.  Counseling.  Do not drive until your doctor says it is okay.  Ask your doctor when you can go back to work.  Ask your doctor when you can be sexually active. General instructions  Do not drive or use heavy machinery while taking prescription pain medicine.  Do not use any products that contain nicotine or tobacco. These include cigarettes, e-cigarettes, and chewing tobacco. If you need help quitting, ask your doctor.  Take 2-3 deep breaths every few hours during the day while you get better. This helps expand your lungs and prevent problems.  If you were given a device called an incentive spirometer, use it several times a day to practice deep breathing. Support your chest with a pillow or your arms when you take deep breaths or cough.  Wear compression stockings as told by your doctor.  Weigh yourself every day. This helps to see if your body  is holding (retaining) fluid that may make your heart and lungs work harder.  Keep all follow-up visits as told by your doctor. This is important. Contact a doctor if:  You have more redness, swelling, or pain around any cut.  You have more fluid or blood coming from any cut.  Any cut feels warm to the touch.  You have pus or a bad smell coming from any cut.  You have a fever.  You have swelling in your ankles or legs.  You have pain in your legs.  You gain 2 lb (0.9 kg) or more a day.  You feel sick to your stomach or you throw up (vomit).  You have watery poop (diarrhea). Get help right away if:  You have chest pain that goes to your jaw or arms.  You are short of breath.  You have a fast or irregular heartbeat.  You notice a "clicking" in your breastbone (sternum) when you move.  You have any signs of a stroke. "BE FAST" is an easy way to remember the main warning signs: ? B - Balance. Signs are dizziness, sudden trouble walking, or loss of balance. ? E - Eyes. Signs are trouble seeing or a change in how you see. ? F - Face. Signs are sudden weakness or loss of feeling of the face, or the face or eyelid drooping on one side. ? A - Arms. Signs are weakness or loss of feeling in an arm. This happens suddenly and usually on one side of the body. ? S - Speech. Signs are sudden trouble speaking, slurred speech, or trouble understanding what people say. ? T - Time. Time to call emergency services. Write down what time symptoms started.  You have other signs of a stroke, such as: ? A sudden, very bad headache with no known cause. ? Feeling sick to your stomach. ? Throwing up. ? Jerky movements you cannot control (seizure). These symptoms may be an emergency. Do not wait to see if the symptoms will go away. Get medical help right away. Call your local emergency services (911 in the [place] Do not drive yourself to the hospital. Summary  After the procedure, it is common to  have pain or discomfort in the cuts from surgery (incisions).  Do not take baths, swim, or use a hot tub until your doctor says it is okay.  Slowly increase your activities. You may need physical therapy or cardiac rehab.  Weigh yourself every day. This helps  to see if your body is holding fluid. This information is not intended to replace advice given to you by your health care provider. Make sure you discuss any questions you have with your health care provider. Document Revised: 09/27/2017 Document Reviewed: 09/27/2017 Elsevier Patient Education  2021 [employer].

## 2020-05-19 NOTE — Procedures (Signed)
Cortrak  Person Inserting Tube:  Tiffany White, RD Tube Type:  Cortrak - 43 inches Tube Location:  Left nare Initial Placement:  Stomach Secured by: Bridle Technique Used to Measure Tube Placement:  Documented cm marking at nare/ corner of mouth Cortrak Secured At:  64 cm   No x-ray is required. RN may begin using tube.   If the tube becomes dislodged please keep the tube and contact the Cortrak team at [email or web address] (password [email or web address]) for replacement.  If after hours and replacement cannot be delayed, place a NG tube and confirm placement with an abdominal x-ray.    Tiffany White RD, LDN Clinical Nutrition Pager listed in [place]

## 2020-05-19 NOTE — Progress Notes (Signed)
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, ON PUMP, USING LEFT INTERNAL MAMMARY ARTERY AND ENDOSCOPICALLY HARVESTED RIGHT GREATER SAPHENOUS VEIN (N/A) TRANSESOPHAGEAL ECHOCARDIOGRAM (TEE) (N/A) INDOCYANINE GREEN FLUORESCENCE IMAGING (ICG) (N/A) ENDOVEIN HARVEST OF GREATER SAPHENOUS VEIN (Right)  Total Length of Stay:  LOS: 5 days   Subjective: Feels ok but remains fairly lethargic/weak  Objective: Vital signs in last 24 hours: Temp:  [97.7 F (36.5 C)-99.14 F (37.3 C)] 99 F (37.2 C) (04/18 0736) Pulse Rate:  [58-84] 79 (04/18 0700) Cardiac Rhythm: Normal sinus rhythm (04/18 0400) Resp:  [11-36] 19 (04/18 0700) BP: (111-167)/(56-89) 142/65 (04/18 0700) SpO2:  [88 %-100 %] 95 % (04/18 0700) Arterial Line BP: (86-166)/(20-92) 155/74 (04/18 0500) Weight:  [75.5 kg] 75.5 kg (04/18 0500)  Filed Weights   05/16/20 0256 05/18/20 0500 05/19/20 0500  Weight: 67 kg 68.9 kg 75.5 kg    Weight change: 6.6 kg   Hemodynamic parameters for last 24 hours: CVP:  [1 mmHg-26 mmHg] 17 mmHg  Intake/Output from previous day: 04/17 0701 - 04/18 0700 In: 910.9 [I.V.:810.8; IV Piggyback:100.1] Out: 1226 [Urine:741; Chest Tube:485]  Intake/Output this shift: No intake/output data recorded.  Current Meds: Scheduled Meds: . sodium chloride   Intravenous Once  . acetaminophen  1,000 mg Oral Q6H   Or  . acetaminophen (TYLENOL) oral liquid 160 mg/5 mL  1,000 mg Per Tube Q6H  . aspirin EC  325 mg Oral Daily   Or  . aspirin  324 mg Per Tube Daily  . bisacodyl  10 mg Oral Daily   Or  . bisacodyl  10 mg Rectal Daily  . Chlorhexidine Gluconate Cloth  6 each Topical Daily  . colchicine  0.3 mg Oral BID  . docusate sodium  200 mg Oral Daily  . furosemide  40 mg Intravenous BID  . hydrALAZINE  5 mg Intravenous Q6H  .  levothyroxine  75 mcg Oral Q0600  . metoprolol tartrate  5 mg Intravenous Q6H  . pantoprazole  40 mg Oral Daily  . rosuvastatin  10 mg Oral q1800   Continuous Infusions: . lactated ringers    . nitroGLYCERIN Stopped (05/18/20 1804)   PRN Meds:.acetaminophen, dextrose, lactated ringers, levalbuterol, ondansetron (ZOFRAN) IV, oxyCODONE, sodium chloride flush, traMADol  General appearance: distracted and fatigued Neurologic: non focal grossly Heart: regular rate and rhythm Lungs: dim in lower fields Abdomen: soft, nontender Extremities: PAS in place, some edema Wound: dressings CDI  Lab Results: CBC: Recent Labs    05/18/20 1549 05/19/20 0338  WBC 12.0* 12.0*  HGB 10.0* 10.6*  HCT 30.4* 32.8*  PLT 166 171   BMET:  Recent Labs    05/18/20 1549 05/19/20 0338  NA 137 137  K 4.1 4.3  CL 108 107  CO2 23 20*  GLUCOSE 133* 165*  BUN 15 18  CREATININE 1.04* 1.15*  CALCIUM 7.3* 7.8*    CMET: Lab Results  Component Value Date   WBC 12.0 (H) 05/19/2020   HGB 10.6 (L) 05/19/2020   HCT 32.8 (L) 05/19/2020   PLT 171 05/19/2020   GLUCOSE 165 (H) 05/19/2020   CHOL 188 05/14/2020   TRIG 228 (H) 05/14/2020  HDL 36 (L) 05/14/2020   LDLCALC 106 (H) 05/14/2020   ALT 16 05/13/2020   AST 16 05/13/2020   NA 137 05/19/2020   K 4.3 05/19/2020   CL 107 05/19/2020   CREATININE 1.15 (H) 05/19/2020   BUN 18 05/19/2020   CO2 20 (L) 05/19/2020   INR 1.2 05/17/2020   HGBA1C 7.8 (H) 05/13/2020      PT/INR:  Recent Labs    05/17/20 1315  LABPROT 15.5*  INR 1.2   Radiology: No results found.   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, ON PUMP, USING LEFT INTERNAL MAMMARY ARTERY AND ENDOSCOPICALLY HARVESTED RIGHT GREATER SAPHENOUS VEIN (N/A) TRANSESOPHAGEAL ECHOCARDIOGRAM (TEE) (N/A) INDOCYANINE GREEN FLUORESCENCE IMAGING (ICG) (N/A) ENDOVEIN HARVEST OF GREATER SAPHENOUS VEIN (Right)  POD#2 1 afeb, VSS with some syst  HTN , sinus rhythm 2 sats ok on 4 liters 3 CXR some improvement in basilar aeration, ATX/infilt 4 normal renal fxn, fair UOP, weight up about 9 KG if accurate, will need further diuresis- current fuosemide 40 BID IV 5 CT 485 cc 6 minor leukocytosis- stable with WBC 12K 7 expected ABLA improved trend 8 BS adeq control for hospitalized patient- will need good outpatient management long term 9 core trak for feeding 10 PT/OT - may need CIR     John Giovanni PA-C Pager [phone] 05/19/2020 7:42 AM

## 2020-05-19 NOTE — Addendum Note (Signed)
Addendum  created 05/19/20 0555 by Josephine Igo, CRNA   Order list changed, Pharmacy for encounter modified

## 2020-05-19 NOTE — Progress Notes (Signed)
      [address]       ,[place] [address]             [phone]      POD # 2 CABG x 4  Up in chair Some confusion, no focal neuro deficit BP (!) 132/53   Pulse 80   Temp 97.8 F (36.6 C) (Oral)   Resp (!) 21   Ht 5' 2.5" (1.588 m)   Wt 75.5 kg   SpO2 96%   BMI 29.96 kg/m   Intake/Output Summary (Last 24 hours) at 05/19/2020 1824 Last data filed at 05/19/2020 1800 Gross per 24 hour  Intake 298.15 ml  Output 1990 ml  Net -1691.85 ml   K= 3.2, creatinine 1.16 Supplement K before giving next dose of Lasix  Remo Lipps C. Roxan Hockey, MD [facility] [phone]

## 2020-05-19 NOTE — Discharge Summary (Signed)
Physician Discharge Summary  Patient ID: Tiffany White MRN: [id number] DOB/AGE: 75-Jun-1947 75 y.o.  Admit date: 05/12/2020 Discharge date: 05/23/2020  Admission Diagnoses:CAD  Discharge Diagnoses:  Principal Problem:   Chest pain Active Problems:   Essential hypertension   Hypothyroidism   Vitamin D deficiency   Elevated troponin I level   Hyperglycemia due to diabetes mellitus (HCC)   Vitamin B12 deficiency   Coronary artery disease involving native coronary artery of native heart with unstable angina pectoris (HCC)   Angina pectoris (HCC)   S/P CABG x 4  Patient Active Problem List   Diagnosis Date Noted  . S/P CABG x 4 05/17/2020  . Angina pectoris ([place]) 05/14/2020  . Coronary artery disease involving native coronary artery of native heart with unstable angina pectoris ([place])   . Chest pain 05/13/2020  . Diabetes mellitus, type II ([place]) 05/13/2020  . Elevated troponin I level 05/13/2020  . Hyperglycemia due to diabetes mellitus ([place]) 05/13/2020  . Vitamin B12 deficiency 05/13/2020  . Chronic kidney disease, stage 3a ([place]) 08/27/2019  . Essential hypertension 08/27/2019  . 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[place]) 07/12/2016  . Hypothyroidism 04/28/2016  . Symptomatic menopausal or female climacteric states 03/21/2014  . Hypertriglyceridemia 10/03/2007  . Vitamin D deficiency 10/20/2006  . Other specified disorders of cartilage, unspecified sites 10/13/2006  . Herpes zoster 06/30/2001  . Benign neoplasm of colon 09/02/1998  . Acute pancreatitis 04/02/1991      History of Present Illness:    The patient is a 75 year old female we are asked to see in cardiothoracic surgical consultation for consideration of coronary artery surgical revascularization.  She has m cardiac risk factors including hyperlipidemia, hypertension and diabetes mellitus.  She presented to the emergency department at [facility]  On 05/13/2020 with a approximate history of 7 to 10 days  of intermittent chest pain which was worse on date of presentation.  Pain was described as midsternal with some radiation to the left side and felt like a pressure like/burning sensation.  She noted the symptoms were worse with exertion and was also noted to be associated with shortness of breath.  Initial  HStroponin I was 41.  EKG showed anterior and lateral T wave inversions with no previous tracing to compare..  Cardiology consultation was obtained due to her symptoms being consistent with accelerating angina.  The patient was started on a heparin drip as well as beta-blocker, ARB and aspirin.  She is noted to be statin intolerant per history but was started on low-dose Crestor.  The patient was transferred to [facility] for cardiac catheterization.  This was subsequently performed on 05/14/2020 and the full report is listed below.  She is found to have severe three-vessel coronary artery disease.  LVEDP was measured at 7 mmHg.  Echocardiogram shows left ventricular ejection fraction by estimation to be 65 to 70%.  There is no significant valvular disease.  Please see the full report below.  She has a remote history of smoking.  He smoked for approximately 20 years but quit 30 years ago.    Hospital course: Patient was medically stabilized and seen in cardiothoracic surgical consultation by Dr. Julien Girt who evaluated the patient and all studies.  He recommended proceeding with coronary bypass grafting as her best revascularization option due to the severity of the anatomical findings as well as her increased symptomatology.  On 05/17/2020 she was taken the operating room and underwent the following procedure:    Coronary Artery Bypass Grafting  x 4             Left Internal Mammary Artery to Distal Left Anterior Descending Coronary Artery; Saphenous Vein Graft to  Posterior Descending Coronary Artery and right PLA as sequenced graft; Saphenous Vein Graft to 1st Obtuse Marginal Branch of Left Circumflex  Coronary Artery; Endoscopic Vein Harvest from right Thigh and Lower Leg   She tolerated procedure well was taken to the surgical intensive care unit in stable condition.  Postoperative hospital course:  She was weaned from the ventilator using standard postcardiac surgical protocols.  She was noted to have some difficulty with swallowing early on as well as lethargy.  Speech therapy performed a bedside swallowing study.  Early postoperatively she has been made n.p.o. and a core track has been placed for feedings.  Her delirium followed closely and over time it has resolved.   Renal function is noted to be within normal limits and she is volume overloaded.  She is being treated with diuretics.  She does have a mild expected acute blood loss anemia which has been stable. The patient has subsequently made significant improvement in her overall mental status as well as swallowing.  She has undergone a modified barium swallow on May 31, 2020.  She has passed this and has been advanced to a regular consistency diet with thin liquids.  Her blood sugars have been somewhat less controlled as  dietary intake has improved and she is started back on her Glucophage at preoperative dose.  Sugars appear well controlled at this point but may ultimately require resumption of preop 70/30 insulin if her sugars become poorly controlled at the skilled nursing facility or home.  On 05/22/2020 she had a left-sided thoracentesis yielding 200 mL of fluid.  She has continued to make steady improvement in her overall rehabilitation.  She is unable to go home as she has nobody to assist her in the early discharge time period and arrangements are being made to spend a short time in a skilled nursing facility for ongoing rehabilitation.  Oxygen has been weaned off and she maintains good saturations on room air.  Incisions are noted to be healing well without evidence of infection.  She has an intolerance to statins but it was felt that she  should be tried on low-dose Crestor.  It may need to be stopped if she develops body aches, myalgias, etc.  She is tolerating gradually increasing activities using standard cardiac rehab protocols as well as being assisted by physical therapy.  At this time she appears to be quite stable for transfer to the SNF for ongoing recovery.      Discharged Condition: good  Consults: None  Significant Diagnostic Studies: routine post op labs and CXR's    LEFT HEART CATH AND CORONARY ANGIOGRAPHY    Conclusion    Ost LAD to Prox LAD lesion is 95% stenosed.  Ost Cx to Prox Cx lesion is 80% stenosed.  Ost RCA lesion is 70% stenosed.  Prox RCA lesion is 70% stenosed.  Prox RCA to Mid RCA lesion is 50% stenosed.  Mid RCA lesion is 75% stenosed.  Dist RCA lesion is 90% stenosed.  3rd RPL lesion is 95% stenosed.  Ost LM to Dist LM lesion is 10% stenosed.   There is significant coronary calcification with severe three-vessel coronary obstructive disease.  There is 95% ostial LAD stenosis, 70 to 80% ostial circumflex stenosis and diffuse stenoses in a calcified RCA with 70% ostial 70, 50 and 75% mid stenoses, distal  90% stenosis and 95% PLA stenosis.  LVEDP 7 mmHg.   ECHOCARDIOGRAM REPORT       Patient Name:  SULEIKA DONAVAN Date of Exam: 05/13/2020  Medical Rec #: [id number]   Height:    62.5 in  Accession #:  [id number]  Weight:    147.0 lb  Date of Birth: May 31, 1945   BSA:     1.687 m  Patient Age:  35 years   BP:      142/58 mmHg  Patient Gender: F       HR:      75 bpm.  Exam Location: [facility]   Procedure: 2D Echo, Cardiac Doppler and Color Doppler   Indications:  Chest Pain R07.9    History:    Patient has no prior history of Echocardiogram  examinations.         Risk Factors:Hypertension, Diabetes and Dyslipidemia.  Elevated         troponin I level, Chronic kidney disease, stage 3a ([place]).     Sonographer:  Alvino Chapel RCS  Referring Phys: [phone] COUR[facility] EMOKPAE   IMPRESSIONS    1. Left ventricular ejection fraction, by estimation, is 65 to 70%. The  left ventricle has normal function. The left ventricle has no regional  wall motion abnormalities. There is mild left ventricular hypertrophy.  Left ventricular diastolic parameters  are consistent with Grade I diastolic dysfunction (impaired relaxation).  Elevated left atrial pressure.  2. Right ventricular systolic function is normal. The right ventricular  size is normal.  3. The mitral valve is normal in structure. No evidence of mitral valve  regurgitation. No evidence of mitral stenosis.  4. The aortic valve is tricuspid. There is mild calcification of the  aortic valve. There is mild thickening of the aortic valve. Aortic valve  regurgitation is mild. No aortic stenosis is present.  5. The inferior vena cava is normal in size with greater than 50%  respiratory variability, suggesting right atrial pressure of 3 mmHg.  Treatments: surgery:  CARDIOTHORACIC SURGERY OPERATIVE NOTE  Date of Procedure:    05/17/2020  Preoperative Diagnosis:      Severe 3-vessel Coronary Artery Disease and unstable angina  Postoperative Diagnosis:    Same  Procedure:        Coronary Artery Bypass Grafting x 4             Left Internal Mammary Artery to Distal Left Anterior Descending Coronary Artery; Saphenous Vein Graft to  Posterior Descending Coronary Artery and right PLA as sequenced graft; Saphenous Vein Graft to 1st Obtuse Marginal Branch of Left Circumflex Coronary Artery; Endoscopic Vein Harvest from right Thigh and Lower Leg  Surgeon:        B.  Murvin Natal, MD  Assistant:       T.  Harriet Pho, PA-C  Anesthesia:    General  Operative Findings: ? Preserved left ventricular systolic function ? Good quality left internal mammary artery conduit ? Good quality saphenous vein conduit ? Good quality target vessels  for grafting  Discharge Exam: Blood pressure (!) 142/57, pulse 79, temperature 98.4 F (36.9 C), temperature source Oral, resp. rate 18, height 5' 2.5" (1.588 m), weight 71.2 kg, SpO2 96 %.   General appearance: alert, cooperative and no distress Heart: regular rate and rhythm Lungs: min dim in bases Abdomen: benign Extremities: no edema Wound: incis healing well Disposition: Discharge disposition: 03-Skilled Nursing Facility       Discharge Instructions    Amb Referral to Cardiac Rehabilitation  Complete by: As directed    Diagnosis: CABG   CABG X ___: 4   After initial evaluation and assessments completed: Virtual Based Care may be provided alone or in conjunction with Phase 2 Cardiac Rehab based on patient barriers.: Yes   Discharge patient   Complete by: As directed    Discharge disposition: 03-Skilled [facility]   Discharge patient date: 05/23/2020     Allergies as of 05/23/2020      Reactions   Statins Other (See Comments)   Severe Body aches       Medication List    STOP taking these medications   NovoLOG Mix 70/30 FlexPen (70-30) 100 UNIT/ML FlexPen Generic drug: insulin aspart protamine - aspart     TAKE these medications   aspirin EC 325 MG tablet Take 650 mg by mouth daily.   Cholecalciferol 25 MCG (1000 UT) capsule Take 1,000 Units by mouth daily.   cyanocobalamin 1000 MCG tablet Take 1,000 mcg by mouth daily.   feeding supplement Liqd Take 237 mLs by mouth 2 (two) times daily between meals.   Fish Oil 1000 MG Caps Take 1 capsule by mouth daily.   levothyroxine 75 MCG tablet Commonly known as: SYNTHROID Take 75 mcg by mouth daily before breakfast.   metFORMIN 1000 MG tablet Commonly known as: GLUCOPHAGE Take 1 tablet by mouth 2 (two) times daily.   metoprolol tartrate 25 MG tablet Commonly known as: LOPRESSOR Take 1 tablet (25 mg total) by mouth 2 (two) times daily.   multivitamin with minerals Tabs tablet Take 1 tablet by  mouth daily.   polyethylene glycol 17 g packet Commonly known as: MIRALAX / GLYCOLAX Take 17 g by mouth daily.   rosuvastatin 10 MG tablet Commonly known as: CRESTOR Take 1 tablet (10 mg total) by mouth daily at 6 PM.   traMADol 50 MG tablet Commonly known as: ULTRAM Take 1-2 tablets (50-100 mg total) by mouth every 12 (twelve) hours as needed for up to 7 days for moderate pain.       Follow-up Information    Wonda Olds, MD Follow up.   Specialty: Cardiothoracic Surgery Why: Please see discharge paperwork for follow-up appointment with your surgeon. Contact information: [address] [place] [address] [phone]              The patient has been discharged on:   1.Beta Blocker:  Yes [   y]                              No   [   ]                              If No, reason:  2.Ace Inhibitor/ARB: Yes [   ]                                     No  [n    ]                                     If No, reason:BP labile at times  3.Statin:   Yes [ y  ]  No  [   ]                  If No, reason:  4.Ecasa:  Yes  [ y  ]                  No   [   ]                  If No, reason:  Signed: John Giovanni PA-C 05/23/2020, 7:26 AM

## 2020-05-19 NOTE — Hospital Course (Addendum)
History of Present Illness:    The patient is a 75 year old female we are asked to see in cardiothoracic surgical consultation for consideration of coronary artery surgical revascularization.  She has m cardiac risk factors including hyperlipidemia, hypertension and diabetes mellitus.  She presented to the emergency department at [facility].  On 05/13/2020 with a approximate history of 7 to 10 days of intermittent chest pain which was worse on date of presentation.  Pain was described as midsternal with some radiation to the left side and felt like a pressure like/burning sensation.  She noted the symptoms were worse with exertion and was also noted to be associated with shortness of breath.  Initial  HStroponin I was 41.  EKG showed anterior and lateral T wave inversions with no previous tracing to compare..  Cardiology consultation was obtained due to her symptoms being consistent with accelerating angina.  The patient was started on a heparin drip as well as beta-blocker, ARB and aspirin.  She is noted to be statin intolerant per history but was started on low-dose Crestor.  The patient was transferred to [facility] for cardiac catheterization.  This was subsequently performed on 05/14/2020 and the full report is listed below.  She is found to have severe three-vessel coronary artery disease.  LVEDP was measured at 7 mmHg.  Echocardiogram shows left ventricular ejection fraction by estimation to be 65 to 70%.  There is no significant valvular disease.  Please see the full report below.  She has a remote history of smoking.  He smoked for approximately 20 years but quit 30 years ago.    Hospital course: Patient was medically stabilized and seen in cardiothoracic surgical consultation by Dr. Julien Girt who evaluated the patient and all studies.  He recommended proceeding with coronary bypass grafting as her best revascularization option due to the severity of the anatomical findings as well as her  increased symptomatology.  On 05/17/2020 she was taken the operating room and underwent the following procedure:   Coronary Artery Bypass Grafting x 4             Left Internal Mammary Artery to Distal Left Anterior Descending Coronary Artery; Saphenous Vein Graft to  Posterior Descending Coronary Artery and right PLA as sequenced graft; Saphenous Vein Graft to 1st Obtuse Marginal Branch of Left Circumflex Coronary Artery; Endoscopic Vein Harvest from right Thigh and Lower Leg   She tolerated procedure well was taken to the surgical intensive care unit in stable condition.  Postoperative hospital course:  She was weaned from the ventilator using standard postcardiac surgical protocols.  She was noted to have some difficulty with swallowing early on as well as lethargy.  Speech therapy performed a bedside swallowing study.  Early postoperatively she has been made n.p.o. and a core track has been placed for feedings.  Her delirium is being followed closely and could potentially require further evaluation if it does not resolve relatively quickly.  Renal function is noted to be within normal limits and she is volume overloaded.  She is being treated with diuretics.  She does have a mild expected acute blood loss anemia which has been stable in the postoperative period blood sugars have been under adequate control using standard protocols.  The patient has subsequently made significant improvement in her overall mental status as well as swallowing.  She has undergone a modified barium swallow on May 29, 2020.  She has passed this and has been advanced to a regular consistency diet with thin  liquids.  Her blood sugars have been somewhat less controlled as reviewed dietary intake has improved and she is started back on her Glucophage at preoperative dose.  On 05/22/2020 she had a left-sided thoracentesis yielding 200 mL of fluid.  She has continued to make steady improvement in her overall rehabilitation.  She is  unable to go home as she has nobody to assist her in the early discharge time interval arrangements are being made to spend a short time in a skilled nursing facility for ongoing rehabilitation.

## 2020-05-19 NOTE — Progress Notes (Signed)
Initial Nutrition Assessment  RD working remotely.  DOCUMENTATION CODES:   Not applicable  INTERVENTION:   - Recommend adjusting bowel regimen as last documented BM was on 4/11  Initiate tube feeds via Cortrak: - Start Osmolite 1.5 @ 20 ml/hr and advance by 10 ml q 8 hours to goal rate of 50 ml/hr (1200 ml/day) - ProSource TF 45 ml daily  Tube feeding regimen at goal provides 1840 kcal, 86 grams of protein, and 914 ml of H2O.   NUTRITION DIAGNOSIS:   Inadequate oral intake related to lethargy/confusion as evidenced by NPO status.  GOAL:   Patient will meet greater than or equal to 90% of their needs  MONITOR:   Diet advancement,Labs,Weight trends,TF tolerance,Skin,I & O's  REASON FOR ASSESSMENT:   Consult Enteral/tube feeding initiation and management  ASSESSMENT:   75 year old female who presented to the ED on 4/11 with chest pain. PMH of HTN, T2DM, hypothyroidism, HLD, vitamin D deficiency, vitamin B-12 deficiency.   4/13 - left heart cath revealing severe CAD 4/16 - s/p CABG x 4 4/18 - Cortrak placed, tip gastric  Consult received for tube feeding initiation and management. Cortrak placed this morning, tip gastric per Cortrak team.  Pt previously on Heart Healthy diet with 85-100% meal completions documented. Last meal documented was 100% of dinner on 4/15.  Per notes, pt with gagging/vomiting yesterday after SLP evaluation. Pt also lethargic/weak per notes. Pt is NPO at this time. Will start tube feeds at trickle rate and slowly advance. Discussed plan with RN.  Last documented BM was on 4/11. Recommend adjusting bowel regimen.  Admit weight: 66.5 kg Current weight: 75.5 kg  Pt with +1 pitting generalized edema per nursing documentation.  Meal Completion: 85-100% prior to NPO  Medications reviewed and include: dulcolax, colace, IV lasix, SSI q 4 hours, protonix  Labs reviewed: creatinine 1.15, ionized calcium 1.10 CBG's: 117-169 x 24 hours  UOP: 741  ml x 24 hours CT: 485 ml x 24 hours I/O's: -1.4 L since admit  NUTRITION - FOCUSED PHYSICAL EXAM:  Unable to complete at this time. RD working remotely.  Diet Order:   Diet Order            Diet NPO time specified Except for: Other (See Comments)  Diet effective now                 EDUCATION NEEDS:   No education needs have been identified at this time  Skin:  Skin Assessment: Skin Integrity Issues: Incisions: right leg, chest  Last BM:  05/12/20  Height:   Ht Readings from Last 1 Encounters:  05/12/20 5' 2.5" (1.588 m)    Weight:   Wt Readings from Last 1 Encounters:  05/19/20 75.5 kg    BMI:  Body mass index is 29.96 kg/m.  Estimated Nutritional Needs:   Kcal:  1700-1900  Protein:  85-100 grams  Fluid:  1.7-1.9 L    Tiffany Bryant, MS, RD, LDN Inpatient Clinical Dietitian Please see AMiON for contact information.

## 2020-05-20 ENCOUNTER — Inpatient Hospital Stay (HOSPITAL_COMMUNITY): Payer: Medicare HMO

## 2020-05-20 LAB — BASIC METABOLIC PANEL
Anion gap: 8 (ref 5–15)
BUN: 24 mg/dL — ABNORMAL HIGH (ref 8–23)
CO2: 27 mmol/L (ref 22–32)
Calcium: 8.2 mg/dL — ABNORMAL LOW (ref 8.9–10.3)
Chloride: 107 mmol/L (ref 98–111)
Creatinine, Ser: 1.08 mg/dL — ABNORMAL HIGH (ref 0.44–1.00)
GFR, Estimated: 54 mL/min — ABNORMAL LOW (ref >60)
Glucose, Bld: 139 mg/dL — ABNORMAL HIGH (ref 70–99)
Potassium: 4.3 mmol/L (ref 3.5–5.1)
Sodium: 142 mmol/L (ref 135–145)

## 2020-05-20 LAB — GLUCOSE, CAPILLARY
Glucose-Capillary: 117 mg/dL — ABNORMAL HIGH (ref 70–99)
Glucose-Capillary: 131 mg/dL — ABNORMAL HIGH (ref 70–99)
Glucose-Capillary: 133 mg/dL — ABNORMAL HIGH (ref 70–99)
Glucose-Capillary: 138 mg/dL — ABNORMAL HIGH (ref 70–99)
Glucose-Capillary: 153 mg/dL — ABNORMAL HIGH (ref 70–99)
Glucose-Capillary: 173 mg/dL — ABNORMAL HIGH (ref 70–99)

## 2020-05-20 LAB — PHOSPHORUS
Phosphorus: 1.8 mg/dL — ABNORMAL LOW (ref 2.5–4.6)
Phosphorus: 2.1 mg/dL — ABNORMAL LOW (ref 2.5–4.6)

## 2020-05-20 LAB — MAGNESIUM
Magnesium: 2.3 mg/dL (ref 1.7–2.4)
Magnesium: 2.3 mg/dL (ref 1.7–2.4)

## 2020-05-20 MED ORDER — POLYETHYLENE GLYCOL 3350 17 G PO PACK
17.0000 g | PACK | Freq: Every day | ORAL | Status: DC
  Administered 2020-05-20 – 2020-05-22 (×3): 17 g via ORAL
  Filled 2020-05-20 (×4): qty 1

## 2020-05-20 MED ORDER — ORAL CARE MOUTH RINSE
15.0000 mL | Freq: Two times a day (BID) | OROMUCOSAL | Status: DC
  Administered 2020-05-20 – 2020-05-23 (×7): 15 mL via OROMUCOSAL

## 2020-05-20 MED ORDER — FUROSEMIDE 10 MG/ML IJ SOLN
40.0000 mg | Freq: Every day | INTRAMUSCULAR | Status: DC
  Administered 2020-05-21 – 2020-05-22 (×2): 40 mg via INTRAVENOUS
  Filled 2020-05-20 (×2): qty 4

## 2020-05-20 MED ORDER — K PHOS MONO-SOD PHOS DI & MONO 155-852-130 MG PO TABS
250.0000 mg | ORAL_TABLET | Freq: Three times a day (TID) | ORAL | Status: AC
  Administered 2020-05-20 (×3): 250 mg
  Filled 2020-05-20 (×5): qty 1

## 2020-05-20 MED ORDER — PLASMA-LYTE A IV SOLN
INTRAVENOUS | Status: DC
  Filled 2020-05-20: qty 1000

## 2020-05-20 NOTE — Evaluation (Signed)
Physical Therapy Evaluation Patient Details Name: Tiffany White MRN: [id number] DOB: [birth year] Today's Date: 05/20/2020   History of Present Illness  Pt is a 75 year old woman admitted with chest pain x 1 week on 05/14/20. 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 Hospital course complicated by delirium.  PMH: HTN, DM2, hypothyroidism.    Clinical Impression  Pt admitted with above diagnosis. PTA pt lived alone, active and independent. On eval, she required +2 min assist bed mobility, +2 min assist transfers, and +2 mod assist ambulation 150' with eva walker. HR and SpO2 stable on 4L. Max RR 45 during ambulation, requiring multiple standing rest breaks to recover RR to 20s. Pt currently with functional limitations due to the deficits listed below (see PT Problem List). Pt will benefit from skilled PT to increase their independence and safety with mobility to allow discharge to the venue listed below. Pt reports having no family available to provide assist at d/c. If family is available to stay with pt and provide 24-hour assist, may be able to consider HHPT as mobility improves.     Follow Up Recommendations SNF;Supervision/Assistance - 24 hour    Equipment Recommendations  Rolling walker with 5" wheels    Recommendations for Other Services       Precautions / Restrictions Precautions Precautions: Fall;Sternal;Other (comment) Precaution Booklet Issued: No Precaution Comments: verbally educated in sternal precautions, watch RR      Mobility  Bed Mobility Overal bed mobility: Needs Assistance Bed Mobility: Sit to Supine       Sit to supine: +2 for physical assistance;Min assist   General bed mobility comments: assist with trunk and BLE back into bed, cues for sequencing    Transfers Overall transfer level: Needs assistance Equipment used: Bilateral platform walker (eva walker) Transfers: Sit to/from Stand Sit to Stand: +2 physical assistance;Min assist;+2 safety/equipment          General transfer comment: cues for hand placement, assist to power up  Ambulation/Gait Ambulation/Gait assistance: Mod assist;+2 physical assistance;+2 safety/equipment Gait Distance (Feet): 150 Feet Assistive device: Bilateral platform walker (eva walker) Gait Pattern/deviations: Step-through pattern;Decreased stride length Gait velocity: decreased Gait velocity interpretation: <1.31 ft/sec, indicative of household ambulator General Gait Details: heavy pull to L requiring assist to manage eva walker and maintain balance, max RR 45. Multiple standing rest breaks for 2/4 DOE. Mobilized on 4L.  Stairs            Wheelchair Mobility    Modified Rankin (Stroke Patients Only)       Balance Overall balance assessment: Needs assistance Sitting-balance support: No upper extremity supported;Feet supported Sitting balance-Leahy Scale: Fair     Standing balance support: Bilateral upper extremity supported;During functional activity Standing balance-Leahy Scale: Poor Standing balance comment: reliant on external support                             Pertinent Vitals/Pain Pain Assessment: Faces Faces Pain Scale: Hurts little more Pain Location: chest incision Pain Descriptors / Indicators: Discomfort;Grimacing;Guarding Pain Intervention(s): Monitored during session;Repositioned    Home Living Family/patient expects to be discharged to:: Private residence Living Arrangements: Alone Available Help at Discharge: Family;Available PRN/intermittently Type of Home: House Home Access: Other (comment) (reports a ramp is being built)     Home Layout: One level Home Equipment: None      Prior Function Level of Independence: Independent         Comments: drives, likes to can vegetables  Hand Dominance   Dominant Hand: Right    Extremity/Trunk Assessment   Upper Extremity Assessment Upper Extremity Assessment: Generalized weakness (sternal precautions)     Lower Extremity Assessment Lower Extremity Assessment: Generalized weakness    Cervical / Trunk Assessment Cervical / Trunk Assessment: Normal  Communication   Communication: No difficulties  Cognition Arousal/Alertness: Awake/alert Behavior During Therapy: WFL for tasks assessed/performed Overall Cognitive Status: Impaired/Different from baseline Area of Impairment: Safety/judgement;Following commands                       Following Commands: Follows one step commands with increased time Safety/Judgement: Decreased awareness of safety;Decreased awareness of deficits     General Comments: pt had pulled Cortrak out, unaware of her imbalance, multimodal cues needed to follow sternal precautions      General Comments General comments (skin integrity, edema, etc.): Pt on 4L continuous O2. HR and SpO2 stable during mobility. Resting RR in 20s. RR in 30s and 40s during mobility, max 45.    Exercises     Assessment/Plan    PT Assessment Patient needs continued PT services  PT Problem List Decreased strength;Decreased mobility;Decreased safety awareness;Decreased knowledge of precautions;Decreased activity tolerance;Cardiopulmonary status limiting activity;Pain;Decreased balance;Decreased knowledge of use of DME       PT Treatment Interventions DME instruction;Therapeutic activities;Gait training;Therapeutic exercise;Patient/family education;Balance training;Functional mobility training    PT Goals (Current goals can be found in the Care Plan section)  Acute Rehab PT Goals Patient Stated Goal: to go home PT Goal Formulation: With patient Time For Goal Achievement: 06/03/20 Potential to Achieve Goals: Good    Frequency Min 3X/week   Barriers to discharge Decreased caregiver support      Co-evaluation   Reason for Co-Treatment: For patient/therapist safety;Complexity of the patient's impairments (multi-system involvement)   OT goals addressed during session:  ADL's and self-care       AM-PAC PT "6 Clicks" Mobility  Outcome Measure Help needed turning from your back to your side while in a flat bed without using bedrails?: A Little Help needed moving from lying on your back to sitting on the side of a flat bed without using bedrails?: A Little Help needed moving to and from a bed to a chair (including a wheelchair)?: A Little Help needed standing up from a chair using your arms (e.g., wheelchair or bedside chair)?: A Little Help needed to walk in hospital room?: A Lot Help needed climbing 3-5 steps with a railing? : A Lot 6 Click Score: 16    End of Session Equipment Utilized During Treatment: Gait belt;Oxygen Activity Tolerance: Patient tolerated treatment well Patient left: in bed;with call bell/phone within reach;with bed alarm set Nurse Communication: Mobility status PT Visit Diagnosis: Unsteadiness on feet (R26.81);Muscle weakness (generalized) (M62.81);Difficulty in walking, not elsewhere classified (R26.2);Pain    Time: 0347-4259 PT Time Calculation (min) (ACUTE ONLY): 21 min   Charges:   PT Evaluation $PT Eval Moderate Complexity: 1 Mod          Lorrin Goodell, PT  Office # [phone] Pager [phone]   Tiffany White 05/20/2020, 1:03 PM

## 2020-05-20 NOTE — Progress Notes (Signed)
CARDIAC REHAB PHASE I   Went to offer to walk with pt. Pt asleep in bed, arousable to loud stimuli, but went right back to sleep. RN aware. Will continue to follow and encourage ambulation as able.  Rufina Falco, RN BSN 05/20/2020 2:26 PM

## 2020-05-20 NOTE — Evaluation (Signed)
Occupational Therapy Evaluation Patient Details Name: Tiffany White MRN: [id number] DOB: 1945/11/03 Today's Date: 05/20/2020    History of Present Illness Pt is a 75 year old woman admitted with chest pain x 1 week on 05/14/20. 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 Hospital course complicated by delirium.  PMH: HTN, DM2, hypothyroidism.   Clinical Impression   Pt was independent and living alone prior to admission. She reports she does not have anyone to assist her when she returns home, but states a ramp is supposed to be built. Pt presents with post operative pain in her chest, decreased activity tolerance with RR to 45 with ambulation, Sp02 >90% on 4L., poor standing balance and impaired cognition. She ambulated with eva walker in unit with moderate assistance for balance and to keep from leaning L. She needs set up to total assist for ADL. Pt would be an excellent candidate for CIR if she is able to secure a caregiver upon discharge. Will follow acutely.    Follow Up Recommendations  SNF;Supervision/Assistance - 24 hour    Equipment Recommendations  3 in 1 bedside commode    Recommendations for Other Services       Precautions / Restrictions Precautions Precautions: Fall;Sternal Precaution Booklet Issued: No Precaution Comments: verbally educated in sternal precautions, watch RR Restrictions Weight Bearing Restrictions: Yes (sternal precautions)      Mobility Bed Mobility Overal bed mobility: Needs Assistance Bed Mobility: Sit to Supine       Sit to supine: +2 for physical assistance;Min assist   General bed mobility comments: assist to guide trunk and UEs back into bed    Transfers Overall transfer level: Needs assistance Equipment used: Bilateral platform walker (Eva walker) Transfers: Sit to/from Stand Sit to Stand: +2 physical assistance;Min assist;+2 safety/equipment         General transfer comment: multimodal cues for hands on knees, min assist to stand and steady     Balance Overall balance assessment: Needs assistance   Sitting balance-Leahy Scale: Fair     Standing balance support: Bilateral upper extremity supported Standing balance-Leahy Scale: Poor                             ADL either performed or assessed with clinical judgement   ADL Overall ADL's : Needs assistance/impaired Eating/Feeding: Set up;Sitting   Grooming: Set up;Sitting   Upper Body Bathing: Moderate assistance;Sitting   Lower Body Bathing: Total assistance;Sit to/from stand   Upper Body Dressing : Minimal assistance;Sitting   Lower Body Dressing: Total assistance;Sit to/from stand               Functional mobility during ADLs: +2 for physical assistance;Moderate assistance;+2 for safety/equipment (eva walker) General ADL Comments: Pt with tendency to lean toward L without awareness with ambulation. RR to 45 while walking, requiring standing rest breaks.     Vision Baseline Vision/History: Wears glasses Patient Visual Report: No change from baseline       Perception     Praxis      Pertinent Vitals/Pain Pain Assessment: Faces Faces Pain Scale: Hurts little more Pain Location: chest incision Pain Descriptors / Indicators: Discomfort;Grimacing;Guarding Pain Intervention(s): Monitored during session;Repositioned     Hand Dominance Right   Extremity/Trunk Assessment Upper Extremity Assessment Upper Extremity Assessment: Generalized weakness   Lower Extremity Assessment Lower Extremity Assessment: Defer to PT evaluation   Cervical / Trunk Assessment Cervical / Trunk Assessment: Normal   Communication Communication Communication: No difficulties  Cognition Arousal/Alertness: Awake/alert Behavior During Therapy: WFL for tasks assessed/performed Overall Cognitive Status: Impaired/Different from baseline Area of Impairment: Safety/judgement;Following commands                       Following Commands: Follows one step  commands with increased time Safety/Judgement: Decreased awareness of safety;Decreased awareness of deficits     General Comments: pt had pulled Cortrak out, unaware of her imbalance, multimodal cues needed to follow sternal precautions   General Comments       Exercises     Shoulder Instructions      Home Living Family/patient expects to be discharged to:: Private residence Living Arrangements: Alone   Type of Home: House Home Access: Other (comment) (reports a ramp is being built)     Home Layout: One level               Home Equipment: None          Prior Functioning/Environment Level of Independence: Independent        Comments: drives, likes to can vegetables        OT Problem List: Decreased strength;Decreased activity tolerance;Impaired balance (sitting and/or standing);Decreased knowledge of use of DME or AE;Decreased cognition;Decreased safety awareness;Cardiopulmonary status limiting activity;Pain      OT Treatment/Interventions: Self-care/ADL training;Energy conservation;DME and/or AE instruction;Therapeutic activities;Patient/family education;Balance training;Cognitive remediation/compensation    OT Goals(Current goals can be found in the care plan section) Acute Rehab OT Goals Patient Stated Goal: to go home OT Goal Formulation: With patient Time For Goal Achievement: 06/03/20 Potential to Achieve Goals: Good ADL Goals Pt Will Perform Grooming: with supervision;standing Pt Will Perform Upper Body Dressing: with set-up;sitting Pt Will Perform Lower Body Dressing: with supervision;sit to/from stand Pt Will Transfer to Toilet: with supervision;ambulating;bedside commode (over toilet) Pt Will Perform Toileting - Clothing Manipulation and hygiene: with supervision;sit to/from stand Additional ADL Goal #1: Pt will adhere to sternal precautions in ADL and mobility with min verbal cues. Additional ADL Goal #2: Pt will generalize energy conservation  strategies in ADL and mobility.  OT Frequency: Min 2X/week   Barriers to D/C: Decreased caregiver support          Co-evaluation PT/OT/SLP Co-Evaluation/Treatment: Yes Reason for Co-Treatment: For patient/therapist safety;Complexity of the patient's impairments (multi-system involvement)   OT goals addressed during session: ADL's and self-care      AM-PAC OT "6 Clicks" Daily Activity     Outcome Measure Help from another person eating meals?: A Little Help from another person taking care of personal grooming?: A Little Help from another person toileting, which includes using toliet, bedpan, or urinal?: A Lot Help from another person bathing (including washing, rinsing, drying)?: A Lot Help from another person to put on and taking off regular upper body clothing?: A Little Help from another person to put on and taking off regular lower body clothing?: Total 6 Click Score: 14   End of Session Equipment Utilized During Treatment: Gait belt;Oxygen (4L) Nurse Communication: Mobility status  Activity Tolerance: Patient tolerated treatment well Patient left: in bed;with call bell/phone within reach  OT Visit Diagnosis: Unsteadiness on feet (R26.81);Other abnormalities of gait and mobility (R26.89);Pain;Muscle weakness (generalized) (M62.81);Other symptoms and signs involving cognitive function                Time: 0814-4818 OT Time Calculation (min): 22 min Charges:  OT General Charges $OT Visit: 1 Visit OT Evaluation $OT Eval Moderate Complexity: 1 Mod  Nestor Lewandowsky, OTR/L Acute Rehabilitation  Services Pager: [phone] Office: [phone]  Malka So 05/20/2020, 11:02 AM

## 2020-05-20 NOTE — Progress Notes (Addendum)
[address]       [place] [address]             [phone]      3 Days Post-Op Procedure(s) (LRB): CORONARY ARTERY BYPASS GRAFTING (CABG) TIMES FOUR, ON PUMP, USING LEFT INTERNAL MAMMARY ARTERY AND ENDOSCOPICALLY HARVESTED RIGHT GREATER SAPHENOUS VEIN (N/A) TRANSESOPHAGEAL ECHOCARDIOGRAM (TEE) (N/A) INDOCYANINE GREEN FLUORESCENCE IMAGING (ICG) (N/A) ENDOVEIN HARVEST OF GREATER SAPHENOUS VEIN (Right) Subjective: Pulled out core trak Much mor alert and talkative today Feels thirsty  Objective: Vital signs in last 24 hours: Temp:  [97.5 F (36.4 C)-99.14 F (37.3 C)] 97.9 F (36.6 C) (04/19 0710) Pulse Rate:  [63-88] 76 (04/19 0700) Cardiac Rhythm: Normal sinus rhythm (04/19 0000) Resp:  [15-35] 18 (04/19 0700) BP: (106-164)/(50-72) 126/56 (04/19 0700) SpO2:  [93 %-99 %] 99 % (04/19 0700) Weight:  [72 kg] 72 kg (04/19 0500)  Hemodynamic parameters for last 24 hours: CVP:  [12 mmHg] 12 mmHg  Intake/Output from previous day: 04/18 0701 - 04/19 0700 In: 438.1 [I.V.:206.5; NG/GT:131.7; IV Piggyback:100] Out: 2135 [Urine:2085; Chest Tube:50] Intake/Output this shift: No intake/output data recorded.  General appearance: alert, cooperative and no distress Heart: regular rate and rhythm Lungs: dim in left base Abdomen: benign Extremities: min edema Wound: incis healing well  Lab Results: Recent Labs    05/18/20 1549 05/19/20 0338  WBC 12.0* 12.0*  HGB 10.0* 10.6*  HCT 30.4* 32.8*  PLT 166 171   BMET:  Recent Labs    05/19/20 0338 05/19/20 1331  NA 137 139  K 4.3 3.2*  CL 107 103  CO2 20* 27  GLUCOSE 165* 233*  BUN 18 18  CREATININE 1.15* 1.16*  CALCIUM 7.8* 7.9*    PT/INR:  Recent Labs    05/17/20 1315  LABPROT 15.5*  INR 1.2   ABG    Component Value Date/Time   PHART 7.360 05/18/2020 0844   HCO3 21.1 05/18/2020 0844   TCO2 22 05/18/2020 0844   ACIDBASEDEF 4.0 (H) 05/18/2020 0844   O2SAT 95.0 05/18/2020 0844   CBG  (last 3)  Recent Labs    05/20/20 0026 05/20/20 0457 05/20/20 0711  GLUCAP 131* 133* 153*    Meds Scheduled Meds: . sodium chloride   Intravenous Once  . acetaminophen  1,000 mg Oral Q6H   Or  . acetaminophen (TYLENOL) oral liquid 160 mg/5 mL  1,000 mg Per Tube Q6H  . aspirin EC  325 mg Oral Daily   Or  . aspirin  324 mg Per Tube Daily  . bisacodyl  10 mg Oral Daily   Or  . bisacodyl  10 mg Rectal Daily  . chlorhexidine  15 mL Mouth Rinse BID  . Chlorhexidine Gluconate Cloth  6 each Topical Daily  . colchicine  0.3 mg Per Tube BID  . docusate  200 mg Per Tube Daily  . enoxaparin (LOVENOX) injection  30 mg Subcutaneous Q24H  . feeding supplement (PROSource TF)  45 mL Per Tube Daily  . furosemide  40 mg Intravenous BID  . hydrALAZINE  5 mg Intravenous Q6H  . insulin aspart  0-24 Units Subcutaneous Q4H  . levothyroxine  75 mcg Per Tube Q0600  . mouth rinse  15 mL Mouth Rinse q12n4p  . metoprolol tartrate  5 mg Intravenous Q6H  . pantoprazole sodium  40 mg Per Tube Daily  . phosphorus  250 mg Per Tube TID  . rosuvastatin  10 mg Per Tube q1800  .  sodium chloride flush  10-40 mL Intracatheter Q12H   Continuous Infusions: . sodium chloride 10 mL/hr at 05/20/20 0400  . feeding supplement (OSMOLITE 1.5 CAL) Stopped (05/19/20 1630)  . lactated ringers    . nitroGLYCERIN Stopped (05/18/20 1804)   PRN Meds:.sodium chloride, acetaminophen, dextrose, haloperidol lactate, lactated ringers, levalbuterol, ondansetron (ZOFRAN) IV, oxyCODONE, sodium chloride flush, sodium chloride flush, traMADol  Xrays DG Chest Port 1 View  Result Date: 05/20/2020 CLINICAL DATA:  Sore chest. EXAM: PORTABLE CHEST 1 VIEW COMPARISON:  05/19/2020. FINDINGS: Interim removal of right IJ sheath. PICC line noted with tip over cavoatrial junction. Prior CABG. Cardiomegaly. Low lung volumes with persistent bibasilar and infiltrates/edema. Small left pleural effusion. No pneumothorax. IMPRESSION: 1. Interim  removal of right IJ sheath. PICC line noted with tip over cavoatrial junction. 2.  Prior CABG.  Stable cardiomegaly. 3. Low lung volumes with bibasilar atelectasis and infiltrates/edema. Small left pleural effusion. A component of CHF cannot be excluded. Electronically Signed   By: Marcello Moores  Register   On: 05/20/2020 06:40   DG Chest Port 1 View  Result Date: 05/19/2020 CLINICAL DATA:  Shortness of breath, chest pain. EXAM: PORTABLE CHEST 1 VIEW COMPARISON:  May 18, 2020. FINDINGS: Stable cardiomediastinal silhouette. No pneumothorax is noted. Bilateral chest tubes are noted without pneumothorax. Bibasilar subsegmental atelectasis is noted. Bony thorax is unremarkable. IMPRESSION: Bilateral chest tubes without pneumothorax. Bibasilar subsegmental atelectasis. Electronically Signed   By: Marijo Conception M.D.   On: 05/19/2020 08:18   [place] EKG SITE RITE  Result Date: 05/19/2020 If Site Rite image not attached, placement could not be confirmed due to current cardiac rhythm.   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, ON PUMP, USING LEFT INTERNAL MAMMARY ARTERY AND ENDOSCOPICALLY HARVESTED RIGHT GREATER SAPHENOUS VEIN (N/A) TRANSESOPHAGEAL ECHOCARDIOGRAM (TEE) (N/A) INDOCYANINE GREEN FLUORESCENCE IMAGING (ICG) (N/A) ENDOVEIN HARVEST OF GREATER SAPHENOUS VEIN (Right)  1 afeb, VSS SBP 100's- 160's- mostly well controlled, sinus rhythm. Delerium is signif improved , exam remains non-focal grossly 2 sats good on 4 liters 3 BMET stable renal fxn, K+ 4.3 4 PHOS low- replace 5 CXR-Low lung volumes with bibasilar atelectasis and infiltrates/edema. Small left pleural effusion. A component of CHF cannot be excluded 6 BS fairly well controlled 7 need ST to re-evaluate as may be able to advance diet , but may need core-trak replaced 8 I/O- diuresing well- will reduce lasix to q day for now- thirsty, feels dehydrated      LOS: 6 days    John Giovanni  PA-C Pager [phone] 05/20/2020 Pt seen and examined; agree with documentation Transfer to [place] Cor-trak SLP eval Payton Moder Z. Orvan Seen, [place]

## 2020-05-20 NOTE — Progress Notes (Signed)
  Speech Language Pathology Treatment: Dysphagia  Patient Details Name: Tiffany White MRN: [id number] DOB: [birth year] Today's Date: 05/20/2020 Time: 8887-5797 SLP Time Calculation (min) (ACUTE ONLY): 17 min  Assessment / Plan / Recommendation Clinical Impression  Pt was alert and sitting up in her chair requesting water. No overt immediate coughing occurred during PO trials despite challenging; however, throat clearing was observed consistently across all trials of all consistencies. Question if throat clearing is potentially an esophageal symptom since she previously was reported to be nauseous and also has been having regurgitation this admission. Pt reports that her voice is back to normal but denies any esophageal symptoms PTA. Given previous nausea and regurgitation, nursing recommends starting no more than a clear liquid diet. Will start thin liquids in a clear liquid diet with close supervision during intake. SLP will continue to follow for potential diet upgrade or need for instrumental test.     HPI HPI: Tiffany White is a 75 y.o. female with medical history significant for hypertension, T2DM, hypothyroidism who presents to the emergency department due to 7-10-day onset of intermittent chest pain.  Pt underwent CABG on 4/16 and was intubated for procedure.      SLP Plan  Continue with current plan of care       Recommendations  Diet recommendations: Thin liquid;Other(comment) (Clear liquid diet) Liquids provided via: Cup;Straw Medication Administration: Crushed with puree Supervision: Patient able to self feed;Full supervision/cueing for compensatory strategies Compensations: Minimize environmental distractions;Slow rate;Small sips/bites Postural Changes and/or Swallow Maneuvers: Seated upright 90 degrees;Upright 30-60 min after meal                Oral Care Recommendations: Oral care QID Follow up Recommendations: Other (comment) (tba) SLP Visit Diagnosis: Dysphagia,  unspecified (R13.10) Plan: Continue with current plan of care       GO                Jeanine Luz., SLP Student 05/20/2020, 10:12 AM

## 2020-05-21 ENCOUNTER — Inpatient Hospital Stay (HOSPITAL_COMMUNITY): Payer: Medicare HMO

## 2020-05-21 LAB — MAGNESIUM: Magnesium: 2.4 mg/dL (ref 1.7–2.4)

## 2020-05-21 LAB — GLUCOSE, CAPILLARY
Glucose-Capillary: 102 mg/dL — ABNORMAL HIGH (ref 70–99)
Glucose-Capillary: 113 mg/dL — ABNORMAL HIGH (ref 70–99)
Glucose-Capillary: 123 mg/dL — ABNORMAL HIGH (ref 70–99)
Glucose-Capillary: 201 mg/dL — ABNORMAL HIGH (ref 70–99)
Glucose-Capillary: 241 mg/dL — ABNORMAL HIGH (ref 70–99)
Glucose-Capillary: 264 mg/dL — ABNORMAL HIGH (ref 70–99)
Glucose-Capillary: 84 mg/dL (ref 70–99)

## 2020-05-21 LAB — PHOSPHORUS: Phosphorus: 2.9 mg/dL (ref 2.5–4.6)

## 2020-05-21 MED ORDER — DOCUSATE SODIUM 100 MG PO CAPS
200.0000 mg | ORAL_CAPSULE | Freq: Every day | ORAL | Status: DC
  Administered 2020-05-21 – 2020-05-22 (×2): 200 mg via ORAL
  Filled 2020-05-21 (×2): qty 2

## 2020-05-21 MED ORDER — ROSUVASTATIN CALCIUM 5 MG PO TABS
10.0000 mg | ORAL_TABLET | Freq: Every day | ORAL | Status: DC
  Administered 2020-05-21 – 2020-05-22 (×2): 10 mg via ORAL
  Filled 2020-05-21 (×2): qty 2

## 2020-05-21 MED ORDER — PANTOPRAZOLE SODIUM 40 MG PO TBEC
40.0000 mg | DELAYED_RELEASE_TABLET | Freq: Every day | ORAL | Status: DC
  Administered 2020-05-21 – 2020-05-23 (×3): 40 mg via ORAL
  Filled 2020-05-21 (×3): qty 1

## 2020-05-21 MED ORDER — LEVOTHYROXINE SODIUM 75 MCG PO TABS
75.0000 ug | ORAL_TABLET | Freq: Every day | ORAL | Status: DC
  Administered 2020-05-22 – 2020-05-23 (×2): 75 ug via ORAL
  Filled 2020-05-21 (×2): qty 1

## 2020-05-21 MED ORDER — OXYCODONE HCL 5 MG PO TABS: 5.0000 mg | ORAL_TABLET | ORAL | Status: DC | PRN

## 2020-05-21 MED ORDER — TRAMADOL HCL 50 MG PO TABS
50.0000 mg | ORAL_TABLET | ORAL | Status: DC | PRN
  Administered 2020-05-23: 50 mg via ORAL
  Filled 2020-05-21: qty 1

## 2020-05-21 MED ORDER — LACTULOSE 10 GM/15ML PO SOLN: 30.0000 g | Freq: Every day | ORAL | Status: DC | PRN

## 2020-05-21 MED FILL — Mannitol IV Soln 20%: INTRAVENOUS | Qty: 500 | Status: AC

## 2020-05-21 MED FILL — Electrolyte-R (PH 7.4) Solution: INTRAVENOUS | Qty: 6000 | Status: AC

## 2020-05-21 MED FILL — Heparin Sodium (Porcine) Inj 1000 Unit/ML: INTRAMUSCULAR | Qty: 30 | Status: AC

## 2020-05-21 MED FILL — Lidocaine HCl Local Soln Prefilled Syringe 100 MG/5ML (2%): INTRAMUSCULAR | Qty: 5 | Status: AC

## 2020-05-21 MED FILL — Sodium Chloride IV Soln 0.9%: INTRAVENOUS | Qty: 3000 | Status: AC

## 2020-05-21 MED FILL — Potassium Chloride Inj 2 mEq/ML: INTRAVENOUS | Qty: 40 | Status: AC

## 2020-05-21 MED FILL — Heparin Sodium (Porcine) Inj 1000 Unit/ML: INTRAMUSCULAR | Qty: 10 | Status: AC

## 2020-05-21 MED FILL — Magnesium Sulfate Inj 50%: INTRAMUSCULAR | Qty: 10 | Status: AC

## 2020-05-21 MED FILL — Sodium Bicarbonate IV Soln 8.4%: INTRAVENOUS | Qty: 100 | Status: AC

## 2020-05-21 NOTE — TOC Initial Note (Signed)
Transition of Care [facility]) - Initial/Assessment Note    Patient Details  Name: Tiffany White MRN: [id number] Date of Birth: 1945/03/16  Transition of Care [facility]) CM/SW Contact:    Tresa Endo Phone Number: 05/21/2020, 1:26 PM  Clinical Narrative:                 11:00am- CSW spoke with pt daughter about SNF at dc, she is agreeable to SNF she would like facilities anywhere between [place], [place] and [place]. CSW provided medicare.gov resources and will follow up with pt daughter.        Patient Goals and CMS Choice        Expected Discharge Plan and Services                                                Prior Living Arrangements/Services                       Activities of Daily Living Home Assistive Devices/Equipment: CBG Meter ADL Screening (condition at time of admission) Patient's cognitive ability adequate to safely complete daily activities?: Yes Is the patient deaf or have difficulty hearing?: No Does the patient have difficulty seeing, even when wearing glasses/contacts?: No Does the patient have difficulty concentrating, remembering, or making decisions?: No Patient able to express need for assistance with ADLs?: Yes Does the patient have difficulty dressing or bathing?: No Independently performs ADLs?: Yes (appropriate for developmental age) Does the patient have difficulty walking or climbing stairs?: No Weakness of Legs: None Weakness of Arms/Hands: None  Permission Sought/Granted                  Emotional Assessment              Admission diagnosis:  SOB (shortness of breath) [R06.02] Myocardial injury [I5A] Elevated troponin [R77.8] Chest pain [R07.9] Angina pectoris ([place]) [I20.9] S/P CABG x 4 [Z95.1] Patient Active Problem List   Diagnosis Date Noted  . S/P CABG x 4 05/17/2020  . Angina pectoris ([place]) 05/14/2020  . Coronary artery disease involving native coronary artery of native heart with unstable  angina pectoris ([place])   . Chest pain 05/13/2020  . Diabetes mellitus, type II ([place]) 05/13/2020  . Elevated troponin I level 05/13/2020  . Hyperglycemia due to diabetes mellitus ([place]) 05/13/2020  . Vitamin B12 deficiency 05/13/2020  . Chronic kidney disease, stage 3a ([place]) 08/27/2019  . Essential hypertension 08/27/2019  . 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[place]) 07/12/2016  . Hypothyroidism 04/28/2016  . Symptomatic menopausal or female climacteric states 03/21/2014  . Hypertriglyceridemia 10/03/2007  . Vitamin D deficiency 10/20/2006  . Other specified disorders of cartilage, unspecified sites 10/13/2006  . Herpes zoster 06/30/2001  . Benign neoplasm of colon 09/02/1998  . Acute pancreatitis 04/02/1991   PCP:  Almira Bar, MD Pharmacy:   [facility], [place] [address] Phone: [phone] Fax: [phone]     Social Determinants of Health (SDOH) Interventions    Readmission Risk Interventions No flowsheet data found.

## 2020-05-21 NOTE — Progress Notes (Signed)
  Speech Language Pathology Treatment: Dysphagia  Patient Details Name: Tiffany White MRN: [id number] DOB: [birth year] Today's Date: 05/21/2020 Time: 3546-5681 SLP Time Calculation (min) (ACUTE ONLY): 16 min  Assessment / Plan / Recommendation Clinical Impression  Immediate and frequent throat clearing was observed with all trials of thin liquids; however, no overt coughing occurred even with consecutive sips of larger volumes of water. Delayed throat clearing was noted with more solid textures but this occurred less often in comparison to liquids. Pt states that PTA she did not clear her throat while eating/drinking. She also reports no subjective difficulties with her swallowing function and denies any nausea. However, an MBS has been scheduled for today to further examine her oropharyngeal swallow especially since she is more at risk for negative implications from potential aspiration. In the interim, recommend that she remain on a clear liquid diet until the most appropriate diet can be determined for her at this time.   HPI HPI: Tiffany White is a 75 y.o. female with medical history significant for hypertension, T2DM, hypothyroidism who presents to the emergency department due to 7-10-day onset of intermittent chest pain.  Pt underwent CABG on 4/16 and was intubated for procedure.      SLP Plan  MBS       Recommendations  Diet recommendations: Thin liquid (Clear liquid diet) Liquids provided via: Cup;Straw Medication Administration: Crushed with puree Supervision: Patient able to self feed;Full supervision/cueing for compensatory strategies Compensations: Slow rate;Small sips/bites Postural Changes and/or Swallow Maneuvers: Seated upright 90 degrees;Upright 30-60 min after meal                Oral Care Recommendations: Oral care QID Follow up Recommendations: Skilled Nursing facility;24 hour supervision/assistance SLP Visit Diagnosis: Dysphagia, unspecified (R13.10) Plan:  MBS       GO                Jeanine Luz., SLP Student 05/21/2020, 9:19 AM

## 2020-05-21 NOTE — Progress Notes (Signed)
Physical Therapy Treatment Patient Details Name: Tiffany White MRN: [id number] DOB: October 10, 1945 Today's Date: 05/21/2020    History of Present Illness Pt is a 75 year old woman admitted with chest pain x 1 week on 05/14/20. 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 Hospital course complicated by delirium.  PMH: HTN, DM2, hypothyroidism.    PT Comments    Pt restless upon arrival to room, pulling at lines/leads with O2 off and SPO2 86%. Pt requiring 3LO2 to maintain SpO2 today, with frequent cues for breathing technique to recover sats from low 90s%. Pt ambulatory for short distance in hallway, limited by dizziness (BP stable), fatigue, and poor safety awareness. PT educated pt on the importance of following sternal precautions to decrease possibility of post-surgical complications, pt appears unconcerned about following precautions throughout mobility. PT to continue to follow acutely.  RRMax 30 breaths/min, HR stable, SpO2 90% and greater on 3LO2    Follow Up Recommendations  SNF;Supervision/Assistance - 24 hour     Equipment Recommendations  Rolling walker with 5" wheels    Recommendations for Other Services       Precautions / Restrictions Precautions Precautions: Fall;Sternal;Other (comment) Precaution Comments: verbally educated in sternal precautions, "move in the tube" Restrictions Weight Bearing Restrictions: No    Mobility  Bed Mobility Overal bed mobility: Needs Assistance Bed Mobility: Supine to Sit;Sit to Supine     Supine to sit: Min assist Sit to supine: Min assist   General bed mobility comments: min assist for log roll into and out of bed for truncal translation and LE lifting into bed. Boost up in bed assist required.    Transfers Overall transfer level: Needs assistance Equipment used: Rolling walker (2 wheeled) Transfers: Sit to/from Stand Sit to Stand: Min assist;From elevated surface;+2 safety/equipment         General transfer comment: min assist for power  up, rise, and steady. Repeated sit<>stands as intervention, cuing for proper hand placement to maintain sternal precautions which pt follows 50% of the time  Ambulation/Gait Ambulation/Gait assistance: Min assist;+2 safety/equipment Gait Distance (Feet): 60 Feet Assistive device: Rolling walker (2 wheeled) Gait Pattern/deviations: Step-through pattern;Decreased stride length;Trunk flexed Gait velocity: decr   General Gait Details: min assist to steady and correct L drifting, pt unaware even with cuing. VC for upright posture, placement in RW, breathing technique when dyspnea on exertion noted. SPO2 90%and greater on 3LO2 during mobility.   Stairs             Wheelchair Mobility    Modified Rankin (Stroke Patients Only)       Balance Overall balance assessment: Needs assistance Sitting-balance support: No upper extremity supported;Feet supported Sitting balance-Leahy Scale: Fair     Standing balance support: Bilateral upper extremity supported;During functional activity Standing balance-Leahy Scale: Poor Standing balance comment: reliant on external support                            Cognition Arousal/Alertness: Awake/alert Behavior During Therapy: WFL for tasks assessed/performed Overall Cognitive Status: Impaired/Different from baseline Area of Impairment: Safety/judgement;Following commands;Memory;Problem solving                     Memory: Decreased recall of precautions Following Commands: Follows one step commands with increased time Safety/Judgement: Decreased awareness of safety;Decreased awareness of deficits   Problem Solving: Difficulty sequencing;Requires verbal cues;Requires tactile cues General Comments: pt pulling off lines/leads upon PT arrival to room, when PT and pt's daughter tell pt  she needs to stop pt states "I know" and keeps doing it. Pt with no recall of sternal precautions, does not recall what surgery she had. Pt with poor  safety awareness and requires max cuing for safety and precaution maintenance, pt many times disregards PT cues.      Exercises Other Exercises Other Exercises: Sit<>stand from EOB, x4, cues for hand placement    General Comments General comments (skin integrity, edema, etc.): RRMax 30 breaths/min, HR stable, SpO2 90% and greater on 3LO2      Pertinent Vitals/Pain Pain Assessment: Faces Faces Pain Scale: Hurts little more Pain Location: chest incision Pain Descriptors / Indicators: Discomfort;Grimacing;Guarding Pain Intervention(s): Limited activity within patient's tolerance;Monitored during session;Repositioned    Home Living                      Prior Function            PT Goals (current goals can now be found in the care plan section) Acute Rehab PT Goals Patient Stated Goal: to go home PT Goal Formulation: With patient Time For Goal Achievement: 06/03/20 Potential to Achieve Goals: Good Progress towards PT goals: Progressing toward goals    Frequency    Min 3X/week      PT Plan Current plan remains appropriate    Co-evaluation              AM-PAC PT "6 Clicks" Mobility   Outcome Measure  Help needed turning from your back to your side while in a flat bed without using bedrails?: A Little Help needed moving from lying on your back to sitting on the side of a flat bed without using bedrails?: A Little Help needed moving to and from a bed to a chair (including a wheelchair)?: A Little Help needed standing up from a chair using your arms (e.g., wheelchair or bedside chair)?: A Little Help needed to walk in hospital room?: A Little Help needed climbing 3-5 steps with a railing? : A Lot 6 Click Score: 17    End of Session Equipment Utilized During Treatment: Oxygen Activity Tolerance: Patient tolerated treatment well;Patient limited by fatigue Patient left: in bed;with call bell/phone within reach;with bed alarm set;with family/visitor  present Nurse Communication: Mobility status PT Visit Diagnosis: Unsteadiness on feet (R26.81);Muscle weakness (generalized) (M62.81);Difficulty in walking, not elsewhere classified (R26.2);Pain     Time: 6160-7371 PT Time Calculation (min) (ACUTE ONLY): 24 min  Charges:  $Gait Training: 8-22 mins $Therapeutic Activity: 8-22 mins                     Stacie Glaze, PT DPT Acute Rehabilitation Services Pager [phone]  Office [phone]   [place] 05/21/2020, 4:01 PM

## 2020-05-21 NOTE — NC FL2 (Addendum)
[place] LEVEL OF CARE SCREENING TOOL     IDENTIFICATION  Patient Name: Tiffany White Birthdate: 1945-12-17 Sex: female Admission Date (Current Location): 05/12/2020  [facility] and [place] Number:  Herbalist and Address:  [facility] [place] [address] [place], [place] [address]      Provider Number: [id number]  Attending Physician Name and Address:  Wonda Olds, MD  Relative Name and Phone Number:  Joelene Millin (Daughter) [phone]    Current Level of Care: Hospital Recommended Level of Care: [place] Prior Approval Number:    Date Approved/Denied:   PASRR Number: [id number]  Discharge Plan: SNF    Current Diagnoses: Patient Active Problem List   Diagnosis Date Noted  . S/P CABG x 4 05/17/2020  . Angina pectoris ([place]) 05/14/2020  . Coronary artery disease involving native coronary artery of native heart with unstable angina pectoris ([place])   . Chest pain 05/13/2020  . Diabetes mellitus, type II ([place]) 05/13/2020  . Elevated troponin I level 05/13/2020  . Hyperglycemia due to diabetes mellitus ([place]) 05/13/2020  . Vitamin B12 deficiency 05/13/2020  . Chronic kidney disease, stage 3a ([place]) 08/27/2019  . Essential hypertension 08/27/2019  . 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[place]) 07/12/2016  . Hypothyroidism 04/28/2016  . Symptomatic menopausal or female climacteric states 03/21/2014  . Hypertriglyceridemia 10/03/2007  . Vitamin D deficiency 10/20/2006  . Other specified disorders of cartilage, unspecified sites 10/13/2006  . Herpes zoster 06/30/2001  . Benign neoplasm of colon 09/02/1998  . Acute pancreatitis 04/02/1991    Orientation RESPIRATION BLADDER Height & Weight     Self  O2 (2L nasal cannula) Continent,Indwelling catheter Weight: 151 lb 7.3 oz (68.7 kg) Height:  5' 2.5" (158.8 cm)  BEHAVIORAL SYMPTOMS/MOOD NEUROLOGICAL BOWEL NUTRITION STATUS      Continent Diet (See DC  summary)  AMBULATORY STATUS COMMUNICATION OF NEEDS Skin   Extensive Assist Verbally Surgical wounds (Closed incision on right leg, closed incision on chest)                       Personal Care Assistance Level of Assistance  Bathing,Feeding,Dressing Bathing Assistance: Maximum assistance Feeding assistance: Independent Dressing Assistance: Maximum assistance     Functional Limitations Info  Sight,Hearing,Speech Sight Info: Impaired Hearing Info: Adequate Speech Info: Adequate    SPECIAL CARE FACTORS FREQUENCY  PT (By licensed PT),OT (By licensed OT)     PT Frequency: 5X per week OT Frequency: 5X per week            Contractures      Additional Factors Info  Allergies,Code Status,Insulin Sliding Scale Code Status Info: FULL Allergies Info: Statins   Insulin Sliding Scale Info: See DC summary       Current Medications (05/21/2020):  This is the current hospital active medication list Current Facility-Administered Medications  Medication Dose Route Frequency Provider Last Rate Last Admin  . acetaminophen (TYLENOL) tablet 1,000 mg  1,000 mg Oral Q6H Atkins, Glenice Bow, MD   1,000 mg at 05/21/20 0015   Or  . acetaminophen (TYLENOL) 160 MG/5ML solution 1,000 mg  1,000 mg Per Tube Q6H Wonda Olds, MD   1,000 mg at 05/21/20 0549  . acetaminophen (TYLENOL) suppository 650 mg  650 mg Rectal Q6H PRN Wonda Olds, MD      . aspirin EC tablet 325 mg  325 mg Oral Daily Wonda Olds, MD   325 mg at 05/21/20  0917   Or  . aspirin chewable tablet 324 mg  324 mg Per Tube Daily Wonda Olds, MD   324 mg at 05/20/20 0949  . bisacodyl (DULCOLAX) EC tablet 10 mg  10 mg Oral Daily Wonda Olds, MD   10 mg at 05/21/20 9767   Or  . bisacodyl (DULCOLAX) suppository 10 mg  10 mg Rectal Daily Wonda Olds, MD      . Chlorhexidine Gluconate Cloth 2 % PADS 6 each  6 each Topical Daily Wonda Olds, MD   6 each at 05/21/20 [phone]  . dextrose 50 % solution  0-50 mL  0-50 mL Intravenous PRN Atkins, Glenice Bow, MD      . docusate sodium (COLACE) capsule 200 mg  200 mg Oral Daily Wonda Olds, MD   200 mg at 05/21/20 0917  . enoxaparin (LOVENOX) injection 30 mg  30 mg Subcutaneous Q24H Wonda Olds, MD   30 mg at 05/20/20 1724  . feeding supplement (OSMOLITE 1.5 CAL) liquid 1,000 mL  1,000 mL Per Tube Continuous Wonda Olds, MD   Stopped at 05/19/20 1630  . feeding supplement (PROSource TF) liquid 45 mL  45 mL Per Tube Daily Wonda Olds, MD   45 mL at 05/19/20 1826  . furosemide (LASIX) injection 40 mg  40 mg Intravenous Daily Wonda Olds, MD   40 mg at 05/21/20 3790  . haloperidol lactate (HALDOL) injection 0.5 mg  0.5 mg Intravenous Q6H PRN Wonda Olds, MD      . hydrALAZINE (APRESOLINE) injection 5 mg  5 mg Intravenous Q6H Atkins, Glenice Bow, MD   5 mg at 05/21/20 1247  . insulin aspart (novoLOG) injection 0-24 Units  0-24 Units Subcutaneous Q4H Wonda Olds, MD   12 Units at 05/21/20 1246  . lactated ringers infusion 500 mL  500 mL Intravenous Once PRN Atkins, Glenice Bow, MD      . lactulose (CHRONULAC) 10 GM/15ML solution 30 g  30 g Oral Daily PRN Gold, Wayne E, PA-C      . levalbuterol (XOPENEX) nebulizer solution 0.63 mg  0.63 mg Nebulization Q6H PRN Wonda Olds, MD      . Derrill Memo ON 05/22/2020] levothyroxine (SYNTHROID) tablet 75 mcg  75 mcg Oral Q0600 Wonda Olds, MD      . MEDLINE mouth rinse  15 mL Mouth Rinse BID Wonda Olds, MD   15 mL at 05/21/20 0917  . metoprolol tartrate (LOPRESSOR) injection 5 mg  5 mg Intravenous Q6H Atkins, Glenice Bow, MD   5 mg at 05/21/20 1247  . ondansetron (ZOFRAN) injection 4 mg  4 mg Intravenous Q6H PRN Wonda Olds, MD   4 mg at 05/19/20 1633  . oxyCODONE (Oxy IR/ROXICODONE) immediate release tablet 5-10 mg  5-10 mg Oral Q3H PRN Atkins, Glenice Bow, MD      . pantoprazole (PROTONIX) EC tablet 40 mg  40 mg Oral Daily Wonda Olds, MD   40 mg at 05/21/20  0917  . polyethylene glycol (MIRALAX / GLYCOLAX) packet 17 g  17 g Oral Daily Gold, Wayne E, PA-C   17 g at 05/21/20 2409  . rosuvastatin (CRESTOR) tablet 10 mg  10 mg Oral q1800 Atkins, Glenice Bow, MD      . traMADol (ULTRAM) tablet 50-100 mg  50-100 mg Oral Q4H PRN Wonda Olds, MD         Discharge Medications: Please see discharge summary for  a list of discharge medications.  Relevant Imaging Results:  Relevant Lab Results:   Additional Information SSN: [id number]  Glennon Hamilton, Student-Social Work

## 2020-05-21 NOTE — Plan of Care (Signed)
  Problem: Education: Goal: Knowledge of General Education information will improve Description: Including pain rating scale, medication(s)/side effects and non-pharmacologic comfort measures Outcome: Progressing   Problem: Clinical Measurements: Goal: Ability to maintain clinical measurements within normal limits will improve Outcome: Progressing Goal: Will remain free from infection Outcome: Progressing Goal: Diagnostic test results will improve Outcome: Progressing Goal: Respiratory complications will improve Outcome: Progressing Goal: Cardiovascular complication will be avoided Outcome: Progressing   Problem: Skin Integrity: Goal: Wound healing without signs and symptoms of infection Outcome: Progressing Goal: Risk for impaired skin integrity will decrease Outcome: Progressing   Problem: Activity: Goal: Risk for activity intolerance will decrease Outcome: Not Progressing   Problem: Elimination: Goal: Will not experience complications related to bowel motility Outcome: Not Progressing   Problem: Pain Managment: Goal: General experience of comfort will improve Outcome: Not Progressing   Problem: Activity: Goal: Ability to tolerate increased activity will improve Outcome: Not Progressing

## 2020-05-21 NOTE — Progress Notes (Signed)
Modified Barium Swallow Progress Note  Patient Details  Name: Tiffany White MRN: [id number] Date of Birth: 10/26/1945  Today's Date: 05/21/2020  Modified Barium Swallow completed.  Full report located under Chart Review in the Imaging Section.  Brief recommendations include the following:  Clinical Impression  Pt presents with an oropharyngeal swallow that appears both functional and safe. During her oral phase, she did require multiple pumps to propel boluses and also had decreased bolus cohesion with more solid textures; however, this did not impact the safety or efficiency of her swallow. No aspiration or penetration occurred across all trials of all consistencies. She was observed to initiate swallows at the level of her pyriform sinuses but this did not negatively impact her airway protection. Based on these findings, recommend a regular diet and thin liquids. SLP will sign off at this time.    Swallow Evaluation Recommendations       SLP Diet Recommendations: Regular solids;Thin liquid   Liquid Administration via: Cup;Straw   Medication Administration: Whole meds with liquid   Supervision: Patient able to self feed;Intermittent supervision to cue for compensatory strategies   Compensations: Slow rate;Small sips/bites   Postural Changes: Remain semi-upright after after feeds/meals (Comment);Seated upright at 90 degrees   Oral Care Recommendations: Oral care BID        Jeanine Luz., SLP Student 05/21/2020,11:13 AM

## 2020-05-21 NOTE — Progress Notes (Signed)
CARDIAC REHAB PHASE I   PRE:  Rate/Rhythm: 81 SR  BP:  Supine:   Sitting: 110/42  Standing:    SaO2: 92% 2L  MODE:  Ambulation: 120 ft   POST:  Rate/Rhythm: 92 SR  BP:  Supine:   Sitting: `28/52  Standing:    SaO2: 91%2L 7096-4383 Pt walked 120 ft on 2L with gait belt use, rolling walker and asst x 2. Pt rocked and use assistance to stand. Slow gait and some difficulty with rolling walker. Stopped several times to rest. Unsteady. Back to bed after walk with bed alarm, call lights and pads on floor. Gave pt IS and she demonstrated 500 ml correctly 4 times and gave her flutter valve and she did it four times.  Pt could recall after teaching that she needs to do each 10x an hour to assist with pulmonary improvement. Pt tolerated well with vital signs stable.   Graylon Good, RN BSN  05/21/2020 8:48 AM

## 2020-05-21 NOTE — Care Management Important Message (Signed)
Important Message  Patient Details  Name: Tiffany White MRN: [id number] Date of Birth: 17-Apr-1945   Medicare Important Message Given:  Yes     Orbie Pyo 05/21/2020, 2:41 PM

## 2020-05-21 NOTE — Progress Notes (Addendum)
[address]       [employer] [address]             [phone]      4 Days Post-Op Procedure(s) (LRB): CORONARY ARTERY BYPASS GRAFTING (CABG) TIMES FOUR, ON PUMP, USING LEFT INTERNAL MAMMARY ARTERY AND ENDOSCOPICALLY HARVESTED RIGHT GREATER SAPHENOUS VEIN (N/A) TRANSESOPHAGEAL ECHOCARDIOGRAM (TEE) (N/A) INDOCYANINE GREEN FLUORESCENCE IMAGING (ICG) (N/A) ENDOVEIN HARVEST OF GREATER SAPHENOUS VEIN (Right) Subjective: Doing ok with thin liquids  Objective: Vital signs in last 24 hours: Temp:  [97.3 F (36.3 C)-98.2 F (36.8 C)] 98.2 F (36.8 C) (04/20 0013) Pulse Rate:  [71-84] 83 (04/20 0600) Cardiac Rhythm: Normal sinus rhythm (04/20 0700) Resp:  [16-32] 18 (04/20 0600) BP: (98-138)/(47-74) 136/64 (04/20 0600) SpO2:  [80 %-100 %] 80 % (04/20 0600) Weight:  [68.7 kg] 68.7 kg (04/20 0500)  Hemodynamic parameters for last 24 hours:    Intake/Output from previous day: 04/19 0701 - 04/20 0700 In: 1799.2 [P.O.:155; I.V.:1634.2; NG/GT:10] Out: 2228 [Urine:2228] Intake/Output this shift: No intake/output data recorded.  General appearance: alert, cooperative and no distress Heart: regular rate and rhythm Lungs: min dim in left base Abdomen: benign Extremities: no edema Wound: incis healing well  Lab Results: Recent Labs    05/18/20 1549 05/19/20 0338  WBC 12.0* 12.0*  HGB 10.0* 10.6*  HCT 30.4* 32.8*  PLT 166 171   BMET:  Recent Labs    05/19/20 1331 05/20/20 0724  NA 139 142  K 3.2* 4.3  CL 103 107  CO2 27 27  GLUCOSE 233* 139*  BUN 18 24*  CREATININE 1.16* 1.08*  CALCIUM 7.9* 8.2*    PT/INR: No results for input(s): LABPROT, INR in the last 72 hours. ABG    Component Value Date/Time   PHART 7.360 05/18/2020 0844   HCO3 21.1 05/18/2020 0844   TCO2 22 05/18/2020 0844   ACIDBASEDEF 4.0 (H) 05/18/2020 0844   O2SAT 95.0 05/18/2020 0844   CBG (last 3)  Recent Labs    05/20/20 2019 05/21/20 0009 05/21/20 0334  GLUCAP 173*  113* 102*    Meds Scheduled Meds: . acetaminophen  1,000 mg Oral Q6H   Or  . acetaminophen (TYLENOL) oral liquid 160 mg/5 mL  1,000 mg Per Tube Q6H  . aspirin EC  325 mg Oral Daily   Or  . aspirin  324 mg Per Tube Daily  . bisacodyl  10 mg Oral Daily   Or  . bisacodyl  10 mg Rectal Daily  . Chlorhexidine Gluconate Cloth  6 each Topical Daily  . docusate sodium  200 mg Oral Daily  . enoxaparin (LOVENOX) injection  30 mg Subcutaneous Q24H  . feeding supplement (PROSource TF)  45 mL Per Tube Daily  . furosemide  40 mg Intravenous Daily  . hydrALAZINE  5 mg Intravenous Q6H  . insulin aspart  0-24 Units Subcutaneous Q4H  . [START ON 05/22/2020] levothyroxine  75 mcg Oral Q0600  . mouth rinse  15 mL Mouth Rinse BID  . metoprolol tartrate  5 mg Intravenous Q6H  . polyethylene glycol  17 g Oral Daily  . rosuvastatin  10 mg Oral q1800   Continuous Infusions: . electrolyte-A 75 mL/hr at 05/21/20 0611  . feeding supplement (OSMOLITE 1.5 CAL) Stopped (05/19/20 1630)  . lactated ringers     PRN Meds:.acetaminophen, dextrose, haloperidol lactate, lactated ringers, levalbuterol, ondansetron (ZOFRAN) IV, oxyCODONE, traMADol  Xrays DG Chest Port 1 View  Result Date: 05/20/2020 CLINICAL DATA:  Sore chest. EXAM: PORTABLE CHEST 1 VIEW COMPARISON:  05/19/2020. FINDINGS: Interim removal of right IJ sheath. PICC line noted with tip over cavoatrial junction. Prior CABG. Cardiomegaly. Low lung volumes with persistent bibasilar and infiltrates/edema. Small left pleural effusion. No pneumothorax. IMPRESSION: 1. Interim removal of right IJ sheath. PICC line noted with tip over cavoatrial junction. 2.  Prior CABG.  Stable cardiomegaly. 3. Low lung volumes with bibasilar atelectasis and infiltrates/edema. Small left pleural effusion. A component of CHF cannot be excluded. Electronically Signed   By: Marcello Moores  Register   On: 05/20/2020 06:40   [place] EKG SITE RITE  Result Date: 05/19/2020 If Site Rite image not  attached, placement could not be confirmed due to current cardiac rhythm.   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, ON PUMP, USING LEFT INTERNAL MAMMARY ARTERY AND ENDOSCOPICALLY HARVESTED RIGHT GREATER SAPHENOUS VEIN (N/A) TRANSESOPHAGEAL ECHOCARDIOGRAM (TEE) (N/A) INDOCYANINE GREEN FLUORESCENCE IMAGING (ICG) (N/A) ENDOVEIN HARVEST OF GREATER SAPHENOUS VEIN (Right)  1 afeb, VSS SBP 90's-130's 2 sats ok on 2 liters 3 CBG adeq control 4 phosphorus 2.9, Mg++ 2.4 5 should be able to advance diet - ST assisting with management 6 Cont therapies - will get [facility] consult for SNF temporary placement for further rehab 7 d/c epw's 8 try lactulose for BM      LOS: 7 days    John Giovanni PA-C Pager [phone] 05/21/2020 Pt seen and examined; doing better. CXR shows left effusion. Will arrange IR thoracentesis tomorrow. Esias Mory Z. Orvan Seen, [place]

## 2020-05-22 ENCOUNTER — Inpatient Hospital Stay (HOSPITAL_COMMUNITY): Payer: Medicare HMO

## 2020-05-22 HISTORY — PX: IR THORACENTESIS ASP PLEURAL SPACE W/IMG GUIDE: IMG5380

## 2020-05-22 LAB — GLUCOSE, CAPILLARY
Glucose-Capillary: 102 mg/dL — ABNORMAL HIGH (ref 70–99)
Glucose-Capillary: 145 mg/dL — ABNORMAL HIGH (ref 70–99)
Glucose-Capillary: 153 mg/dL — ABNORMAL HIGH (ref 70–99)
Glucose-Capillary: 209 mg/dL — ABNORMAL HIGH (ref 70–99)
Glucose-Capillary: 274 mg/dL — ABNORMAL HIGH (ref 70–99)
Glucose-Capillary: 66 mg/dL — ABNORMAL LOW (ref 70–99)
Glucose-Capillary: 95 mg/dL (ref 70–99)

## 2020-05-22 LAB — COMPREHENSIVE METABOLIC PANEL
ALT: 16 U/L (ref 0–44)
AST: 18 U/L (ref 15–41)
Albumin: 2.5 g/dL — ABNORMAL LOW (ref 3.5–5.0)
Alkaline Phosphatase: 56 U/L (ref 38–126)
Anion gap: 7 (ref 5–15)
BUN: 25 mg/dL — ABNORMAL HIGH (ref 8–23)
CO2: 29 mmol/L (ref 22–32)
Calcium: 8.6 mg/dL — ABNORMAL LOW (ref 8.9–10.3)
Chloride: 103 mmol/L (ref 98–111)
Creatinine, Ser: 1.21 mg/dL — ABNORMAL HIGH (ref 0.44–1.00)
GFR, Estimated: 47 mL/min — ABNORMAL LOW (ref >60)
Glucose, Bld: 107 mg/dL — ABNORMAL HIGH (ref 70–99)
Potassium: 3.5 mmol/L (ref 3.5–5.1)
Sodium: 139 mmol/L (ref 135–145)
Total Bilirubin: 0.7 mg/dL (ref 0.3–1.2)
Total Protein: 5.3 g/dL — ABNORMAL LOW (ref 6.5–8.1)

## 2020-05-22 LAB — CBC
HCT: 31.3 % — ABNORMAL LOW (ref 36.0–46.0)
Hemoglobin: 9.8 g/dL — ABNORMAL LOW (ref 12.0–15.0)
MCH: 29.3 pg (ref 26.0–34.0)
MCHC: 31.3 g/dL (ref 30.0–36.0)
MCV: 93.7 fL (ref 80.0–100.0)
Platelets: 238 10*3/uL (ref 150–400)
RBC: 3.34 MIL/uL — ABNORMAL LOW (ref 3.87–5.11)
RDW: 14.6 % (ref 11.5–15.5)
WBC: 9.2 10*3/uL (ref 4.0–10.5)
nRBC: 0 % (ref 0.0–0.2)

## 2020-05-22 LAB — SARS CORONAVIRUS 2 (TAT 6-24 HRS): SARS Coronavirus 2: NEGATIVE

## 2020-05-22 MED ORDER — ENSURE ENLIVE PO LIQD
237.0000 mL | Freq: Two times a day (BID) | ORAL | Status: DC
  Administered 2020-05-22 – 2020-05-23 (×2): 237 mL via ORAL

## 2020-05-22 MED ORDER — METFORMIN HCL 500 MG PO TABS
1000.0000 mg | ORAL_TABLET | Freq: Two times a day (BID) | ORAL | Status: DC
  Administered 2020-05-22 – 2020-05-23 (×3): 1000 mg via ORAL
  Filled 2020-05-22 (×3): qty 2

## 2020-05-22 MED ORDER — ADULT MULTIVITAMIN W/MINERALS CH
1.0000 | ORAL_TABLET | Freq: Every day | ORAL | Status: DC
  Administered 2020-05-22 – 2020-05-23 (×2): 1 via ORAL
  Filled 2020-05-22 (×2): qty 1

## 2020-05-22 MED ORDER — LIDOCAINE HCL 1 % IJ SOLN
INTRAMUSCULAR | Status: AC
  Filled 2020-05-22: qty 20

## 2020-05-22 MED ORDER — METOPROLOL TARTRATE 12.5 MG HALF TABLET
12.5000 mg | ORAL_TABLET | Freq: Two times a day (BID) | ORAL | Status: DC
  Administered 2020-05-22 – 2020-05-23 (×3): 12.5 mg via ORAL
  Filled 2020-05-22 (×3): qty 1

## 2020-05-22 MED ORDER — POTASSIUM CHLORIDE CRYS ER 20 MEQ PO TBCR
40.0000 meq | EXTENDED_RELEASE_TABLET | Freq: Every day | ORAL | Status: DC
  Administered 2020-05-22 – 2020-05-23 (×2): 40 meq via ORAL
  Filled 2020-05-22 (×2): qty 2

## 2020-05-22 MED ORDER — LIDOCAINE HCL 1 % IJ SOLN
INTRAMUSCULAR | Status: DC | PRN
  Administered 2020-05-22: 10 mL

## 2020-05-22 NOTE — TOC Progression Note (Addendum)
Transition of Care [facility]) - Progression Note    Patient Details  Name: Tiffany White MRN: [id number] Date of Birth: 06-02-1945  Transition of [facility]) CM/SW [place], [place] Phone Number: 05/22/2020, 9:55 AM  Clinical Narrative:    9:50am- CSW spoke with admissions at [facility], they are able to accept pt. CSW started [place], ref# [id number]. CSW contacted pt daugther to provide follow up, there was no answer CSW left VM. CSW will follow up.   4:20pm- CSW confirmed auth with [facility], ref# [id number] for 5 days. CSW updated facility pt should DC tomorrow pending DC summary. CSW will follow up.        Expected Discharge Plan and Services                                                 Social Determinants of Health (SDOH) Interventions    Readmission Risk Interventions No flowsheet data found.

## 2020-05-22 NOTE — Progress Notes (Addendum)
Tiffany White       [place] [address]             [phone]      5 Days Post-Op Procedure(s) (LRB): CORONARY ARTERY BYPASS GRAFTING (CABG) TIMES FOUR, ON PUMP, USING LEFT INTERNAL MAMMARY ARTERY AND ENDOSCOPICALLY HARVESTED RIGHT GREATER SAPHENOUS VEIN (N/A) TRANSESOPHAGEAL ECHOCARDIOGRAM (TEE) (N/A) INDOCYANINE GREEN FLUORESCENCE IMAGING (ICG) (N/A) ENDOVEIN HARVEST OF GREATER SAPHENOUS VEIN (Right) Subjective: conts to feel better  Objective: Vital signs in last 24 hours: Temp:  [97.6 F (36.4 C)-98.2 F (36.8 C)] 97.6 F (36.4 C) (04/21 0350) Pulse Rate:  [63-86] 63 (04/21 0350) Cardiac Rhythm: Normal sinus rhythm (04/21 0350) Resp:  [18-21] 18 (04/21 0350) BP: (120-152)/(47-64) 126/51 (04/21 0350) SpO2:  [91 %-97 %] 97 % (04/21 0350) Weight:  [72.1 kg] 72.1 kg (04/21 0500)  Hemodynamic parameters for last 24 hours:    Intake/Output from previous day: 04/20 0701 - 04/21 0700 In: 620 [P.O.:600] Out: 850 [Urine:850] Intake/Output this shift: No intake/output data recorded.  General appearance: alert, cooperative and no distress Heart: regular rate and rhythm Lungs: dim left>right base Abdomen: benign Extremities: no edema Wound: incis healing well  Lab Results: Recent Labs    05/22/20 0344  WBC 9.2  HGB 9.8*  HCT 31.3*  PLT 238   BMET:  Recent Labs    05/20/20 0724 05/22/20 0344  NA 142 139  K 4.3 3.5  CL 107 103  CO2 27 29  GLUCOSE 139* 107*  BUN 24* 25*  CREATININE 1.08* 1.21*  CALCIUM 8.2* 8.6*    PT/INR: No results for input(s): LABPROT, INR in the last 72 hours. ABG    Component Value Date/Time   PHART 7.360 05/18/2020 0844   HCO3 21.1 05/18/2020 0844   TCO2 22 05/18/2020 0844   ACIDBASEDEF 4.0 (H) 05/18/2020 0844   O2SAT 95.0 05/18/2020 0844   CBG (last 3)  Recent Labs    05/21/20 2000 05/21/20 2305 05/22/20 0346  GLUCAP 201* 123* 102*    Meds Scheduled Meds: . acetaminophen  1,000 mg Oral Q6H    Or  . acetaminophen (TYLENOL) oral liquid 160 mg/5 mL  1,000 mg Per Tube Q6H  . aspirin EC  325 mg Oral Daily   Or  . aspirin  324 mg Per Tube Daily  . bisacodyl  10 mg Oral Daily   Or  . bisacodyl  10 mg Rectal Daily  . Chlorhexidine Gluconate Cloth  6 each Topical Daily  . docusate sodium  200 mg Oral Daily  . enoxaparin (LOVENOX) injection  30 mg Subcutaneous Q24H  . feeding supplement (PROSource TF)  45 mL Per Tube Daily  . furosemide  40 mg Intravenous Daily  . hydrALAZINE  5 mg Intravenous Q6H  . insulin aspart  0-24 Units Subcutaneous Q4H  . levothyroxine  75 mcg Oral Q0600  . mouth rinse  15 mL Mouth Rinse BID  . metoprolol tartrate  5 mg Intravenous Q6H  . pantoprazole  40 mg Oral Daily  . polyethylene glycol  17 g Oral Daily  . rosuvastatin  10 mg Oral q1800   Continuous Infusions: . feeding supplement (OSMOLITE 1.5 CAL) Stopped (05/19/20 1630)  . lactated ringers     PRN Meds:.acetaminophen, dextrose, haloperidol lactate, lactated ringers, lactulose, levalbuterol, ondansetron (ZOFRAN) IV, oxyCODONE, traMADol  Xrays DG Chest 2 View  Result Date: 05/21/2020 CLINICAL DATA:  Status post cardiac surgery. EXAM: CHEST - 2 VIEW COMPARISON:  May 20, 2020. FINDINGS: Stable cardiomegaly. No pneumothorax is noted. Right-sided PICC line is unchanged in position. Moderate left pleural effusion is noted with associated left basilar atelectasis. Bony thorax is unremarkable. IMPRESSION: Moderate left pleural effusion with associated left basilar atelectasis. Electronically Signed   By: Marijo Conception M.D.   On: 05/21/2020 11:32   DG Swallowing Func-Speech Pathology  Result Date: 05/21/2020 Objective Swallowing Evaluation: Type of Study: MBS-Modified Barium Swallow Study  Patient Details Name: Tiffany White MRN: [id number] Date of Birth: 11-06-1945 Today's Date: 05/21/2020 Time: SLP Start Time (ACUTE ONLY): 1018 -SLP Stop Time (ACUTE ONLY): 1030 SLP Time Calculation (min) (ACUTE ONLY): 12  min Past Medical History: Past Medical History: Diagnosis Date . Diabetes mellitus without complication ([place])  . Hyperlipidemia  . Thyroid disease  Past Surgical History: Past Surgical History: Procedure Laterality Date . ABDOMINAL HYSTERECTOMY   . APPENDECTOMY   . CORONARY ARTERY BYPASS GRAFT N/A 05/17/2020  Procedure: CORONARY ARTERY BYPASS GRAFTING (CABG) TIMES FOUR, ON PUMP, USING LEFT INTERNAL MAMMARY ARTERY AND ENDOSCOPICALLY HARVESTED RIGHT GREATER SAPHENOUS VEIN;  Surgeon: Wonda Olds, MD;  Location: [place];  Service: Open Heart Surgery;  Laterality: N/A; . ENDOVEIN HARVEST OF GREATER SAPHENOUS VEIN Right 05/17/2020  Procedure: ENDOVEIN HARVEST OF GREATER SAPHENOUS VEIN;  Surgeon: Wonda Olds, MD;  Location: [facility];  Service: Open Heart Surgery;  Laterality: Right; . LEFT HEART CATH AND CORONARY ANGIOGRAPHY N/A 05/14/2020  Procedure: LEFT HEART CATH AND CORONARY ANGIOGRAPHY;  Surgeon: Troy Sine, MD;  Location: [facility];  Service: Cardiovascular;  Laterality: N/A; . TEE WITHOUT CARDIOVERSION N/A 05/17/2020  Procedure: TRANSESOPHAGEAL ECHOCARDIOGRAM (TEE);  Surgeon: Wonda Olds, MD;  Location: [facility];  Service: Open Heart Surgery;  Laterality: N/A; . TONSILLECTOMY   HPI: Tiffany White is a 75 y.o. female with medical history significant for hypertension, T2DM, hypothyroidism who presents to the emergency department due to 7-10-day onset of intermittent chest pain.  Pt underwent CABG on 4/16 and was intubated for procedure.  Subjective: Some mild confusion was noted but overall she was alert and cooperative Assessment / Plan / Recommendation CHL IP CLINICAL IMPRESSIONS 05/21/2020 Clinical Impression Pt presents with an oropharyngeal swallow that appears both functional and safe. During her oral phase, she did require multiple pumps to propel boluses and also had decreased bolus cohesion with more solid textures; however, this did not impact the safety or efficiency of her swallow. No  aspiration or penetration occurred across all trials of all consistencies. She was observed to initiate swallows at the level of her pyriform sinuses but this did not negatively impact her airway protection. Based on these findings, recommend a regular diet and thin liquids. SLP will sign off at this time.  SLP Visit Diagnosis Dysphagia, oral phase (R13.11) Attention and concentration deficit following -- Frontal lobe and executive function deficit following -- Impact on safety and function Mild aspiration risk   CHL IP TREATMENT RECOMMENDATION 05/21/2020 Treatment Recommendations No treatment recommended at this time   Prognosis 05/21/2020 Prognosis for Safe Diet Advancement Good Barriers to Reach Goals -- Barriers/Prognosis Comment -- CHL IP DIET RECOMMENDATION 05/21/2020 SLP Diet Recommendations Regular solids;Thin liquid Liquid Administration via Cup;Straw Medication Administration Whole meds with liquid Compensations Slow rate;Small sips/bites Postural Changes Remain semi-upright after after feeds/meals (Comment);Seated upright at 90 degrees   CHL IP OTHER RECOMMENDATIONS 05/21/2020 Recommended Consults -- Oral Care Recommendations Oral care BID Other Recommendations --   CHL IP FOLLOW UP RECOMMENDATIONS 05/21/2020 Follow up Recommendations None  CHL IP FREQUENCY AND DURATION 05/18/2020 Speech Therapy Frequency (ACUTE ONLY) min 2x/week Treatment Duration 2 weeks      CHL IP ORAL PHASE 05/21/2020 Oral Phase Impaired Oral - Pudding Teaspoon -- Oral - Pudding Cup -- Oral - Honey Teaspoon -- Oral - Honey Cup -- Oral - Nectar Teaspoon -- Oral - Nectar Cup -- Oral - Nectar Straw -- Oral - Thin Teaspoon -- Oral - Thin Cup WFL Oral - Thin Straw WFL Oral - Puree Reduced posterior propulsion Oral - Mech Soft Decreased bolus cohesion;Reduced posterior propulsion Oral - Regular -- Oral - Multi-Consistency -- Oral - Pill WFL Oral Phase - Comment --  CHL IP PHARYNGEAL PHASE 05/21/2020 Pharyngeal Phase WFL Pharyngeal- Pudding  Teaspoon -- Pharyngeal -- Pharyngeal- Pudding Cup -- Pharyngeal -- Pharyngeal- Honey Teaspoon -- Pharyngeal -- Pharyngeal- Honey Cup -- Pharyngeal -- Pharyngeal- Nectar Teaspoon -- Pharyngeal -- Pharyngeal- Nectar Cup -- Pharyngeal -- Pharyngeal- Nectar Straw -- Pharyngeal -- Pharyngeal- Thin Teaspoon -- Pharyngeal -- Pharyngeal- Thin Cup -- Pharyngeal -- Pharyngeal- Thin Straw -- Pharyngeal -- Pharyngeal- Puree -- Pharyngeal -- Pharyngeal- Mechanical Soft -- Pharyngeal -- Pharyngeal- Regular -- Pharyngeal -- Pharyngeal- Multi-consistency -- Pharyngeal -- Pharyngeal- Pill -- Pharyngeal -- Pharyngeal Comment --  CHL IP CERVICAL ESOPHAGEAL PHASE 05/21/2020 Cervical Esophageal Phase WFL Pudding Teaspoon -- Pudding Cup -- Honey Teaspoon -- Honey Cup -- Nectar Teaspoon -- Nectar Cup -- Nectar Straw -- Thin Teaspoon -- Thin Cup -- Thin Straw -- Puree -- Mechanical Soft -- Regular -- Multi-consistency -- Pill -- Cervical Esophageal Comment -- Note populated for Lebron Conners, Student SLP Osie Bond., M.A. CCC-SLP Acute Rehabilitation Services Pager [phone] Office [phone] 05/21/2020, 11:28 AM               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, ON PUMP, USING LEFT INTERNAL MAMMARY ARTERY AND ENDOSCOPICALLY HARVESTED RIGHT GREATER SAPHENOUS VEIN (N/A) TRANSESOPHAGEAL ECHOCARDIOGRAM (TEE) (N/A) INDOCYANINE GREEN FLUORESCENCE IMAGING (ICG) (N/A) ENDOVEIN HARVEST OF GREATER SAPHENOUS VEIN (Right)  1 afeb, VSS sBP 120's to 150's 2 sats good on RA- 2 liter range throughout day 3 creat up slightly to 1.21, BUN 25 4 replace  K+ for 3.5 5 H/H pretty stable 6  CBG a bit more variable 84-264 range- restart glucophage 7 good UOP , not sure if weight accurate, prob will be able to stop diuretics soon 8 thoracentesis ordered for today 9 cont therapies 10 SW assisting with placement  LOS: 8 days    Gaspar Bidding Pager [phone] 05/22/2020 Pt seen and  examined; agree with documentation. Looking a little better; await CIR. Janautica Netzley Z. Orvan Seen, [place]

## 2020-05-22 NOTE — Procedures (Signed)
PROCEDURE SUMMARY:  Successful image-guided left thoracentesis. Yielded 200 cc of clear dark amber fluid. Pt tolerated procedure well. No immediate complications. EBL = trace   Specimen was not sent for labs. CXR ordered.  Please see imaging section of Epic for full dictation.  Armando Gang Wilkins Elpers PA-C 05/22/2020 8:17 AM

## 2020-05-22 NOTE — Progress Notes (Signed)
Occupational Therapy Treatment Patient Details Name: Tiffany White MRN: [id number] DOB: 1945-06-04 Today's Date: 05/22/2020    History of present illness Pt is a 75 year old woman admitted with chest pain x 1 week on 05/14/20. 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 Hospital course complicated by delirium.  PMH: HTN, DM2, hypothyroidism.   OT comments  Patient continues to make steady progress towards goals in skilled OT session. Patient's session encompassed sit<>stand transfers from EOB, seated LE exercises and basic self care. Pt with increased fatigue upon arrival, but motivated to participate with therapy. Pt also demonstrating increased cognition and recall of sternal precautions to date, with excellent use of sternal pillow as a reminder to appropriately adhere to precautions with mobility. Pt on RA at beginning of session, however with increased activity at EOB requiring 1L of O2 to remain at appropriate levels (pt remaining at 87% without O2). Pt continues to benefit from skilled therapy sessions in order to increase activity tolerance and progress with independence.    Follow Up Recommendations  SNF;Supervision/Assistance - 24 hour    Equipment Recommendations  3 in 1 bedside commode    Recommendations for Other Services      Precautions / Restrictions Precautions Precautions: Fall;Sternal;Other (comment) Precaution Booklet Issued: No Precaution Comments: verbally educated in sternal precautions, "move in the tube"       Mobility Bed Mobility Overal bed mobility: Needs Assistance Bed Mobility: Supine to Sit;Sit to Supine     Supine to sit: Min assist Sit to supine: Min assist   General bed mobility comments: min A to advance hips but good demonstration of log roll and holding onto pillow to adhere to precautions, min A to return for safety    Transfers Overall transfer level: Needs assistance   Transfers: Sit to/from Stand Sit to Stand: Min assist;From elevated surface          General transfer comment: min A to rise up 2/5 attempts, increased ability to bring "nose over toes" and use sternal pillow as reminder, able to stand for 20 second bouts before fatigue    Balance Overall balance assessment: Needs assistance Sitting-balance support: No upper extremity supported;Feet supported Sitting balance-Leahy Scale: Fair     Standing balance support: Bilateral upper extremity supported;During functional activity Standing balance-Leahy Scale: Fair                             ADL either performed or assessed with clinical judgement   ADL Overall ADL's : Needs assistance/impaired Eating/Feeding: Set up;Sitting   Grooming: Set up;Sitting;Wash/dry hands;Wash/dry face               Lower Body Dressing: Set up Lower Body Dressing Details (indicate cue type and reason): able to complete figure four position adhering appropriately to precautions             Functional mobility during ADLs: Minimal assistance;Cueing for sequencing;Cueing for safety General ADL Comments: pt with fatigue in session, but had already been up in the chair upon therapist's arrival, session focus on sit<>stands at EOB, exercises, and increased awareness of sternal precautions     Vision Baseline Vision/History: Wears glasses Wears Glasses: At all times Patient Visual Report: No change from baseline Additional Comments: pt does not have glasses therefore requires increased cues for asssitance   Perception     Praxis      Cognition Arousal/Alertness: Awake/alert Behavior During Therapy: WFL for tasks assessed/performed Overall Cognitive Status: Impaired/Different from baseline  Area of Impairment: Problem solving;Safety/judgement;Memory;Following commands                     Memory: Decreased recall of precautions Following Commands: Follows multi-step commands with increased time;Follows one step commands consistently Safety/Judgement: Decreased  awareness of safety;Decreased awareness of deficits   Problem Solving: Slow processing General Comments: pt with increased congition to date, but requires increased time to state sternal precautions but able to do so with min verbal cues, excellent adherence to bed mobility with precautions        Exercises General Exercises - Lower Extremity Ankle Circles/Pumps: AROM;Supine;Both;10 reps Straight Leg Raises: AROM;Both;10 reps;Seated Hip Flexion/Marching: AROM;Seated;Both;10 reps   Shoulder Instructions       General Comments      Pertinent Vitals/ Pain       Pain Assessment: Faces Faces Pain Scale: Hurts a little bit Pain Location: chest incision Pain Descriptors / Indicators: Discomfort;Grimacing;Guarding Pain Intervention(s): Limited activity within patient's tolerance;Monitored during session;Repositioned  Home Living                                          Prior Functioning/Environment              Frequency  Min 2X/week        Progress Toward Goals  OT Goals(current goals can now be found in the care plan section)  Progress towards OT goals: Progressing toward goals  Acute Rehab OT Goals Patient Stated Goal: to go home OT Goal Formulation: With patient Time For Goal Achievement: 06/03/20 Potential to Achieve Goals: Good  Plan Discharge plan remains appropriate    Co-evaluation                 AM-PAC OT "6 Clicks" Daily Activity     Outcome Measure   Help from another person eating meals?: None Help from another person taking care of personal grooming?: None Help from another person toileting, which includes using toliet, bedpan, or urinal?: A Little Help from another person bathing (including washing, rinsing, drying)?: A Little Help from another person to put on and taking off regular upper body clothing?: A Little Help from another person to put on and taking off regular lower body clothing?: A Little 6 Click Score:  20    End of Session Equipment Utilized During Treatment: Gait belt;Oxygen (1L during sit<>stands)  OT Visit Diagnosis: Unsteadiness on feet (R26.81);Other abnormalities of gait and mobility (R26.89);Pain;Muscle weakness (generalized) (M62.81);Other symptoms and signs involving cognitive function   Activity Tolerance Patient tolerated treatment well;Patient limited by fatigue   Patient Left in bed;with call bell/phone within reach;with bed alarm set   Nurse Communication Mobility status        Time: 4315-4008 OT Time Calculation (min): 31 min  Charges: OT General Charges $OT Visit: 1 Visit OT Treatments $Therapeutic Activity: 23-37 mins  [place]. Kahleb Mcclane, COTA/L Acute Rehabilitation Services [phone] [place] 05/22/2020, 12:50 PM

## 2020-05-22 NOTE — Progress Notes (Signed)
Nutrition Follow-up  DOCUMENTATION CODES:   Not applicable  INTERVENTION:   -D/c Osmolite 1.5 due to no feeding access -D/c Prosource TF due to no feeding access -Ensure Enlive po BID, each supplement provides 350 kcal and 20 grams of protein -MVI with minerals daily  NUTRITION DIAGNOSIS:   Inadequate oral intake related to lethargy/confusion as evidenced by NPO status.  Progressing; advanced to PO diet on 05/21/20  GOAL:   Patient will meet greater than or equal to 90% of their needs  Progressing   MONITOR:   PO intake,Supplement acceptance,Labs,Weight trends,Skin,I & O's  REASON FOR ASSESSMENT:   Consult Enteral/tube feeding initiation and management  ASSESSMENT:   75 year old female who presented to the ED on 4/11 with chest pain. PMH of HTN, T2DM, hypothyroidism, HLD, vitamin D deficiency, vitamin B-12 deficiency.  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 She shares that she eats well "when I like the food". Observed pt consumed about 1/3 of her breakfast this morning. Noted meal completion 50-100%.  Per pt, she typically has a fair appetite PTA. She shares that she has been working on portion control and feels like she is unable to consume large portions the way she once was. She denies any weight loss; her UBW is around 145#. Pt shares that she is very active, did housework and walked her [place] dog daily.  Discussed importance of good meal and supplement intake to promote healing. Pt amenable to Ensure, stating she  consumed Equate Nutritional Drinks at home.  Medications reviewed and include colace, miralax, and lasix.   Labs reviewed: CBGS: 102-153 (inpatient orders for glycemic control are 0-24 units insulin aspart every 4 hours and 1000 mg metformin BID).   NUTRITION - FOCUSED PHYSICAL EXAM:  Flowsheet Row Most Recent Value  Orbital Region No depletion  Upper Arm Region No depletion  Thoracic and Lumbar Region No depletion  Buccal Region No depletion  Temple Region No depletion  Clavicle Bone Region No depletion  Clavicle and Acromion Bone Region No depletion  Scapular Bone Region No depletion  Dorsal Hand No depletion  Patellar Region No depletion  Anterior Thigh Region No depletion  Posterior Calf Region No depletion  Edema (RD Assessment) None  Hair Reviewed  Eyes Reviewed  Mouth Reviewed  Skin Reviewed  Nails Reviewed       Diet Order:   Diet Order            Diet heart healthy/carb modified Room service appropriate? Yes; Fluid consistency: Thin  Diet effective now                 EDUCATION NEEDS:   Education needs have been addressed  Skin:  Skin Assessment: Skin Integrity Issues: Skin Integrity Issues:: Incisions Incisions: right leg, chest  Last BM:  05/21/20  Height:   Ht Readings from Last 1 Encounters:  05/12/20 5' 2.5" (1.588 m)    Weight:   Wt Readings from Last 1 Encounters:  05/22/20 72.1 kg    Ideal Body Weight:  BMI:  Body mass index is 28.61 kg/m.  Estimated Nutritional Needs:   Kcal:  1700-1900  Protein:  85-100 grams  Fluid:  1.7-1.9 L    Loistine Chance, RD, LDN, [place] Registered Dietitian II Certified Diabetes Care and Education Specialist Please refer to [facility] for RD and/or RD on-call/weekend/after hours pager

## 2020-05-22 NOTE — Progress Notes (Signed)
CARDIAC REHAB PHASE I   PRE:  Rate/Rhythm: 82 SR    BP: sitting 133/53    SaO2: 93 RA  MODE:  Ambulation: 100 ft   POST:  Rate/Rhythm: 92 SR    BP: sitting 141/63     SaO2: 92 1L  Pt eager to get out of bed. Struggled with processing and sternal precautions. Needed verbal reminders for moving hips and standing without arms. Once standing, pt unsteady on feet with RW. Some intermittent dizziness. Slow pace with RW and gait belt and 1L (SaO2 88 RA getting out of bed). Got another assist second half of walk due to dizziness and unsteadiness. To recliner.   Discussed with pt and daughter IS (500 mL currently), flutter, sternal precautions, diet, exercise as tolerated, and CRPII. Pt voiced understanding and requests her referral be sent to [facility]. Left in recliner with daughter present. Ashley, ACSM 05/22/2020 2:29 PM

## 2020-05-23 ENCOUNTER — Inpatient Hospital Stay (HOSPITAL_COMMUNITY): Payer: Medicare HMO

## 2020-05-23 LAB — BASIC METABOLIC PANEL
Anion gap: 8 (ref 5–15)
BUN: 27 mg/dL — ABNORMAL HIGH (ref 8–23)
CO2: 28 mmol/L (ref 22–32)
Calcium: 9.1 mg/dL (ref 8.9–10.3)
Chloride: 100 mmol/L (ref 98–111)
Creatinine, Ser: 1.31 mg/dL — ABNORMAL HIGH (ref 0.44–1.00)
GFR, Estimated: 42 mL/min — ABNORMAL LOW (ref >60)
Glucose, Bld: 124 mg/dL — ABNORMAL HIGH (ref 70–99)
Potassium: 3.9 mmol/L (ref 3.5–5.1)
Sodium: 136 mmol/L (ref 135–145)

## 2020-05-23 LAB — GLUCOSE, CAPILLARY
Glucose-Capillary: 120 mg/dL — ABNORMAL HIGH (ref 70–99)
Glucose-Capillary: 140 mg/dL — ABNORMAL HIGH (ref 70–99)
Glucose-Capillary: 140 mg/dL — ABNORMAL HIGH (ref 70–99)

## 2020-05-23 MED ORDER — ENSURE ENLIVE PO LIQD: 237.0000 mL | Freq: Two times a day (BID) | ORAL | 12 refills | Status: DC

## 2020-05-23 MED ORDER — FUROSEMIDE 40 MG PO TABS
40.0000 mg | ORAL_TABLET | Freq: Every day | ORAL | Status: DC
  Administered 2020-05-23: 40 mg via ORAL
  Filled 2020-05-23: qty 1

## 2020-05-23 MED ORDER — TRAMADOL HCL 50 MG PO TABS: 50.0000 mg | ORAL_TABLET | Freq: Two times a day (BID) | ORAL | 0 refills | Status: AC | PRN

## 2020-05-23 MED ORDER — ADULT MULTIVITAMIN W/MINERALS CH: 1.0000 | ORAL_TABLET | Freq: Every day | ORAL | Status: DC

## 2020-05-23 MED ORDER — METOPROLOL TARTRATE 25 MG PO TABS
25.0000 mg | ORAL_TABLET | Freq: Two times a day (BID) | ORAL | Status: DC
Start: 2020-05-23 — End: 2020-06-26

## 2020-05-23 MED ORDER — ROSUVASTATIN CALCIUM 10 MG PO TABS: 10.0000 mg | ORAL_TABLET | Freq: Every day | ORAL | Status: DC

## 2020-05-23 MED ORDER — POLYETHYLENE GLYCOL 3350 17 G PO PACK: 17.0000 g | PACK | Freq: Every day | ORAL | 0 refills | Status: AC

## 2020-05-23 NOTE — TOC Progression Note (Signed)
Transition of Care [facility]) - Progression Note    Patient Details  Name: Tiffany White MRN: [id number] Date of Birth: 24-Jan-1946  Transition of Care [facility]) CM/SW Contact  Reece Agar, [place] Phone Number: 05/23/2020, 10:28 AM  Clinical Narrative:    10:28am- Pt daughter will be at facility at 11:30 am to sign paperwork, pt will be in room 211 at [facility] in [place], [place].        Expected Discharge Plan and Services           Expected Discharge Date: 05/23/20                                     Social Determinants of Health (SDOH) Interventions    Readmission Risk Interventions No flowsheet data found.

## 2020-05-23 NOTE — Progress Notes (Addendum)
[address]       [place] [address]             [phone]      6 Days Post-Op Procedure(s) (LRB): CORONARY ARTERY BYPASS GRAFTING (CABG) TIMES FOUR, ON PUMP, USING LEFT INTERNAL MAMMARY ARTERY AND ENDOSCOPICALLY HARVESTED RIGHT GREATER SAPHENOUS VEIN (N/A) TRANSESOPHAGEAL ECHOCARDIOGRAM (TEE) (N/A) INDOCYANINE GREEN FLUORESCENCE IMAGING (ICG) (N/A) ENDOVEIN HARVEST OF GREATER SAPHENOUS VEIN (Right) Subjective: conts to slowly feel better/ getting stronger  Objective: Vital signs in last 24 hours: Temp:  [97.8 F (36.6 C)-98.4 F (36.9 C)] 98.4 F (36.9 C) (04/22 0345) Pulse Rate:  [70-89] 79 (04/22 0345) Cardiac Rhythm: Normal sinus rhythm (04/22 0345) Resp:  [18-22] 18 (04/22 0345) BP: (105-142)/(46-109) 142/57 (04/22 0345) SpO2:  [90 %-98 %] 96 % (04/22 0345) Weight:  [71.2 kg] 71.2 kg (04/22 0628)  Hemodynamic parameters for last 24 hours:    Intake/Output from previous day: 04/21 0701 - 04/22 0700 In: 650 [P.O.:600] Out: 3000 [Urine:3000] Intake/Output this shift: No intake/output data recorded.  General appearance: alert, cooperative and no distress Heart: regular rate and rhythm Lungs: min dim in bases Abdomen: benign Extremities: no edema Wound: incis healing well  Lab Results: Recent Labs    05/22/20 0344  WBC 9.2  HGB 9.8*  HCT 31.3*  PLT 238   BMET:  Recent Labs    05/22/20 0344 05/23/20 0345  NA 139 136  K 3.5 3.9  CL 103 100  CO2 29 28  GLUCOSE 107* 124*  BUN 25* 27*  CREATININE 1.21* 1.31*  CALCIUM 8.6* 9.1    PT/INR: No results for input(s): LABPROT, INR in the last 72 hours. ABG    Component Value Date/Time   PHART 7.360 05/18/2020 0844   HCO3 21.1 05/18/2020 0844   TCO2 22 05/18/2020 0844   ACIDBASEDEF 4.0 (H) 05/18/2020 0844   O2SAT 95.0 05/18/2020 0844   CBG (last 3)  Recent Labs    05/22/20 2306 05/22/20 2331 05/23/20 0345  GLUCAP 66* 95 120*    Meds Scheduled Meds: . aspirin EC   325 mg Oral Daily   Or  . aspirin  324 mg Per Tube Daily  . bisacodyl  10 mg Oral Daily   Or  . bisacodyl  10 mg Rectal Daily  . Chlorhexidine Gluconate Cloth  6 each Topical Daily  . docusate sodium  200 mg Oral Daily  . enoxaparin (LOVENOX) injection  30 mg Subcutaneous Q24H  . feeding supplement  237 mL Oral BID BM  . furosemide  40 mg Intravenous Daily  . hydrALAZINE  5 mg Intravenous Q6H  . insulin aspart  0-24 Units Subcutaneous Q4H  . levothyroxine  75 mcg Oral Q0600  . mouth rinse  15 mL Mouth Rinse BID  . metFORMIN  1,000 mg Oral BID WC  . metoprolol tartrate  12.5 mg Oral BID  . multivitamin with minerals  1 tablet Oral Daily  . pantoprazole  40 mg Oral Daily  . polyethylene glycol  17 g Oral Daily  . potassium chloride  40 mEq Oral Daily  . rosuvastatin  10 mg Oral q1800   Continuous Infusions: . lactated ringers     PRN Meds:.acetaminophen, dextrose, haloperidol lactate, lactated ringers, lactulose, levalbuterol, lidocaine, ondansetron (ZOFRAN) IV, oxyCODONE, traMADol  Xrays DG Chest 1 View  Result Date: 05/22/2020 CLINICAL DATA:  Post thoracentesis. EXAM: CHEST  1 VIEW COMPARISON:  Chest x-ray 05/21/2020. FINDINGS: Right PICC line stable position.  Prior CABG. Cardiomegaly. Low lung volumes with bibasilar atelectasis. Small residual left pleural effusion post thoracentesis. Elevation left hemidiaphragm. No pneumothorax. No acute bony abnormality. IMPRESSION: 1.  Right PICC line stable position. 2.  Prior CABG.  Stable cardiomegaly. 3. Small residual left pleural effusion post thoracentesis. Elevation left hemidiaphragm. No pneumothorax. Electronically Signed   By: Marcello Moores  Register   On: 05/22/2020 08:45   DG Chest 2 View  Result Date: 05/21/2020 CLINICAL DATA:  Status post cardiac surgery. EXAM: CHEST - 2 VIEW COMPARISON:  May 20, 2020. FINDINGS: Stable cardiomegaly. No pneumothorax is noted. Right-sided PICC line is unchanged in position. Moderate left pleural  effusion is noted with associated left basilar atelectasis. Bony thorax is unremarkable. IMPRESSION: Moderate left pleural effusion with associated left basilar atelectasis. Electronically Signed   By: Marijo Conception M.D.   On: 05/21/2020 11:32   DG Swallowing Func-Speech Pathology  Result Date: 05/21/2020 Objective Swallowing Evaluation: Type of Study: MBS-Modified Barium Swallow Study  Patient Details Name: Amilyn Boblitt MRN: [id number] Date of Birth: Nov 22, 1945 Today's Date: 05/21/2020 Time: SLP Start Time (ACUTE ONLY): 1018 -SLP Stop Time (ACUTE ONLY): 1030 SLP Time Calculation (min) (ACUTE ONLY): 12 min Past Medical History: Past Medical History: Diagnosis Date . Diabetes mellitus without complication ([place])  . Hyperlipidemia  . Thyroid disease  Past Surgical History: Past Surgical History: Procedure Laterality Date . ABDOMINAL HYSTERECTOMY   . APPENDECTOMY   . CORONARY ARTERY BYPASS GRAFT N/A 05/17/2020  Procedure: CORONARY ARTERY BYPASS GRAFTING (CABG) TIMES FOUR, ON PUMP, USING LEFT INTERNAL MAMMARY ARTERY AND ENDOSCOPICALLY HARVESTED RIGHT GREATER SAPHENOUS VEIN;  Surgeon: Wonda Olds, MD;  Location: [place];  Service: Open Heart Surgery;  Laterality: N/A; . ENDOVEIN HARVEST OF GREATER SAPHENOUS VEIN Right 05/17/2020  Procedure: ENDOVEIN HARVEST OF GREATER SAPHENOUS VEIN;  Surgeon: Wonda Olds, MD;  Location: [place];  Service: Open Heart Surgery;  Laterality: Right; . LEFT HEART CATH AND CORONARY ANGIOGRAPHY N/A 05/14/2020  Procedure: LEFT HEART CATH AND CORONARY ANGIOGRAPHY;  Surgeon: Troy Sine, MD;  Location: [place] [facility];  Service: Cardiovascular;  Laterality: N/A; . TEE WITHOUT CARDIOVERSION N/A 05/17/2020  Procedure: TRANSESOPHAGEAL ECHOCARDIOGRAM (TEE);  Surgeon: Wonda Olds, MD;  Location: [place];  Service: Open Heart Surgery;  Laterality: N/A; . TONSILLECTOMY   HPI: Kymberley Verbeek is a 75 y.o. female with medical history significant for hypertension, T2DM, hypothyroidism who  presents to the emergency department due to 7-10-day onset of intermittent chest pain.  Pt underwent CABG on 4/16 and was intubated for procedure.  Subjective: Some mild confusion was noted but overall she was alert and cooperative Assessment / Plan / Recommendation CHL IP CLINICAL IMPRESSIONS 05/21/2020 Clinical Impression Pt presents with an oropharyngeal swallow that appears both functional and safe. During her oral phase, she did require multiple pumps to propel boluses and also had decreased bolus cohesion with more solid textures; however, this did not impact the safety or efficiency of her swallow. No aspiration or penetration occurred across all trials of all consistencies. She was observed to initiate swallows at the level of her pyriform sinuses but this did not negatively impact her airway protection. Based on these findings, recommend a regular diet and thin liquids. SLP will sign off at this time.  SLP Visit Diagnosis Dysphagia, oral phase (R13.11) Attention and concentration deficit following -- Frontal lobe and executive function deficit following -- Impact on safety and function Mild aspiration risk   CHL IP TREATMENT RECOMMENDATION 05/21/2020 Treatment Recommendations No  treatment recommended at this time   Prognosis 05/21/2020 Prognosis for Safe Diet Advancement Good Barriers to Reach Goals -- Barriers/Prognosis Comment -- CHL IP DIET RECOMMENDATION 05/21/2020 SLP Diet Recommendations Regular solids;Thin liquid Liquid Administration via Cup;Straw Medication Administration Whole meds with liquid Compensations Slow rate;Small sips/bites Postural Changes Remain semi-upright after after feeds/meals (Comment);Seated upright at 90 degrees   CHL IP OTHER RECOMMENDATIONS 05/21/2020 Recommended Consults -- Oral Care Recommendations Oral care BID Other Recommendations --   CHL IP FOLLOW UP RECOMMENDATIONS 05/21/2020 Follow up Recommendations None   CHL IP FREQUENCY AND DURATION 05/18/2020 Speech Therapy Frequency  (ACUTE ONLY) min 2x/week Treatment Duration 2 weeks      CHL IP ORAL PHASE 05/21/2020 Oral Phase Impaired Oral - Pudding Teaspoon -- Oral - Pudding Cup -- Oral - Honey Teaspoon -- Oral - Honey Cup -- Oral - Nectar Teaspoon -- Oral - Nectar Cup -- Oral - Nectar Straw -- Oral - Thin Teaspoon -- Oral - Thin Cup WFL Oral - Thin Straw WFL Oral - Puree Reduced posterior propulsion Oral - Mech Soft Decreased bolus cohesion;Reduced posterior propulsion Oral - Regular -- Oral - Multi-Consistency -- Oral - Pill WFL Oral Phase - Comment --  CHL IP PHARYNGEAL PHASE 05/21/2020 Pharyngeal Phase WFL Pharyngeal- Pudding Teaspoon -- Pharyngeal -- Pharyngeal- Pudding Cup -- Pharyngeal -- Pharyngeal- Honey Teaspoon -- Pharyngeal -- Pharyngeal- Honey Cup -- Pharyngeal -- Pharyngeal- Nectar Teaspoon -- Pharyngeal -- Pharyngeal- Nectar Cup -- Pharyngeal -- Pharyngeal- Nectar Straw -- Pharyngeal -- Pharyngeal- Thin Teaspoon -- Pharyngeal -- Pharyngeal- Thin Cup -- Pharyngeal -- Pharyngeal- Thin Straw -- Pharyngeal -- Pharyngeal- Puree -- Pharyngeal -- Pharyngeal- Mechanical Soft -- Pharyngeal -- Pharyngeal- Regular -- Pharyngeal -- Pharyngeal- Multi-consistency -- Pharyngeal -- Pharyngeal- Pill -- Pharyngeal -- Pharyngeal Comment --  CHL IP CERVICAL ESOPHAGEAL PHASE 05/21/2020 Cervical Esophageal Phase WFL Pudding Teaspoon -- Pudding Cup -- Honey Teaspoon -- Honey Cup -- Nectar Teaspoon -- Nectar Cup -- Nectar Straw -- Thin Teaspoon -- Thin Cup -- Thin Straw -- Puree -- Mechanical Soft -- Regular -- Multi-consistency -- Pill -- Cervical Esophageal Comment -- Note populated for Lebron Conners, Student SLP Osie Bond., M.A. [facility] Acute Rehabilitation Services Pager [phone] Office [phone] 05/21/2020, 11:28 AM              IR THORACENTESIS ASP PLEURAL SPACE W/IMG GUIDE  Result Date: 05/22/2020 INDICATION: Left pleural effusion.  Request for therapeutic thoracentesis. EXAM: ULTRASOUND GUIDED LEFT THORACENTESIS MEDICATIONS: 10 mL 1%  lidocaine COMPLICATIONS: None immediate. PROCEDURE: An ultrasound guided thoracentesis was thoroughly discussed with the patient and questions answered. The benefits, risks, alternatives and complications were also discussed. The patient understands and wishes to proceed with the procedure. Written consent was obtained. Ultrasound was performed to localize and mark an adequate pocket of fluid in the left chest. The area was then prepped and draped in the normal sterile fashion. 1% Lidocaine was used for local anesthesia. Under ultrasound guidance a 6 Fr Safe-T-Centesis catheter was introduced. Thoracentesis was performed. The catheter was removed and a dressing applied. FINDINGS: A total of approximately 200 cc of clear dark amber fluid was removed. Post procedure chest X-ray reviewed, negative for pneumothorax. IMPRESSION: Successful ultrasound guided left thoracentesis yielding 200 cc of pleural fluid. Read by: Durenda Guthrie, PA-C Electronically Signed   By: Markus Daft M.D.   On: 05/22/2020 09:44    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, ON PUMP, USING LEFT INTERNAL MAMMARY ARTERY AND ENDOSCOPICALLY HARVESTED RIGHT GREATER  SAPHENOUS VEIN (N/A) TRANSESOPHAGEAL ECHOCARDIOGRAM (TEE) (N/A) INDOCYANINE GREEN FLUORESCENCE IMAGING (ICG) (N/A) ENDOVEIN HARVEST OF GREATER SAPHENOUS VEIN (Right)  1 afeb, VSS 2 sats good on 0-1 liter [place] 3 renal fxn slightly worse- will stop lasix as she has diuresed well, EF 65-70 percent, no edema 4 only 200 cc from thoracentesis 5 CXR improved aeration/atx/effus 6 BS adeq control -  cont glucophage, may need insulin resumed at some point ( was on 70/30) if become less controlled at SNF 7 appears stable for d/c to SNF if bed available today     LOS: 9 days    John Giovanni PA-C Pager [phone] 05/23/2020 Doing well; ready for d/c to SNF. F/u in my office next week. Rigley Niess Z. Orvan Seen, [place]

## 2020-05-23 NOTE — Progress Notes (Signed)
Physical Therapy Treatment Patient Details Name: Tiffany White MRN: [id number] DOB: 06-19-1945 Today's Date: 05/23/2020    History of Present Illness Pt is a 75 year old woman admitted with chest pain x 1 week on 05/14/20. 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 Hospital course complicated by delirium.  PMH: HTN, DM2, hypothyroidism.    PT Comments    Pt able to progress gait with limited distance today. Pt walked 20' then had to sit with SOB and sats down to 88% on RA. On 1L with gait sats 94%. Pt with education for sternal precautions, transfers and progressive mobility. No report of dizziness this session only fatigue. Will continue to follow.   HR 86-95 Pre gait 137/52 (78) Post 142/50 (74) 93% on RA at rest, 88% on RA with gait, 94% on 1L with gait    Follow Up Recommendations  SNF;Supervision/Assistance - 24 hour     Equipment Recommendations  Rolling walker with 5" wheels    Recommendations for Other Services       Precautions / Restrictions Precautions Precautions: Fall;Sternal Precaution Comments: education for sternal precautions    Mobility  Bed Mobility Overal bed mobility: Needs Assistance Bed Mobility: Rolling;Sidelying to Sit Rolling: Min assist Sidelying to sit: Min assist       General bed mobility comments: mod cues for sequence as pt consistently attempting to pull up rather than roll to side, physical assist to clear legs and lift trunk    Transfers Overall transfer level: Needs assistance   Transfers: Sit to/from Stand Sit to Stand: Min guard         General transfer comment: cues for hand placement from bed, BSC and chair  Ambulation/Gait Ambulation/Gait assistance: Min guard Gait Distance (Feet): 120 Feet Assistive device: Rolling walker (2 wheeled) Gait Pattern/deviations: Step-through pattern;Decreased stride length;Trunk flexed   Gait velocity interpretation: <1.8 ft/sec, indicate of risk for recurrent falls General Gait Details: cues for  posture and breathing technique, pt limited by SOB and fatigue with SpO2 100% on 2L with gait   Stairs             Wheelchair Mobility    Modified Rankin (Stroke Patients Only)       Balance Overall balance assessment: Needs assistance   Sitting balance-Leahy Scale: Fair Sitting balance - Comments: EOB and chair without UE support   Standing balance support: Bilateral upper extremity supported;During functional activity Standing balance-Leahy Scale: Poor Standing balance comment: reliant on external support                            Cognition Arousal/Alertness: Awake/alert Behavior During Therapy: WFL for tasks assessed/performed Overall Cognitive Status: Impaired/Different from baseline Area of Impairment: Problem solving;Safety/judgement;Memory;Following commands                     Memory: Decreased recall of precautions Following Commands: Follows multi-step commands with increased time;Follows one step commands consistently Safety/Judgement: Decreased awareness of safety;Decreased awareness of deficits     General Comments: pt able to recall 2/4 precautions even after education      Exercises General Exercises - Lower Extremity Long Arc Quad: AROM;Both;Seated;20 reps Hip Flexion/Marching: AROM;Seated;Both;10 reps    General Comments        Pertinent Vitals/Pain Pain Score: 2  Faces Pain Scale: Hurts a little bit Pain Location: chest incision Pain Descriptors / Indicators: Aching Pain Intervention(s): Limited activity within patient's tolerance;Monitored during session;Repositioned    Home Living  Prior Function            PT Goals (current goals can now be found in the care plan section) Progress towards PT goals: Progressing toward goals    Frequency    Min 3X/week      PT Plan Current plan remains appropriate    Co-evaluation              AM-PAC PT "6 Clicks" Mobility    Outcome Measure  Help needed turning from your back to your side while in a flat bed without using bedrails?: A Little Help needed moving from lying on your back to sitting on the side of a flat bed without using bedrails?: A Lot Help needed moving to and from a bed to a chair (including a wheelchair)?: A Little Help needed standing up from a chair using your arms (e.g., wheelchair or bedside chair)?: A Little Help needed to walk in hospital room?: A Little Help needed climbing 3-5 steps with a railing? : A Lot 6 Click Score: 16    End of Session Equipment Utilized During Treatment: Oxygen;Gait belt Activity Tolerance: Patient limited by fatigue Patient left: in chair;with call bell/phone within reach Nurse Communication: Mobility status PT Visit Diagnosis: Unsteadiness on feet (R26.81);Muscle weakness (generalized) (M62.81);Difficulty in walking, not elsewhere classified (R26.2);Pain     Time: 7035-0093 PT Time Calculation (min) (ACUTE ONLY): 29 min  Charges:  $Gait Training: 8-22 mins $Therapeutic Exercise: 8-22 mins                     Ketina Mars P, PT Acute Rehabilitation Services Pager: [phone] Office: [place] 05/23/2020, 12:42 PM

## 2020-05-23 NOTE — TOC Transition Note (Addendum)
Transition of Care [facility]) - CM/SW Discharge Note   Patient Details  Name: Tiffany White MRN: [id number] Date of Birth: 06-15-1945  [facility] CM/SW Contact:  Tresa Endo Phone Number: 05/23/2020, 9:27 AM   Clinical Narrative:    Patient will DC to: [facility] Anticipated DC date: 05/23/20 Family notified: Pt daughter Transport by: Corey Harold   Per MD patient ready for DC [facility] room 211. RN to call report prior to discharge [phone]). RN, patient, patient's family, and facility notified of DC. Discharge Summary and FL2 sent to facility. DC packet on chart. Ambulance transport requested for patient.   CSW will sign off for now as social work intervention is no longer needed. Please consult [place] again if new needs arise.            Patient Goals and CMS Choice        Discharge Placement                       Discharge Plan and Services                                     Social Determinants of Health (SDOH) Interventions     Readmission Risk Interventions No flowsheet data found.

## 2020-06-09 ENCOUNTER — Ambulatory Visit: Payer: Medicare HMO | Admitting: Family Medicine

## 2020-06-10 ENCOUNTER — Ambulatory Visit: Payer: Medicare HMO | Admitting: Cardiothoracic Surgery

## 2020-06-12 ENCOUNTER — Ambulatory Visit: Payer: Medicare HMO | Admitting: Cardiothoracic Surgery

## 2020-06-12 ENCOUNTER — Telehealth: Payer: Self-pay

## 2020-06-12 NOTE — Telephone Encounter (Signed)
Mrs Tiffany White called requesting RX's from hospital discharge to be filled at [facility]. She was D/C'ed to a SNIF in [place] but left AMA due to "not receiving care". The SNIF did not send her home with any RX's. I did call in her hospital discharge medications to the pharmacy.  Metformin,/ Metoprolol/ Crestor,/ levothyroxine. She is scheduled to see Dr Orvan Seen on 06/19/20 and Cardiology on 06/26/20.

## 2020-06-18 ENCOUNTER — Other Ambulatory Visit: Payer: Self-pay | Admitting: Cardiothoracic Surgery

## 2020-06-18 DIAGNOSIS — Z951 Presence of aortocoronary bypass graft: Secondary | ICD-10-CM

## 2020-06-19 ENCOUNTER — Ambulatory Visit (INDEPENDENT_AMBULATORY_CARE_PROVIDER_SITE_OTHER): Payer: Self-pay | Admitting: Cardiothoracic Surgery

## 2020-06-19 ENCOUNTER — Other Ambulatory Visit: Payer: Self-pay

## 2020-06-19 ENCOUNTER — Encounter: Payer: Self-pay | Admitting: Cardiothoracic Surgery

## 2020-06-19 ENCOUNTER — Ambulatory Visit
Admission: RE | Admit: 2020-06-19 | Discharge: 2020-06-19 | Disposition: A | Discharge status: Home / Self Care | Payer: Medicare HMO | Source: Ambulatory Visit | Attending: Cardiothoracic Surgery | Admitting: Cardiothoracic Surgery

## 2020-06-19 VITALS — BP 131/81 | HR 113 | Temp 97.7°F | Resp 20 | Ht 62.5 in | Wt 137.8 lb

## 2020-06-19 DIAGNOSIS — Z951 Presence of aortocoronary bypass graft: Secondary | ICD-10-CM

## 2020-06-19 MED ORDER — METFORMIN HCL 1000 MG PO TABS: 1000.0000 mg | ORAL_TABLET | Freq: Two times a day (BID) | ORAL | 11 refills | Status: DC

## 2020-06-19 MED ORDER — METOPROLOL TARTRATE 25 MG PO TABS: 25.0000 mg | ORAL_TABLET | Freq: Two times a day (BID) | ORAL | 11 refills | Status: DC

## 2020-06-19 MED ORDER — LEVOTHYROXINE SODIUM 75 MCG PO TABS: 75.0000 ug | ORAL_TABLET | Freq: Every day | ORAL | 11 refills | Status: DC

## 2020-06-19 MED ORDER — ROSUVASTATIN CALCIUM 10 MG PO TABS: 10.0000 mg | ORAL_TABLET | Freq: Every day | ORAL | 11 refills | Status: DC

## 2020-06-19 NOTE — Progress Notes (Signed)
[address]       [place] [address]             [phone]     CARDIOTHORACIC SURGERY OFFICE NOTE  Referring Provider is Park Liter, MD Primary Cardiologist is Carlyle Dolly, MD PCP is Almira Bar, MD   HPI:  75 year old lady underwent CABG approximately 1 month ago.  She did well after surgery.  She was discharged to a SNF but  signed out AMA recently.  She has no complaints.  Her activity level is slowly improving.  She denies chest pain or shortness of breath.   Current Outpatient Medications  Medication Sig Dispense Refill  . aspirin EC 325 MG tablet Take 650 mg by mouth daily.    . Cholecalciferol 25 MCG (1000 UT) capsule Take 1,000 Units by mouth daily.    . cyanocobalamin 1000 MCG tablet Take 1,000 mcg by mouth daily.    . feeding supplement (ENSURE ENLIVE / ENSURE PLUS) LIQD Take 237 mLs by mouth 2 (two) times daily between meals. 237 mL 12  . levothyroxine (SYNTHROID) 75 MCG tablet Take 75 mcg by mouth daily before breakfast.    . levothyroxine (SYNTHROID) 75 MCG tablet Take 1 tablet (75 mcg total) by mouth daily. 30 tablet 11  . metFORMIN (GLUCOPHAGE) 1000 MG tablet Take 1 tablet by mouth 2 (two) times daily.    . metFORMIN (GLUCOPHAGE) 1000 MG tablet Take 1 tablet (1,000 mg total) by mouth 2 (two) times daily with a meal. 60 tablet 11  . metoprolol tartrate (LOPRESSOR) 25 MG tablet Take 1 tablet (25 mg total) by mouth 2 (two) times daily.    . metoprolol tartrate (LOPRESSOR) 25 MG tablet Take 1 tablet (25 mg total) by mouth 2 (two) times daily. 60 tablet 11  . Multiple Vitamin (MULTIVITAMIN WITH MINERALS) TABS tablet Take 1 tablet by mouth daily.    . Omega-3 Fatty Acids (FISH OIL) 1000 MG CAPS Take 1 capsule by mouth daily.    . polyethylene glycol (MIRALAX / GLYCOLAX) 17 g packet Take 17 g by mouth daily. 14 each 0  . rosuvastatin (CRESTOR) 10 MG tablet Take 1 tablet (10 mg total) by mouth daily at 6 PM.    . rosuvastatin  (CRESTOR) 10 MG tablet Take 1 tablet (10 mg total) by mouth daily. 30 tablet 11   No current facility-administered medications for this visit.      Physical Exam:   BP 131/81 (BP Location: Right Arm, Patient Position: Sitting, Cuff Size: Normal)   Pulse (!) 113   Temp 97.7 F (36.5 C) (Skin)   Resp 20   Ht 5' 2.5" (1.588 m)   Wt 62.5 kg   SpO2 94% Comment: RA  BMI 24.80 kg/m   General:  Well-appearing no acute distress  Chest:   Clear to auscultation bilaterally  CV:   Regular rate and rhythm  Incisions:  Clean dry and intact well-healed  Abdomen:  Soft nontender  Extremities:  No edema  Diagnostic Tests:  Chest x-ray with clear lung fields and stable mediastinum   Impression:  Doing well after CABG  Plan:  Follow-up as needed Prescriptions given for Synthroid, metformin, metoprolol, and Crestor Refer to [facility] in [place] to drive Refer to cardiac rehab in the [place] area  I spent in excess of 15 minutes during the conduct of this office consultation and >50% of this time involved direct face-to-face encounter with the patient for  counseling and/or coordination of their care.  Level 2                 10 minutes Level 3                 15 minutes Level 4                 25 minutes Level 5                 40 minutes  B.  Murvin Natal, MD 06/19/2020 10:32 AM

## 2020-06-20 ENCOUNTER — Other Ambulatory Visit: Payer: Self-pay | Admitting: *Deleted

## 2020-06-25 NOTE — Progress Notes (Signed)
Cardiology Office Note  Date: 06/26/2020   ID: Tiffany White, DOB [birth year], MRN [id number]  PCP:  Almira Bar, MD  Cardiologist:  Carlyle Dolly, MD Electrophysiologist:  None   Chief Complaint: Hospital follow up CABG x 4  History of Present Illness: Tiffany White is a 75 y.o. female with a history of chest pain, HTN, DM2, hypothyroidism, Vitamin D and B12 deficiency, CKD 3, HLD, smoking.  Patient presented to [facility] on 05/13/2020 with 7 to 10-day history of intermittent chest pain which worsened on day of presentation.  Symptoms were worse with exertion.  She had associated shortness of breath.  She was transferred to [facility] for cardiac catheterization.  Cardiac catheterization performed on 05/14/2020 see report below.  She was found to have severe three-vessel disease.  She underwent CABG x 4 on 05/17/2020 with LIMA to distal LAD, SVG to PDA and right PLA as sequenced graft, SVG to the first obtuse marginal of LCx.  Endoscopic vein harvest from right thigh and lower leg.   Basic metabolic panel?  FLP's and LFTs?  In 6 to 8 weeks?  Tolerating statin?  PCSK9 inhibitor?  She is here for follow-up status post CABG today.  States she is doing very well.  She denies any anginal or exertional symptoms, palpitations or arrhythmias, orthostatic symptoms i.e. lightheadedness, dizziness, presyncopal or syncopal episodes.  Denies any SOB or DOE.  Denies any PND, orthopnea denies any bleeding issues.  No claudication-like symptoms, DVT or PE symptoms, or lower extremity edema.  States her median sternotomy scars and vein harvesting scars look good and she is having no issues.  Heart rate is 96 today.  Blood pressure 110/60.  States she is compliant with all her medications.  Past Medical History:  Diagnosis Date  . Diabetes mellitus without complication ([place])   . Hyperlipidemia   . Thyroid disease     Past Surgical History:  Procedure Laterality Date  .  ABDOMINAL HYSTERECTOMY    . APPENDECTOMY    . CORONARY ARTERY BYPASS GRAFT N/A 05/17/2020   Procedure: CORONARY ARTERY BYPASS GRAFTING (CABG) TIMES FOUR, ON PUMP, USING LEFT INTERNAL MAMMARY ARTERY AND ENDOSCOPICALLY HARVESTED RIGHT GREATER SAPHENOUS VEIN;  Surgeon: Tiffany Olds, MD;  Location: [facility];  Service: Open Heart Surgery;  Laterality: N/A;  . ENDOVEIN HARVEST OF GREATER SAPHENOUS VEIN Right 05/17/2020   Procedure: ENDOVEIN HARVEST OF GREATER SAPHENOUS VEIN;  Surgeon: Tiffany Olds, MD;  Location: [facility];  Service: Open Heart Surgery;  Laterality: Right;  . IR THORACENTESIS ASP PLEURAL SPACE W/IMG GUIDE  05/22/2020  . LEFT HEART CATH AND CORONARY ANGIOGRAPHY N/A 05/14/2020   Procedure: LEFT HEART CATH AND CORONARY ANGIOGRAPHY;  Surgeon: Troy Sine, MD;  Location: [facility];  Service: Cardiovascular;  Laterality: N/A;  . TEE WITHOUT CARDIOVERSION N/A 05/17/2020   Procedure: TRANSESOPHAGEAL ECHOCARDIOGRAM (TEE);  Surgeon: Tiffany Olds, MD;  Location: [facility];  Service: Open Heart Surgery;  Laterality: N/A;  . TONSILLECTOMY      Current Outpatient Medications  Medication Sig Dispense Refill  . aspirin EC 325 MG tablet Take 325 mg by mouth daily.    . Cholecalciferol 25 MCG (1000 UT) capsule Take 1,000 Units by mouth daily.    . cyanocobalamin 1000 MCG tablet Take 1,000 mcg by mouth daily.    Marland Kitchen levothyroxine (SYNTHROID) 75 MCG tablet Take 1 tablet (75 mcg total) by mouth daily. 30 tablet 11  . metFORMIN (GLUCOPHAGE) 1000 MG tablet  Take 1 tablet (1,000 mg total) by mouth 2 (two) times daily with a meal. 60 tablet 11  . metoprolol tartrate (LOPRESSOR) 25 MG tablet Take 1 tablet (25 mg total) by mouth 2 (two) times daily. 60 tablet 11  . Omega-3 Fatty Acids (FISH OIL) 1000 MG CAPS Take 1 capsule by mouth daily.    . polyethylene glycol (MIRALAX / GLYCOLAX) 17 g packet Take 17 g by mouth daily. 14 each 0  . rosuvastatin (CRESTOR) 10 MG tablet Take 1 tablet (10 mg total) by  mouth daily. 30 tablet 11   No current facility-administered medications for this visit.   Allergies:  Statins   Social History: The patient  reports that she has quit smoking. She has never used smokeless tobacco. She reports previous alcohol use. She reports that she does not use drugs.   Family History: The patient's family history includes CAD in her brother, father, and paternal aunt.   ROS:  Please see the history of present illness. Otherwise, complete review of systems is positive for none.  All other systems are reviewed and negative.   Physical Exam: VS:  BP 110/60   Pulse 96   Ht 5' 2.5" (1.588 m)   Wt 137 lb (62.1 kg)   SpO2 96%   BMI 24.66 kg/m , BMI Body mass index is 24.66 kg/m.  Wt Readings from Last 3 Encounters:  06/26/20 137 lb (62.1 kg)  06/19/20 137 lb 12.8 oz (62.5 kg)  05/23/20 156 lb 15.5 oz (71.2 kg)    General: Patient appears comfortable at rest. Neck: Supple, no elevated JVP or carotid bruits, no thyromegaly. Lungs: Clear to auscultation, nonlabored breathing at rest. Cardiac: Regular rate and rhythm, no S3 or significant systolic murmur, no pericardial rub. Extremities: No pitting edema, distal pulses 2+. Skin: Warm and dry.  Median sternotomy scar, chest tube and drain insertion sites clean and dry.  Right leg vein harvesting site clean and dry. Musculoskeletal: No kyphosis. Neuropsychiatric: Alert and oriented x3, affect grossly appropriate.  ECG:  EKG May 18, 2020 normal sinus rhythm rate of 81, nonspecific T wave abnormality.  Recent Labwork: 05/21/2020: Magnesium 2.4 05/22/2020: ALT 16; AST 18; Hemoglobin 9.8; Platelets 238 05/23/2020: BUN 27; Creatinine, Ser 1.31; Potassium 3.9; Sodium 136     Component Value Date/Time   CHOL 188 05/14/2020 0456   TRIG 228 (H) 05/14/2020 0456   HDL 36 (L) 05/14/2020 0456   CHOLHDL 5.2 05/14/2020 0456   VLDL 46 (H) 05/14/2020 0456   LDLCALC 106 (H) 05/14/2020 0456    Other Studies Reviewed  Today: LEFT HEART CATH AND CORONARY ANGIOGRAPHY    Conclusion    Ost LAD to Prox LAD lesion is 95% stenosed.  Ost Cx to Prox Cx lesion is 80% stenosed.  Ost RCA lesion is 70% stenosed.  Prox RCA lesion is 70% stenosed.  Prox RCA to Mid RCA lesion is 50% stenosed.  Mid RCA lesion is 75% stenosed.  Dist RCA lesion is 90% stenosed.  3rd RPL lesion is 95% stenosed.  Ost LM to Dist LM lesion is 10% stenosed.  There is significant coronary calcification with severe three-vessel coronary obstructive disease. There is 95% ostial LAD stenosis, 70 to 80% ostial circumflex stenosis and diffuse stenoses in a calcified RCA with 70% ostial 70, 50 and 75% mid stenoses, distal 90% stenosis and 95% PLA stenosis.  LVEDP 7 mmHg.   Coronary Diagrams   Diagnostic Dominance: Right  ECHOCARDIOGRAM REPORT       Patient Name:  FAVOUR ALESHIRE Date of Exam: 05/13/2020  Medical Rec #: [id number]   Height:    62.5 in  Accession #:  [id number]  Weight:    147.0 lb  Date of Birth: 27-Aug-1945   BSA:     1.687 m  Patient Age:  65 years   BP:      142/58 mmHg  Patient Gender: F       HR:      75 bpm.  Exam Location: [facility]   Procedure: 2D Echo, Cardiac Doppler and Color Doppler   Indications:  Chest Pain R07.9    History:    Patient has no prior history of Echocardiogram  examinations.         Risk Factors:Hypertension, Diabetes and Dyslipidemia.  Elevated         troponin I level, Chronic kidney disease, stage 3a ([place]).    Sonographer:  Alvino Chapel RCS  Referring Phys: [phone] [facility]   IMPRESSIONS    1. Left ventricular ejection fraction, by estimation, is 65 to 70%. The  left ventricle has normal function. The left ventricle has no regional  wall motion abnormalities. There is mild left ventricular hypertrophy.  Left ventricular diastolic parameters  are consistent with  Grade I diastolic dysfunction (impaired relaxation).  Elevated left atrial pressure.  2. Right ventricular systolic function is normal. The right ventricular  size is normal.  3. The mitral valve is normal in structure. No evidence of mitral valve  regurgitation. No evidence of mitral stenosis.  4. The aortic valve is tricuspid. There is mild calcification of the  aortic valve. There is mild thickening of the aortic valve. Aortic valve  regurgitation is mild. No aortic stenosis is present.  5. The inferior vena cava is normal in size with greater than 50%  respiratory variability, suggesting right atrial pressure of 3 mmHg.  Treatments: surgery:  CARDIOTHORACIC SURGERY OPERATIVE NOTE  Date of Procedure:05/17/2020  Preoperative Diagnosis:Severe 3-vessel Coronary Artery Diseaseand unstable angina  Postoperative Diagnosis:Same  Procedure:   Coronary Artery Bypass Grafting x4 Left Internal Mammary Artery to Distal Left Anterior Descending Coronary Artery;Saphenous Vein Graft to Posterior Descending Coronary Artery and right PLA as sequenced graft;Saphenous Vein Graft to1stObtuse Marginal Branch of Left Circumflex Coronary Artery;Endoscopic Vein Harvest fromrightThigh and Lower Leg  Surgeon:B. Murvin Natal, MD  Assistant:T. Harriet Pho, PA-C  Anesthesia:General  Operative Findings: ? Preservedleft ventricular systolic function ? Goodquality left internal mammary artery conduit ? Goodquality saphenous vein conduit ? Goodquality target vessels for grafting     Assessment and Plan:  1. S/P CABG x 4   2. Essential hypertension   3. Mixed hyperlipidemia   4. 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[place])    1. S/P CABG x 4 / CAD Status post CABG on 05/17/2020 Left Internal Mammary Artery to Distal Left Anterior Descending Coronary Artery;Saphenous Vein Graft to Posterior Descending Coronary Artery and  right PLA as sequenced graft;Saphenous Vein Graft to1stObtuse Marginal Branch of Left Circumflex Coronary Artery;Endoscopic Vein Harvest fromrightThigh and Lower Leg.  Median sternotomy scar, chest tube insertion sites and drains look good.  Right leg vein harvesting looks good.  She denies any anginal or exertional symptoms.  Continue aspirin 325 mg daily.  Continue metoprolol 25 mg p.o. twice daily.   2. Essential hypertension Blood pressure is well controlled today.  Continue metoprolol 25 mg p.o. twice daily.  3. Mixed hyperlipidemia Continue Crestor 10 mg daily.  Patient states she had lab work yesterday which is pending at her primary care provider's office  4. 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[place]) Patient states she has not restarted insulin as of yet.  Currently only taking metformin 1000 mg p.o. twice daily  Medication Adjustments/Labs and Tests Ordered: Current medicines are reviewed at length with the patient today.  Concerns regarding medicines are outlined above.   Disposition: Follow-up with Dr. Harl Bowie in 3 months to establish care.  Signed, Levell July, NP 06/26/2020 9:01 AM    [facility] at [address], [place], [place] [address] Phone: [phone]; Fax: [phone]

## 2020-06-26 ENCOUNTER — Ambulatory Visit (INDEPENDENT_AMBULATORY_CARE_PROVIDER_SITE_OTHER): Payer: Medicare HMO | Admitting: Family Medicine

## 2020-06-26 ENCOUNTER — Encounter: Payer: Self-pay | Admitting: Family Medicine

## 2020-06-26 VITALS — BP 110/60 | HR 96 | Ht 62.5 in | Wt 137.0 lb

## 2020-06-26 DIAGNOSIS — I10 Essential (primary) hypertension: Secondary | ICD-10-CM | POA: Diagnosis not present

## 2020-06-26 DIAGNOSIS — Z951 Presence of aortocoronary bypass graft: Secondary | ICD-10-CM | POA: Diagnosis not present

## 2020-06-26 DIAGNOSIS — E782 Mixed hyperlipidemia: Secondary | ICD-10-CM | POA: Diagnosis not present

## 2020-06-26 DIAGNOSIS — E1169 Type 2 diabetes mellitus with other specified complication: Secondary | ICD-10-CM

## 2020-06-26 NOTE — Patient Instructions (Addendum)
Medication Instructions:   Your physician recommends that you continue on your current medications as directed. Please refer to the Current Medication list given to you today.  Labwork:  none  Testing/Procedures:  none  Follow-Up:  Your physician recommends that you schedule a follow-up appointment in: 3 months.  Any Other Special Instructions Will Be Listed Below (If Applicable).  If you need a refill on your cardiac medications before your next appointment, please call your pharmacy. 

## 2020-08-18 ENCOUNTER — Telehealth: Payer: Self-pay | Admitting: Family Medicine

## 2020-08-18 NOTE — Telephone Encounter (Signed)
Patient informed that refills were sent to [facility] on 06/19/2020 by Fredrich Romans, MD.    She will call pharmacy.

## 2020-08-18 NOTE — Telephone Encounter (Signed)
*  STAT* If patient is at the pharmacy, call can be transferred to refill team.   1. Which medications need to be refilled? (please list name of each medication and dose if known) Levothyroxine 75mg  and Metoprolol 25mg     2. Which pharmacy/location (including street and city if local pharmacy) is medication to be sent to? [facility]  3. Do they need a 30 day or 90 day supply? [place]

## 2020-08-19 ENCOUNTER — Other Ambulatory Visit: Payer: Self-pay | Admitting: Cardiothoracic Surgery

## 2020-09-28 NOTE — Progress Notes (Signed)
Cardiology Office Note  Date: 09/29/2020   ID: Tiffany White, DOB 02-24-1945, MRN [id number]  PCP:  Almira Bar, MD  Cardiologist:  Carlyle Dolly, MD Electrophysiologist:  None   Chief Complaint: CAD status post CABG x4 05/17/2020  History of Present Illness: Tiffany White is a 75 y.o. female with a history of chest pain, HTN, DM2, hypothyroidism, Vitamin D and B12 deficiency, CKD 3, HLD, smoking.  Patient presented to [facility] on 05/13/2020 with 7 to 10-day history of intermittent chest pain which worsened on day of presentation.  Symptoms were worse with exertion.  She had associated shortness of breath.  She was transferred to [facility] for cardiac catheterization.  Cardiac catheterization performed on 05/14/2020 see report below.  She was found to have severe three-vessel disease.  She underwent CABG x 4 on 05/17/2020 with LIMA to distal LAD, SVG to PDA and right PLA as sequenced graft, SVG to the first obtuse marginal of LCx.  Endoscopic vein harvest from right thigh and lower leg.   Was last here for follow-up status post CABG..  She stated she was doing very well.  She denies any anginal or exertional symptoms, palpitations or arrhythmias, orthostatic symptoms i.e. lightheadedness, dizziness, presyncopal or syncopal episodes.  Denies any SOB or DOE.  Denies any PND, orthopnea denies any bleeding issues.  No claudication-like symptoms, DVT or PE symptoms, or lower extremity edema.  Her median sternotomy scars and vein harvesting scars looked good and she was having no issues.  Heart rate was 96.  Blood pressure 110/60.  She was compliant with all her medications.  Labs on 06/25/2020 at [facility]: CBC unremarkable, c-Met: Glucose 364, sodium 131, chloride 93, alkaline phosphatase 154, hemoglobin A1c 8.8%  She is here for 43-monthfollow-up.  She denies any issues other than systolic blood pressures staying around the 150s range recently.  She denies any  other issues other than some throat burning/irritation.  She denies any history of GERD or esophagitis.  Advised her she could try over-the-counter PPI to see if this might help. She states she has not been very active recently.  She denies any anginal or exertional symptoms.  She denies any orthostatic symptoms, CVA or TIA-like symptoms, PND, orthopnea, palpitations, or bleeding. Denies any claudication-like symptoms, DVT or PE-like symptoms.  She states her blood sugar has been reasonably controlled.  Current cardiac regimen includes aspirin 325 mg daily, metoprolol 25 mg p.o. twice daily.  Crestor 10 mg daily.  She states she needs to establish with a new PCP.  [place]  Past Medical History:  Diagnosis Date   Diabetes mellitus without complication ([facility]    Hyperlipidemia    Thyroid disease     Past Surgical History:  Procedure Laterality Date   ABDOMINAL HYSTERECTOMY     APPENDECTOMY     CORONARY ARTERY BYPASS GRAFT N/A 05/17/2020   Procedure: CORONARY ARTERY BYPASS GRAFTING (CABG) TIMES FOUR, ON PUMP, USING LEFT INTERNAL MAMMARY ARTERY AND ENDOSCOPICALLY HARVESTED RIGHT GREATER SAPHENOUS VEIN;  Surgeon: AWonda Olds MD;  [place] [facility]  Service: Open Heart Surgery;  Laterality: N/A;   ENDOVEIN HARVEST OF GREATER SAPHENOUS VEIN Right 05/17/2020   Procedure: ENDOVEIN HARVEST OF GREATER SAPHENOUS VEIN;  Surgeon: AWonda Olds MD;  [place]  Service: Open Heart Surgery;  Laterality: Right;   IR THORACENTESIS ASP PLEURAL SPACE W/IMG GUIDE  05/22/2020   LEFT HEART CATH AND CORONARY ANGIOGRAPHY N/A 05/14/2020  Procedure: LEFT HEART CATH AND CORONARY ANGIOGRAPHY;  Surgeon: Kelly, Thomas A, MD;  [facility];  Service: Cardiovascular;  Laterality: N/A;   TEE WITHOUT CARDIOVERSION N/A 05/17/2020   Procedure: TRANSESOPHAGEAL ECHOCARDIOGRAM (TEE);  Surgeon: Atkins, Broadus Z, MD;  Location: [facility] OR;  Service: Open Heart Surgery;  Laterality:  N/A;   TONSILLECTOMY      Current Outpatient Medications  Medication Sig Dispense Refill   amLODipine (NORVASC) 5 MG tablet Take 1 tablet (5 mg total) by mouth daily. 90 tablet 1   aspirin EC 325 MG tablet Take 325 mg by mouth daily.     Cholecalciferol 25 MCG (1000 UT) capsule Take 1,000 Units by mouth daily.     cyanocobalamin 1000 MCG tablet Take 1,000 mcg by mouth daily.     levothyroxine (SYNTHROID) 75 MCG tablet Take 1 tablet (75 mcg total) by mouth daily. 30 tablet 11   metFORMIN (GLUCOPHAGE) 1000 MG tablet Take 1 tablet (1,000 mg total) by mouth 2 (two) times daily with a meal. 60 tablet 11   metoprolol tartrate (LOPRESSOR) 25 MG tablet Take 1 tablet (25 mg total) by mouth 2 (two) times daily. 60 tablet 11   Omega-3 Fatty Acids (FISH OIL) 1000 MG CAPS Take 1 capsule by mouth daily.     polyethylene glycol (MIRALAX / GLYCOLAX) 17 g packet Take 17 g by mouth daily. 14 each 0   rosuvastatin (CRESTOR) 10 MG tablet Take 1 tablet (10 mg total) by mouth daily. 30 tablet 11   No current facility-administered medications for this visit.   Allergies:  Statins   Social History: The patient  reports that she has quit smoking. She has never used smokeless tobacco. She reports that she does not currently use alcohol. She reports that she does not use drugs.   Family History: The patient's family history includes CAD in her brother, father, and paternal aunt.   ROS:  Please see the history of present illness. Otherwise, complete review of systems is positive for none.  All other systems are reviewed and negative.   Physical Exam: VS:  BP (!) 150/60   Pulse 70   Ht 5' 2.5" (1.588 m)   Wt 142 lb 3.2 oz (64.5 kg)   SpO2 96%   BMI 25.59 kg/m , BMI Body mass index is 25.59 kg/m.  Wt Readings from Last 3 Encounters:  09/29/20 142 lb 3.2 oz (64.5 kg)  06/26/20 137 lb (62.1 kg)  06/19/20 137 lb 12.8 oz (62.5 kg)    General: Patient appears comfortable at rest. Neck: Supple, no elevated  JVP or carotid bruits, no thyromegaly. Lungs: Clear to auscultation, nonlabored breathing at rest. Cardiac: Regular rate and rhythm, no S3 or significant systolic murmur, no pericardial rub. Extremities: No pitting edema, distal pulses 2+. Skin: Warm and dry.  Median sternotomy scar, chest tube and drain insertion sites clean and dry.  Right leg vein harvesting site clean and dry. Musculoskeletal: No kyphosis. Neuropsychiatric: Alert and oriented x3, affect grossly appropriate.  ECG:  EKG May 18, 2020 normal sinus rhythm rate of 81, nonspecific T wave abnormality.  Recent Labwork: 05/21/2020: Magnesium 2.4 05/22/2020: ALT 16; AST 18; Hemoglobin 9.8; Platelets 238 05/23/2020: BUN 27; Creatinine, Ser 1.31; Potassium 3.9; Sodium 136     Component Value Date/Time   CHOL 188 05/14/2020 0456   TRIG 228 (H) 05/14/2020 0456   HDL 36 (L) 05/14/2020 0456   CHOLHDL 5.2 05/14/2020 0456   VLDL 46 (H) 05/14/2020 0456     [place] 106 (H) 05/14/2020 0456    Other Studies Reviewed Today:   05/14/2020 LEFT HEART CATH AND CORONARY ANGIOGRAPHY      Conclusion     Ost LAD to Prox LAD lesion is 95% stenosed. Ost Cx to Prox Cx lesion is 80% stenosed. Ost RCA lesion is 70% stenosed. Prox RCA lesion is 70% stenosed. Prox RCA to Mid RCA lesion is 50% stenosed. Mid RCA lesion is 75% stenosed. Dist RCA lesion is 90% stenosed. 3rd RPL lesion is 95% stenosed. Ost LM to Dist LM lesion is 10% stenosed.   There is significant coronary calcification with severe three-vessel coronary obstructive disease.  There is 95% ostial LAD stenosis, 70 to 80% ostial circumflex stenosis and diffuse stenoses in a calcified RCA with 70% ostial 70, 50 and 75% mid stenoses, distal 90% stenosis and 95% PLA stenosis.   LVEDP 7 mmHg.   Coronary Diagrams   Diagnostic Dominance: Right             ECHOCARDIOGRAM REPORT         Patient Name:   Tiffany White Date of Exam: 05/13/2020  Medical Rec #:  [id number]      Height:       62.5 in  Accession #:    [id number]    Weight:       147.0 lb  Date of Birth:  [birth year]      BSA:          1.687 m  Patient Age:    38 years      BP:           142/58 mmHg  Patient Gender: F             HR:           75 bpm.  Exam Location:  [facility]   Procedure: 2D Echo, Cardiac Doppler and Color Doppler   Indications:    Chest Pain R07.9     History:        Patient has no prior history of Echocardiogram  examinations.                  Risk Factors:Hypertension, Diabetes and Dyslipidemia.  Elevated                  troponin I level, Chronic kidney disease, stage 3a ([place]).     Sonographer:    Alvino Chapel RCS  Referring Phys: [id number] COURAGE EMOKPAE   IMPRESSIONS     1. Left ventricular ejection fraction, by estimation, is 65 to 70%. The  left ventricle has normal function. The left ventricle has no regional  wall motion abnormalities. There is mild left ventricular hypertrophy.  Left ventricular diastolic parameters  are consistent with Grade I diastolic dysfunction (impaired relaxation).  Elevated left atrial pressure.   2. Right ventricular systolic function is normal. The right ventricular  size is normal.   3. The mitral valve is normal in structure. No evidence of mitral valve  regurgitation. No evidence of mitral stenosis.   4. The aortic valve is tricuspid. There is mild calcification of the  aortic valve. There is mild thickening of the aortic valve. Aortic valve  regurgitation is mild. No aortic stenosis is present.   5. The inferior vena cava is normal in size with greater than 50%  respiratory variability, suggesting right atrial pressure of 3 mmHg.  Treatments: surgery:  CARDIOTHORACIC SURGERY OPERATIVE NOTE   Date of Procedure:    05/17/2020  Preoperative Diagnosis:      Severe 3-vessel Coronary Artery Disease and unstable angina   Postoperative Diagnosis:    Same   Procedure:        Coronary Artery Bypass Grafting x 4             Left  Internal Mammary Artery to Distal Left Anterior Descending Coronary Artery; Saphenous Vein Graft to  Posterior Descending Coronary Artery and right PLA as sequenced graft; Saphenous Vein Graft to 1st Obtuse Marginal Branch of Left Circumflex Coronary Artery; Endoscopic Vein Harvest from right Thigh and Lower Leg   Surgeon:        B.  Zane Atkins, MD   Assistant:       T.  Conte, PA-C   Anesthesia:    General   Operative Findings: Preserved left ventricular systolic function Good quality left internal mammary artery conduit Good quality saphenous vein conduit Good quality target vessels for grafting      Assessment and Plan:  1. CAD in native artery   2. Essential hypertension   3. Mixed hyperlipidemia   4. 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HCC)     1. S/P CABG x 4 / CAD Status post CABG on 05/17/2020  She denies any anginal or exertional symptoms.  Continue aspirin 325 mg daily.  Continue metoprolol 25 mg p.o. twice daily.    2. Essential hypertension She states her systolic blood pressures have been running in the 150s recently.  We will add amlodipine 5 mg to the current regimen.  Advised her to check her blood pressures and call in a few weeks.  Advised her goal is 130/80 systolic.  Continue metoprolol 25 mg p.o. twice daily.  3. Mixed hyperlipidemia Continue Crestor 10 mg daily.    4. 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HCC) Currently only taking metformin 1000 mg p.o. twice daily.  Last hemoglobin A1c on 06/25/2020 8.8%.  Patient states she plans to establish with a PCP in [place] soon.  Medication Adjustments/Labs and Tests Ordered: Current medicines are reviewed at length with the patient today.  Concerns regarding medicines are outlined above.   Disposition: Follow-up with Dr. Branch or APP 6 months  Signed,  Quinn, NP 09/29/2020 2:02 PM    [facility] [place] [address], [place], [place]  [address] Phone: [phone]; Fax: [phone] 

## 2020-09-29 ENCOUNTER — Encounter: Payer: Self-pay | Admitting: Family Medicine

## 2020-09-29 ENCOUNTER — Ambulatory Visit: Payer: Medicare HMO | Admitting: Family Medicine

## 2020-09-29 ENCOUNTER — Other Ambulatory Visit: Payer: Self-pay

## 2020-09-29 VITALS — BP 150/60 | HR 70 | Ht 62.5 in | Wt 142.2 lb

## 2020-09-29 DIAGNOSIS — I10 Essential (primary) hypertension: Secondary | ICD-10-CM

## 2020-09-29 DIAGNOSIS — E782 Mixed hyperlipidemia: Secondary | ICD-10-CM

## 2020-09-29 DIAGNOSIS — E1169 Type 2 diabetes mellitus with other specified complication: Secondary | ICD-10-CM | POA: Diagnosis not present

## 2020-09-29 DIAGNOSIS — I2510 Atherosclerotic heart disease of native coronary artery without angina pectoris: Secondary | ICD-10-CM | POA: Diagnosis not present

## 2020-09-29 MED ORDER — AMLODIPINE BESYLATE 5 MG PO TABS: 5.0000 mg | ORAL_TABLET | Freq: Every day | ORAL | 1 refills | Status: DC

## 2020-09-29 NOTE — Patient Instructions (Signed)
Medication Instructions:  Your physician has recommended you make the following change in your medication:  Start amlodipine 5 mg daily Continue other medications the same  Labwork: none  Testing/Procedures: none  Follow-Up: Your physician recommends that you schedule a follow-up appointment in: 6 months  Any Other Special Instructions Will Be Listed Below (If Applicable). Your physician has requested that you regularly monitor and record your blood pressure readings at home. Please use the same machine at the same time of day to check your readings and record them. Call office in 2 weeks with your readings  If you need a refill on your cardiac medications before your next appointment, please call your pharmacy.

## 2021-03-25 ENCOUNTER — Other Ambulatory Visit: Payer: Self-pay | Admitting: *Deleted

## 2021-03-25 MED ORDER — AMLODIPINE BESYLATE 5 MG PO TABS: 5.0000 mg | ORAL_TABLET | Freq: Every day | ORAL | 0 refills | Status: DC

## 2021-03-29 DIAGNOSIS — E785 Hyperlipidemia, unspecified: Secondary | ICD-10-CM | POA: Insufficient documentation

## 2021-03-29 NOTE — Progress Notes (Deleted)
?  ?Cardiology Office Note ? ? ?Date:  03/29/2021  ? ?ID:  Barrett Holthaus, DOB 1945-08-09, MRN [id number] ? ?PCP:  Almira Bar, MD  ?Cardiologist:   Carlyle Dolly, MD ?Referring:  *** ? ?No chief complaint on file. ? ? ?  ?History of Present Illness: ?Tiffany White is a 76 y.o. female who has been followed by Dr. Zandra Abts.  She underwent CABG x 4 on 05/17/2020 with LIMA to distal LAD, SVG to PDA and right PLA as sequenced graft, SVG to the first obtuse marginal of LCx.  Endoscopic vein harvest from right thigh and lower leg.  She is establishing her follow up here because she lives in [place].    *** ? ? ?Past Medical History:  ?Diagnosis Date  ? Diabetes mellitus without complication ([place])   ? Hyperlipidemia   ? Thyroid disease   ? ? ?Past Surgical History:  ?Procedure Laterality Date  ? ABDOMINAL HYSTERECTOMY    ? APPENDECTOMY    ? CORONARY ARTERY BYPASS GRAFT N/A 05/17/2020  ? Procedure: CORONARY ARTERY BYPASS GRAFTING (CABG) TIMES FOUR, ON PUMP, USING LEFT INTERNAL MAMMARY ARTERY AND ENDOSCOPICALLY HARVESTED RIGHT GREATER SAPHENOUS VEIN;  Surgeon: Wonda Olds, MD;  Location: [place];  Service: Open Heart Surgery;  Laterality: N/A;  ? ENDOVEIN HARVEST OF GREATER SAPHENOUS VEIN Right 05/17/2020  ? Procedure: ENDOVEIN HARVEST OF GREATER SAPHENOUS VEIN;  Surgeon: Wonda Olds, MD;  Location: [place];  Service: Open Heart Surgery;  Laterality: Right;  ? IR THORACENTESIS ASP PLEURAL SPACE W/IMG GUIDE  05/22/2020  ? LEFT HEART CATH AND CORONARY ANGIOGRAPHY N/A 05/14/2020  ? Procedure: LEFT HEART CATH AND CORONARY ANGIOGRAPHY;  Surgeon: Troy Sine, MD;  Location: [facility];  Service: Cardiovascular;  Laterality: N/A;  ? TEE WITHOUT CARDIOVERSION N/A 05/17/2020  ? Procedure: TRANSESOPHAGEAL ECHOCARDIOGRAM (TEE);  Surgeon: Wonda Olds, MD;  Location: [place];  Service: Open Heart Surgery;  Laterality: N/A;  ? TONSILLECTOMY    ? ? ? ?Current Outpatient Medications  ?Medication Sig Dispense Refill   ? amLODipine (NORVASC) 5 MG tablet Take 1 tablet (5 mg total) by mouth daily. 90 tablet 0  ? aspirin EC 325 MG tablet Take 325 mg by mouth daily.    ? Cholecalciferol 25 MCG (1000 UT) capsule Take 1,000 Units by mouth daily.    ? cyanocobalamin 1000 MCG tablet Take 1,000 mcg by mouth daily.    ? levothyroxine (SYNTHROID) 75 MCG tablet Take 1 tablet (75 mcg total) by mouth daily. 30 tablet 11  ? metFORMIN (GLUCOPHAGE) 1000 MG tablet Take 1 tablet (1,000 mg total) by mouth 2 (two) times daily with a meal. 60 tablet 11  ? metoprolol tartrate (LOPRESSOR) 25 MG tablet Take 1 tablet (25 mg total) by mouth 2 (two) times daily. 60 tablet 11  ? Omega-3 Fatty Acids (FISH OIL) 1000 MG CAPS Take 1 capsule by mouth daily.    ? polyethylene glycol (MIRALAX / GLYCOLAX) 17 g packet Take 17 g by mouth daily. 14 each 0  ? rosuvastatin (CRESTOR) 10 MG tablet Take 1 tablet (10 mg total) by mouth daily. 30 tablet 11  ? ?No current facility-administered medications for this visit.  ? ? ?Allergies:   Statins  ? ? ?Social History:  The patient  reports that she has quit smoking. She has never used smokeless tobacco. She reports that she does not currently use alcohol. She reports that she does not use drugs.  ? ?Family History:  The  patient's ***family history includes CAD in her brother, father, and paternal aunt.  ? ? ?ROS:  Please see the history of present illness.   Otherwise, review of systems are positive for {NONE DEFAULTED:18576}.   All other systems are reviewed and negative.  ? ? ?PHYSICAL EXAM: ?VS:  There were no vitals taken for this visit. , BMI There is no height or weight on file to calculate BMI. ?GENERAL:  Well appearing ?HEENT:  Pupils equal round and reactive, fundi not visualized, oral mucosa unremarkable ?NECK:  No jugular venous distention, waveform within normal limits, carotid upstroke brisk and symmetric, no bruits, no thyromegaly ?LYMPHATICS:  No cervical, inguinal adenopathy ?LUNGS:  Clear to auscultation  bilaterally ?BACK:  No CVA tenderness ?CHEST:  Unremarkable ?HEART:  PMI not displaced or sustained,S1 and S2 within normal limits, no S3, no S4, no clicks, no rubs, *** murmurs ?ABD:  Flat, positive bowel sounds normal in frequency in pitch, no bruits, no rebound, no guarding, no midline pulsatile mass, no hepatomegaly, no splenomegaly ?EXT:  2 plus pulses throughout, no edema, no cyanosis no clubbing ?SKIN:  No rashes no nodules ?NEURO:  Cranial nerves II through XII grossly intact, motor grossly intact throughout ?PSYCH:  Cognitively intact, oriented to person place and time ? ? ? ?EKG:  EKG {ACTION; IS/IS GXQ:[id number]} ordered today. ?The ekg ordered today demonstrates *** ? ? ?Recent Labs: ?05/21/2020: Magnesium 2.4 ?05/22/2020: ALT 16; Hemoglobin 9.8; Platelets 238 ?05/23/2020: BUN 27; Creatinine, Ser 1.31; Potassium 3.9; Sodium 136  ? ? ?Lipid Panel ?   ?Component Value Date/Time  ? CHOL 188 05/14/2020 0456  ? TRIG 228 (H) 05/14/2020 0456  ? HDL 36 (L) 05/14/2020 0456  ? CHOLHDL 5.2 05/14/2020 0456  ? VLDL 46 (H) 05/14/2020 0456  ? [place] 106 (H) 05/14/2020 0456  ? ?  ? ?Wt Readings from Last 3 Encounters:  ?09/29/20 142 lb 3.2 oz (64.5 kg)  ?06/26/20 137 lb (62.1 kg)  ?06/19/20 137 lb 12.8 oz (62.5 kg)  ?  ? ? ?Other studies Reviewed: ?Additional studies/ records that were reviewed today include: ***. ?Review of the above records demonstrates:  Please see elsewhere in the note.  *** ? ? ?ASSESSMENT AND PLAN: ? ?CAD:  *** ? ?HTN:  ***  ? ?Dyslipidemia:  ***  ? ?Type II DM:  ***  ? ? ?Current medicines are reviewed at length with the patient today.  The patient {ACTIONS; HAS/DOES NOT HAVE:19233} concerns regarding medicines. ? ?The following changes have been made:  {PLAN; NO CHANGE:13088:s} ? ?Labs/ tests ordered today include: *** ?No orders of the defined types were placed in this encounter. ? ? ? ?Disposition:   FU with ***  ? ? ?Signed, ?Minus Breeding, MD  ?03/29/2021 8:28 PM    ?[place] ? ? ? ?

## 2021-04-01 ENCOUNTER — Other Ambulatory Visit: Payer: Self-pay

## 2021-04-01 ENCOUNTER — Encounter: Payer: Self-pay | Admitting: Cardiology

## 2021-04-01 ENCOUNTER — Ambulatory Visit (INDEPENDENT_AMBULATORY_CARE_PROVIDER_SITE_OTHER): Payer: Medicare HMO | Admitting: Cardiology

## 2021-04-01 ENCOUNTER — Other Ambulatory Visit: Payer: Self-pay | Admitting: *Deleted

## 2021-04-01 VITALS — BP 140/62 | HR 70 | Ht 62.5 in | Wt 143.0 lb

## 2021-04-01 DIAGNOSIS — E785 Hyperlipidemia, unspecified: Secondary | ICD-10-CM

## 2021-04-01 DIAGNOSIS — I2510 Atherosclerotic heart disease of native coronary artery without angina pectoris: Secondary | ICD-10-CM

## 2021-04-01 DIAGNOSIS — E1169 Type 2 diabetes mellitus with other specified complication: Secondary | ICD-10-CM

## 2021-04-01 DIAGNOSIS — Z79899 Other long term (current) drug therapy: Secondary | ICD-10-CM

## 2021-04-01 DIAGNOSIS — I10 Essential (primary) hypertension: Secondary | ICD-10-CM | POA: Diagnosis not present

## 2021-04-01 DIAGNOSIS — E782 Mixed hyperlipidemia: Secondary | ICD-10-CM

## 2021-04-01 MED ORDER — METOPROLOL TARTRATE 50 MG PO TABS: 50.0000 mg | ORAL_TABLET | Freq: Two times a day (BID) | ORAL | 3 refills | Status: DC

## 2021-04-01 MED ORDER — ROSUVASTATIN CALCIUM 20 MG PO TABS: 20.0000 mg | ORAL_TABLET | Freq: Every day | ORAL | 3 refills | Status: DC

## 2021-04-01 MED ORDER — ASPIRIN EC 81 MG PO TBEC: 81.0000 mg | DELAYED_RELEASE_TABLET | Freq: Every day | ORAL | 3 refills | Status: AC

## 2021-04-01 NOTE — Patient Instructions (Signed)
Medication Instructions:  ?Please decrease Aspirin to 81 mg a day. ?Take Crestor 20 mg a day and increase Metoprolol Tartrate to 50 mg twice a day. ?Continue all other medications as listed. ? ?*If you need a refill on your cardiac medications before your next appointment, please call your pharmacy* ? ?Lab Work: ?Please have Lipid panel in 10 weeks at [facility]. ? ?If you have labs (blood work) drawn today and your tests are completely normal, you will receive your results only by: ?MyChart Message (if you have MyChart) OR ?A paper copy in the mail ?If you have any lab test that is abnormal or we need to change your treatment, we will call you to review the results. ? ?Follow-Up: ?At [facility], you and your health needs are our priority.  As part of our continuing mission to provide you with exceptional heart care, we have created designated Provider Care Teams.  These Care Teams include your primary Cardiologist (physician) and Advanced Practice Providers (APPs -  Physician Assistants and Nurse Practitioners) who all work together to provide you with the care you need, when you need it. ? ?We recommend signing up for the patient portal called "MyChart".  Sign up information is provided on this After Visit Summary.  MyChart is used to connect with patients for Virtual Visits (Telemedicine).  Patients are able to view lab/test results, encounter notes, upcoming appointments, etc.  Non-urgent messages can be sent to your provider as well.   ?To learn more about what you can do with MyChart, go to [email or web address].   ? ?Your next appointment:   ?6 month(s) ? ?The format for your next appointment:   ?In Person ? ?Provider:   ?Minus Breeding, MD{ ? ? ?Thank you for choosing [facility]!! ? ? ? ?

## 2021-04-01 NOTE — Progress Notes (Signed)
?  ?Cardiology Office Note ? ? ?Date:  04/02/2021  ? ?ID:  Tiffany White, DOB 1945/10/25, MRN [id number] ? ?PCP:  Ivy Lynn, NP  ?Cardiologist:   Minus Breeding, MD ? ? ?Chief Complaint  ?Patient presents with  ? Coronary Artery Disease  ? ? ?  ?History of Present Illness: ?Tiffany White is a 76 y.o. female who has been followed by Dr. Zandra Abts.  She underwent CABG x 4 on 05/17/2020 with LIMA to distal LAD, SVG to PDA and right PLA as sequenced graft, SVG to the first obtuse marginal of LCx.  Endoscopic vein harvest from right thigh and lower leg.  She is establishing her follow up here because she lives in [place].     ? ?She did not have prior cardiac history before she presented in April.  I did review her catheterization films for this visit.  She had diffuse three-vessel disease with somewhat small vessels.  However, she did very well with bypass.  She does have some mild shortness of breath with activities but she has not exercising as I would suggest.  She is not having any of the burning chest discomfort she was having.  She is not having any neck or arm discomfort.  She has no resting shortness of breath, PND or orthopnea.  She had no palpitations, presyncope or syncope. ? ? ?Past Medical History:  ?Diagnosis Date  ? CAD (coronary artery disease)   ? Diabetes mellitus without complication ([place])   ? Hyperlipidemia   ? Thyroid disease   ? ? ?Past Surgical History:  ?Procedure Laterality Date  ? ABDOMINAL HYSTERECTOMY    ? APPENDECTOMY    ? CORONARY ARTERY BYPASS GRAFT N/A 05/17/2020  ? Procedure: CORONARY ARTERY BYPASS GRAFTING (CABG) TIMES FOUR, ON PUMP, USING LEFT INTERNAL MAMMARY ARTERY AND ENDOSCOPICALLY HARVESTED RIGHT GREATER SAPHENOUS VEIN;  Surgeon: Wonda Olds, MD;  Location: [facility];  Service: Open Heart Surgery;  Laterality: N/A;  ? ENDOVEIN HARVEST OF GREATER SAPHENOUS VEIN Right 05/17/2020  ? Procedure: ENDOVEIN HARVEST OF GREATER SAPHENOUS VEIN;  Surgeon: Wonda Olds, MD;  Location:  [facility];  Service: Open Heart Surgery;  Laterality: Right;  ? IR THORACENTESIS ASP PLEURAL SPACE W/IMG GUIDE  05/22/2020  ? LEFT HEART CATH AND CORONARY ANGIOGRAPHY N/A 05/14/2020  ? Procedure: LEFT HEART CATH AND CORONARY ANGIOGRAPHY;  Surgeon: Troy Sine, MD;  Location: [facility];  Service: Cardiovascular;  Laterality: N/A;  ? TEE WITHOUT CARDIOVERSION N/A 05/17/2020  ? Procedure: TRANSESOPHAGEAL ECHOCARDIOGRAM (TEE);  Surgeon: Wonda Olds, MD;  Location: [facility];  Service: Open Heart Surgery;  Laterality: N/A;  ? TONSILLECTOMY    ? ? ? ?Current Outpatient Medications  ?Medication Sig Dispense Refill  ? amLODipine (NORVASC) 5 MG tablet Take 1 tablet (5 mg total) by mouth daily. 90 tablet 0  ? aspirin EC 81 MG tablet Take 1 tablet (81 mg total) by mouth daily. Swallow whole. 90 tablet 3  ? Cholecalciferol 25 MCG (1000 UT) capsule Take 2,000 Units by mouth daily.    ? cyanocobalamin 1000 MCG tablet Take 1,000 mcg by mouth daily.    ? insulin aspart (NOVOLOG) 100 UNIT/ML injection Inject 14 Units into the skin 2 (two) times daily.    ? levothyroxine (SYNTHROID) 75 MCG tablet Take 1 tablet (75 mcg total) by mouth daily. 30 tablet 11  ? metFORMIN (GLUCOPHAGE) 1000 MG tablet Take 1 tablet (1,000 mg total) by mouth 2 (two) times daily with a meal.  60 tablet 11  ? metoprolol tartrate (LOPRESSOR) 50 MG tablet Take 1 tablet (50 mg total) by mouth 2 (two) times daily. 180 tablet 3  ? rosuvastatin (CRESTOR) 20 MG tablet Take 1 tablet (20 mg total) by mouth daily. 90 tablet 3  ? senna (SENOKOT) 8.6 MG tablet Take 1 tablet by mouth daily.    ? Omega-3 Fatty Acids (FISH OIL) 1000 MG CAPS Take 1 capsule by mouth daily.    ? polyethylene glycol (MIRALAX / GLYCOLAX) 17 g packet Take 17 g by mouth daily. 14 each 0  ? ?No current facility-administered medications for this visit.  ? ? ?Allergies:   Patient has no active allergies.  ? ?ROS:  Please see the history of present illness.   Otherwise, review of systems are  positive for frequent urination.   All other systems are reviewed and negative.  ? ? ?PHYSICAL EXAM: ?VS:  BP 140/62   Pulse 70   Ht 5' 2.5" (1.588 m)   Wt 143 lb (64.9 kg)   BMI 25.74 kg/m?  , BMI Body mass index is 25.74 kg/m?. ?GENERAL:  Well appearing ?HEENT:  Pupils equal round and reactive, fundi not visualized, oral mucosa unremarkable ?NECK:  No jugular venous distention, waveform within normal limits, carotid upstroke brisk and symmetric, no bruits, no thyromegaly ?LYMPHATICS:  No cervical, inguinal adenopathy ?LUNGS:  Clear to auscultation bilaterally ?BACK:  No CVA tenderness ?CHEST:  Well healed sternotomy scar. ?HEART:  PMI not displaced or sustained,S1 and S2 within normal limits, no S3, no S4, no clicks, no rubs, no murmurs ?ABD:  Flat, positive bowel sounds normal in frequency in pitch, no bruits, no rebound, no guarding, no midline pulsatile mass, no hepatomegaly, no splenomegaly ?EXT:  2 plus pulses throughout, no edema, no cyanosis no clubbing ? ?EKG:  EKG is ordered today. ?The ekg ordered today demonstrates sinus rhythm, rate 70, axis within normal limits, intervals within normal limits, no acute ST-T wave changes. ? ? ?Recent Labs: ?05/21/2020: Magnesium 2.4 ?05/22/2020: ALT 16; Hemoglobin 9.8; Platelets 238 ?05/23/2020: BUN 27; Creatinine, Ser 1.31; Potassium 3.9; Sodium 136  ? ? ?Lipid Panel ?   ?Component Value Date/Time  ? CHOL 188 05/14/2020 0456  ? TRIG 228 (H) 05/14/2020 0456  ? HDL 36 (L) 05/14/2020 0456  ? CHOLHDL 5.2 05/14/2020 0456  ? VLDL 46 (H) 05/14/2020 0456  ? Davis 106 (H) 05/14/2020 0456  ? ?  ? ?Wt Readings from Last 3 Encounters:  ?04/01/21 143 lb (64.9 kg)  ?09/29/20 142 lb 3.2 oz (64.5 kg)  ?06/26/20 137 lb (62.1 kg)  ?  ? ? ?Other studies Reviewed: ?Additional studies/ records that were reviewed today include: Office records, labs. ?Review of the above records demonstrates:  Please see elsewhere in the note.   ? ? ?ASSESSMENT AND PLAN: ? ?CAD: She has done well with  bypass.  She needs aggressive secondary risk reduction as below.  Of note she can go to 81 mg aspirin ? ?HTN: Her blood pressure is not at target.  I will increase her metoprolol to 50 mg twice daily. ? ?Dyslipidemia: She is not on moderate dose statin.  There was a list of intolerance but she really does tolerate 10 mg of Crestor and I will give 20 and check a lipid profile in about 10 weeks.   ? ?Type II DM: I wanted to start her on Farxiga.  She is worried about frequent urination.  She is going to see a new primary care I  will defer the management but she understands she needs better blood sugar control.  Her A1c was 7.8 in April.  We did talk at length about diet and exercise. ? ? ?Current medicines are reviewed at length with the patient today.  The patient does not have concerns regarding medicines. ? ?The following changes have been made:  no change ? ?Labs/ tests ordered today include:  ? ?Orders Placed This Encounter  ?Procedures  ? Lipid panel  ? EKG 12-Lead  ? ? ? ?Disposition:   FU with me in 6 months ? ? ?Signed, ?Minus Breeding, MD  ?04/02/2021 7:32 AM    ?[place] ? ? ? ?

## 2021-04-02 ENCOUNTER — Encounter: Payer: Self-pay | Admitting: Cardiology

## 2021-04-03 ENCOUNTER — Ambulatory Visit: Payer: Medicare HMO | Admitting: Nurse Practitioner

## 2021-04-07 ENCOUNTER — Encounter: Payer: Self-pay | Admitting: Nurse Practitioner

## 2021-05-05 ENCOUNTER — Ambulatory Visit (INDEPENDENT_AMBULATORY_CARE_PROVIDER_SITE_OTHER): Payer: Medicare HMO | Admitting: Nurse Practitioner

## 2021-05-05 ENCOUNTER — Encounter: Payer: Self-pay | Admitting: Nurse Practitioner

## 2021-05-05 VITALS — BP 133/54 | HR 60 | Temp 98.3°F | Ht 62.5 in | Wt 142.0 lb

## 2021-05-05 DIAGNOSIS — I10 Essential (primary) hypertension: Secondary | ICD-10-CM

## 2021-05-05 DIAGNOSIS — E1129 Type 2 diabetes mellitus with other diabetic kidney complication: Secondary | ICD-10-CM | POA: Diagnosis not present

## 2021-05-05 DIAGNOSIS — Z7689 Persons encountering health services in other specified circumstances: Secondary | ICD-10-CM | POA: Insufficient documentation

## 2021-05-05 DIAGNOSIS — E039 Hypothyroidism, unspecified: Secondary | ICD-10-CM

## 2021-05-05 NOTE — Patient Instructions (Signed)
Hypertension, Adult ?Hypertension is another name for high blood pressure. High blood pressure forces your heart to work harder to pump blood. This can cause problems over time. ?There are two numbers in a blood pressure reading. There is a top number (systolic) over a bottom number (diastolic). It is best to have a blood pressure that is below 120/80. Healthy choices can help lower your blood pressure, or you may need medicine to help lower it. ?What are the causes? ?The cause of this condition is not known. Some conditions may be related to high blood pressure. ?What increases the risk? ?Smoking. ?Having type 2 diabetes mellitus, high cholesterol, or both. ?Not getting enough exercise or physical activity. ?Being overweight. ?Having too much fat, sugar, calories, or salt (sodium) in your diet. ?Drinking too much alcohol. ?Having long-term (chronic) kidney disease. ?Having a family history of high blood pressure. ?Age. Risk increases with age. ?Race. You may be at higher risk if you are African American. ?Gender. Men are at higher risk than women before age 45. After age 65, women are at higher risk than men. ?Having obstructive sleep apnea. ?Stress. ?What are the signs or symptoms? ?High blood pressure may not cause symptoms. Very high blood pressure (hypertensive crisis) may cause: ?Headache. ?Feelings of worry or nervousness (anxiety). ?Shortness of breath. ?Nosebleed. ?A feeling of being sick to your stomach (nausea). ?Throwing up (vomiting). ?Changes in how you see. ?Very bad chest pain. ?Seizures. ?How is this treated? ?This condition is treated by making healthy lifestyle changes, such as: ?Eating healthy foods. ?Exercising more. ?Drinking less alcohol. ?Your health care provider may prescribe medicine if lifestyle changes are not enough to get your blood pressure under control, and if: ?Your top number is above 130. ?Your bottom number is above 80. ?Your personal target blood pressure may vary. ?Follow  these instructions at home: ?Eating and drinking ? ?If told, follow the DASH eating plan. To follow this plan: ?Fill one half of your plate at each meal with fruits and vegetables. ?Fill one fourth of your plate at each meal with whole grains. Whole grains include whole-wheat pasta, brown rice, and whole-grain bread. ?Eat or drink low-fat dairy products, such as skim milk or low-fat yogurt. ?Fill one fourth of your plate at each meal with low-fat (lean) proteins. Low-fat proteins include fish, chicken without skin, eggs, beans, and tofu. ?Avoid fatty meat, cured and processed meat, or chicken with skin. ?Avoid pre-made or processed food. ?Eat less than 1,500 mg of salt each day. ?Do not drink alcohol if: ?Your doctor tells you not to drink. ?You are pregnant, may be pregnant, or are planning to become pregnant. ?If you drink alcohol: ?Limit how much you use to: ?0-1 drink a day for women. ?0-2 drinks a day for men. ?Be aware of how much alcohol is in your drink. In the [place] one drink equals one 12 oz bottle of beer (355 mL), one 5 oz glass of wine (148 mL), or one 1? oz glass of hard liquor (44 mL). ?Lifestyle ? ?Work with your doctor to stay at a healthy weight or to lose weight. Ask your doctor what the best weight is for you. ?Get at least 30 minutes of exercise most days of the week. This may include walking, swimming, or biking. ?Get at least 30 minutes of exercise that strengthens your muscles (resistance exercise) at least 3 days a week. This may include lifting weights or doing Pilates. ?Do not use any products that contain nicotine or tobacco, such   as cigarettes, e-cigarettes, and chewing tobacco. If you need help quitting, ask your doctor. ?Check your blood pressure at home as told by your doctor. ?Keep all follow-up visits as told by your doctor. This is important. ?Medicines ?Take over-the-counter and prescription medicines only as told by your doctor. Follow directions carefully. ?Do not skip doses of  blood pressure medicine. The medicine does not work as well if you skip doses. Skipping doses also puts you at risk for problems. ?Ask your doctor about side effects or reactions to medicines that you should watch for. ?Contact a doctor if you: ?Think you are having a reaction to the medicine you are taking. ?Have headaches that keep coming back (recurring). ?Feel dizzy. ?Have swelling in your ankles. ?Have trouble with your vision. ?Get help right away if you: ?Get a very bad headache. ?Start to feel mixed up (confused). ?Feel weak or numb. ?Feel faint. ?Have very bad pain in your: ?Chest. ?Belly (abdomen). ?Throw up more than once. ?Have trouble breathing. ?Summary ?Hypertension is another name for high blood pressure. ?High blood pressure forces your heart to work harder to pump blood. ?For most people, a normal blood pressure is less than 120/80. ?Making healthy choices can help lower blood pressure. If your blood pressure does not get lower with healthy choices, you may need to take medicine. ?This information is not intended to replace advice given to you by your health care provider. Make sure you discuss any questions you have with your health care provider. ?Document Revised: 09/28/2017 Document Reviewed: 09/28/2017 ?Elsevier Patient Education ? 2022 [place]. ?Diabetes Mellitus Basics ?Diabetes mellitus, or diabetes, is a long-term (chronic) disease. It occurs when the body does not properly use sugar (glucose) that is released from food after you eat. ?Diabetes mellitus may be caused by one or both of these problems: ?Your pancreas does not make enough of a hormone called insulin. ?Your body does not react in a normal way to the insulin that it makes. ?Insulin lets glucose enter cells in your body. This gives you energy. If you have diabetes, glucose cannot get into cells. This causes high blood glucose (hyperglycemia). ?How to treat and manage diabetes ?You may need to take insulin or other diabetes  medicines daily to keep your glucose in balance. If you are prescribed insulin, you will learn how to give yourself insulin by injection. You may need to adjust the amount of insulin you take based on the foods that you eat. ?You will need to check your blood glucose levels using a glucose monitor as told by your health care provider. The readings can help determine if you have low or high blood glucose. ?Generally, you should have these blood glucose levels: ?Before meals (preprandial): 80-130 mg/dL (4.4-7.2 mmol/L). ?After meals (postprandial): below 180 mg/dL (10 mmol/L). ?Hemoglobin A1c (HbA1c) level: less than 7%. ?Your health care provider will set treatment goals for you. ?Keep all follow-up visits. This is important. ?Follow these instructions at home: ?Diabetes medicines ?Take your diabetes medicines every day as told by your health care provider. List your diabetes medicines here: ?Name of medicine: ______________________________ ?Amount (dose): _______________ Time (a.m./p.m.): _______________ Notes: ___________________________________ ?Name of medicine: ______________________________ ?Amount (dose): _______________ Time (a.m./p.m.): _______________ Notes: ___________________________________ ?Name of medicine: ______________________________ ?Amount (dose): _______________ Time (a.m./p.m.): _______________ Notes: ___________________________________ ?Insulin ?If you use insulin, list the types of insulin you use here: ?Insulin type: ______________________________ ?Amount (dose): _______________ Time (a.m./p.m.): _______________Notes: ___________________________________ ?Insulin type: ______________________________ ?Amount (dose): _______________ Time (a.m./p.m.): _______________ Notes: ___________________________________ ?Insulin  type: ______________________________ ?Amount (dose): _______________ Time (a.m./p.m.): _______________ Notes: ___________________________________ ?Insulin type:  ______________________________ ?Amount (dose): _______________ Time (a.m./p.m.): _______________ Notes: ___________________________________ ?Insulin type: ______________________________ ?Amount (dose): _______________ Time

## 2021-05-05 NOTE — Assessment & Plan Note (Signed)
Patient is following up for hypertension.  Currently blood pressure is 133/54.  Patient is on amlodipine 5 mg tablet by mouth daily, Metroprolol 50 mg tablet by mouth twice daily.  Patient's blood pressure is currently low and decreased Metroprolol 25 mg tablet daily.  Advised patient to keep a blood pressure log for 1 week and follow-up in 2 weeks. ?

## 2021-05-05 NOTE — Progress Notes (Signed)
? ?New Patient Note ? ?RE: Tiffany White MRN: [id number] DOB: [birth year] ?Date of Office Visit: 05/05/2021 ? ?Chief Complaint: New Patient (Initial Visit), Hypertension, Diabetes, and Hypothyroidism ? ?History of Present Illness: ? ? ?Pt presents for follow up of hypertension. Patient was diagnosed in 08/27/2019. The patient is tolerating the medication well without side effects. Compliance with treatment has been good; including taking medication as directed , maintains a healthy diet and regular exercise regimen , and following up as directed.  ? ? ?The patient presents with history of type II diabetes mellitus without complications. Patient was diagnosed in 05/13/2020. Compliance with treatment has been good; the patient takes medication as directed , maintains a diabetic diet and an exercise regimen , follows up as directed , and is keeping a glucose diary. Sugars runs : Patient did not bring the blood glucose log to clinic.  Completed A1c results pending.  Patient specifically denies associated symptoms, including blurred vision, fatigue, polydipsia, polyphagia and polyuria . Patient denies hypoglycemia. In regard to preventative care, the patient performs foot self-exams daily and last ophthalmology exam was in : Patient has a eye appointment tomorrow.  ? ?Thyroid: Patient presents for evaluation of hypothyroidism. Current symptoms include weight gain. Patient denies anxiousness, feeling excessive energy, tremulousness, palpitations, sweating.   ? ? ? ?Assessment and Plan: ?Tiffany White is a 76 y.o. female with: ?Essential hypertension ?Patient is following up for hypertension.  Currently blood pressure is 133/54.  Patient is on amlodipine 5 mg tablet by mouth daily, Metroprolol 50 mg tablet by mouth twice daily.  Patient's blood pressure is currently low and decreased Metroprolol 25 mg tablet daily.  Advised patient to keep a blood pressure log for 1 week and follow-up in 2 weeks. ? ?Hypothyroidism ?Labs completed  results pending.  Currently on levothyroxine 75 mcg tablet by mouth daily.  No new signs and symptoms of worsening hypothyroidism ? ?Encounter to establish care ?Patient is establishing care today with a history of diabetes, hypothyroidism, hypertension.  Completed education on health management and disease prevention. ?Patient will return for complete physical and labs.  Printed handouts given. ? ? ?Diabetes mellitus, type II ([place]) ?Labs completed results pending-CBC, CMP, lipid panel, TSH. ? ?Return in about 3 months (around 08/04/2021). ? ? ?Diagnostics: ? ? ?Past Medical History: ?Patient Active Problem List  ? Diagnosis Date Noted  ? Encounter to establish care 05/05/2021  ? Dyslipidemia 03/29/2021  ? 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 Angina pectoris ([place]) 05/14/2020  ? Coronary artery disease involving native coronary artery of native heart with unstable angina pectoris ([place])   ? Chest pain 05/13/2020  ? Diabetes mellitus, type II ([place]) 05/13/2020  ? Elevated troponin I level 05/13/2020  ? Hyperglycemia due to diabetes mellitus ([place]) 05/13/2020  ? Vitamin B12 deficiency 05/13/2020  ? Chronic kidney disease, stage 3a ([place]) 08/27/2019  ? Essential hypertension 08/27/2019  ? 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[place]) 07/12/2016  ? Hypothyroidism 04/28/2016  ? Symptomatic menopausal or female climacteric states 03/21/2014  ? Hypertriglyceridemia 10/03/2007  ? Vitamin D deficiency 10/20/2006  ? Other specified disorders of cartilage, unspecified sites 10/13/2006  ? Herpes zoster 06/30/2001  ? Benign neoplasm of colon 09/02/1998  ? Acute pancreatitis 04/02/1991  ? ?Past Medical History:  ?Diagnosis Date  ? CAD (coronary artery disease)   ? Diabetes mellitus without complication ([place])   ? Hyperlipidemia   ? Thyroid disease   ? ?Past Surgical History: ?Past Surgical History:  ?Procedure Laterality Date  ?  ABDOMINAL HYSTERECTOMY    ? APPENDECTOMY    ? CORONARY ARTERY BYPASS GRAFT N/A 05/17/2020  ? Procedure: CORONARY  ARTERY BYPASS GRAFTING (CABG) TIMES FOUR, ON PUMP, USING LEFT INTERNAL MAMMARY ARTERY AND ENDOSCOPICALLY HARVESTED RIGHT GREATER SAPHENOUS VEIN;  Surgeon: Wonda Olds, MD;  Location: [facility];  Service: Open Heart Surgery;  Laterality: N/A;  ? ENDOVEIN HARVEST OF GREATER SAPHENOUS VEIN Right 05/17/2020  ? Procedure: ENDOVEIN HARVEST OF GREATER SAPHENOUS VEIN;  Surgeon: Wonda Olds, MD;  Location: [facility];  Service: Open Heart Surgery;  Laterality: Right;  ? IR THORACENTESIS ASP PLEURAL SPACE W/IMG GUIDE  05/22/2020  ? LEFT HEART CATH AND CORONARY ANGIOGRAPHY N/A 05/14/2020  ? Procedure: LEFT HEART CATH AND CORONARY ANGIOGRAPHY;  Surgeon: Troy Sine, MD;  Location: [facility];  Service: Cardiovascular;  Laterality: N/A;  ? TEE WITHOUT CARDIOVERSION N/A 05/17/2020  ? Procedure: TRANSESOPHAGEAL ECHOCARDIOGRAM (TEE);  Surgeon: Wonda Olds, MD;  Location: [facility];  Service: Open Heart Surgery;  Laterality: N/A;  ? TONSILLECTOMY    ? TONSILLECTOMY    ? ?Medication List:  ?Current Outpatient Medications  ?Medication Sig Dispense Refill  ? amLODipine (NORVASC) 5 MG tablet Take 1 tablet (5 mg total) by mouth daily. 90 tablet 0  ? aspirin EC 81 MG tablet Take 1 tablet (81 mg total) by mouth daily. Swallow whole. 90 tablet 3  ? Cholecalciferol 25 MCG (1000 UT) capsule Take 2,000 Units by mouth daily.    ? cyanocobalamin 1000 MCG tablet Take 1,000 mcg by mouth daily.    ? insulin aspart (NOVOLOG) 100 UNIT/ML injection Inject 14 Units into the skin 2 (two) times daily.    ? levothyroxine (SYNTHROID) 75 MCG tablet Take 1 tablet (75 mcg total) by mouth daily. 30 tablet 11  ? metFORMIN (GLUCOPHAGE) 1000 MG tablet Take 1 tablet (1,000 mg total) by mouth 2 (two) times daily with a meal. 60 tablet 11  ? metoprolol tartrate (LOPRESSOR) 50 MG tablet Take 1 tablet (50 mg total) by mouth 2 (two) times daily. 180 tablet 3  ? Omega-3 Fatty Acids (FISH OIL) 1000 MG CAPS Take 1 capsule by mouth daily.    ? polyethylene  glycol (MIRALAX / GLYCOLAX) 17 g packet Take 17 g by mouth daily. 14 each 0  ? rosuvastatin (CRESTOR) 20 MG tablet Take 1 tablet (20 mg total) by mouth daily. 90 tablet 3  ? senna (SENOKOT) 8.6 MG tablet Take 1 tablet by mouth daily.    ? GNP ULTICARE PEN NEEDLES 32G X 4 MM MISC USE AS DIRECTED 3 TIMES A DAY    ? ?No current facility-administered medications for this visit.  ? ?Allergies: ?No Active Allergies ?Social History: ?Social History  ? ?Socioeconomic History  ? Marital status: Married  ?  Spouse name: Not on file  ? Number of children: Not on file  ? Years of education: Not on file  ? Highest education level: Not on file  ?Occupational History  ? Not on file  ?Tobacco Use  ? Smoking status: Former  ?  Passive exposure: Never  ? Smokeless tobacco: Never  ?Vaping Use  ? Vaping Use: Never used  ?Substance and Sexual Activity  ? Alcohol use: Not Currently  ? Drug use: Never  ? Sexual activity: Not on file  ?Other Topics Concern  ? Not on file  ?Social History Narrative  ? Not on file  ? ?Social Determinants of Health  ? ?Financial Resource Strain: Not on file  ?  Food Insecurity: Not on file  ?Transportation Needs: Not on file  ?Physical Activity: Not on file  ?Stress: Not on file  ?Social Connections: Not on file  ? ? ? ? ? ?Family History: ?Family History  ?Problem Relation Age of Onset  ? CAD Father   ? CAD Brother   ? CAD Paternal Aunt   ? ?     ? ?Review of Systems ?Objective: ?BP (!) 133/54   Pulse 60   Temp 98.3 ?F (36.8 ?C)   Ht 5' 2.5" (1.588 m)   Wt 142 lb (64.4 kg)   SpO2 97%   BMI 25.56 kg/m?  ?Body mass index is 25.56 kg/m?[place] Kitchen ?Physical Exam ?The plan was reviewed with the patient/family, and all questions/concerned were addressed. ? ?It was my pleasure to see Tiffany White today and participate in her care. Please feel free to contact me with any questions or concerns. ? ?Sincerely, ? ?Jac Canavan NP ?[place] ?

## 2021-05-05 NOTE — Assessment & Plan Note (Signed)
Patient is establishing care today with a history of diabetes, hypothyroidism, hypertension.  Completed education on health management and disease prevention. ?Patient will return for complete physical and labs.  Printed handouts given. ? ?

## 2021-05-05 NOTE — Assessment & Plan Note (Signed)
Labs completed results pending-CBC, CMP, lipid panel, TSH. ?

## 2021-05-05 NOTE — Assessment & Plan Note (Signed)
Labs completed results pending.  Currently on levothyroxine 75 mcg tablet by mouth daily.  No new signs and symptoms of worsening hypothyroidism ?

## 2021-05-06 ENCOUNTER — Other Ambulatory Visit: Payer: Medicare HMO

## 2021-05-06 ENCOUNTER — Ambulatory Visit (INDEPENDENT_AMBULATORY_CARE_PROVIDER_SITE_OTHER): Payer: Medicare HMO | Admitting: *Deleted

## 2021-05-06 ENCOUNTER — Other Ambulatory Visit: Payer: Self-pay | Admitting: Nurse Practitioner

## 2021-05-06 VITALS — BP 126/53 | HR 80

## 2021-05-06 DIAGNOSIS — D649 Anemia, unspecified: Secondary | ICD-10-CM

## 2021-05-06 DIAGNOSIS — E039 Hypothyroidism, unspecified: Secondary | ICD-10-CM

## 2021-05-06 DIAGNOSIS — I10 Essential (primary) hypertension: Secondary | ICD-10-CM

## 2021-05-06 MED ORDER — FERROUS SULFATE 325 (65 FE) MG PO TBEC: 325.0000 mg | DELAYED_RELEASE_TABLET | Freq: Every day | ORAL | 1 refills | Status: DC

## 2021-05-06 NOTE — Progress Notes (Signed)
Patient came in today to have BP checked. Patient states that she was told to keep a check on BP and she has a set of readings that she took yesterday and this morning.  ? ?133/58, 155/52, 112/55, 124/59, 138/58 were her home BP readings.  ? ?BP (!) 126/53   Pulse 80  was bp reading today in our office.  ? ?I advised patient that I would send readings to PCP to review and we would call patient if any changes needed to be made.  ?

## 2021-05-07 LAB — COMPREHENSIVE METABOLIC PANEL
ALT: 11 IU/L (ref 0–32)
AST: 18 IU/L (ref 0–40)
Albumin/Globulin Ratio: 1.5 (ref 1.2–2.2)
Albumin: 4.4 g/dL (ref 3.7–4.7)
Alkaline Phosphatase: 81 IU/L (ref 44–121)
BUN/Creatinine Ratio: 23 (ref 12–28)
BUN: 26 mg/dL (ref 8–27)
Bilirubin Total: 0.3 mg/dL (ref 0.0–1.2)
CO2: 21 mmol/L (ref 20–29)
Calcium: 10.1 mg/dL (ref 8.7–10.3)
Chloride: 102 mmol/L (ref 96–106)
Creatinine, Ser: 1.11 mg/dL — ABNORMAL HIGH (ref 0.57–1.00)
Globulin, Total: 2.9 g/dL (ref 1.5–4.5)
Glucose: 132 mg/dL — ABNORMAL HIGH (ref 70–99)
Potassium: 4.7 mmol/L (ref 3.5–5.2)
Sodium: 138 mmol/L (ref 134–144)
Total Protein: 7.3 g/dL (ref 6.0–8.5)
eGFR: 52 mL/min/{1.73_m2} — ABNORMAL LOW (ref >59)

## 2021-05-07 LAB — CBC WITH DIFFERENTIAL/PLATELET
Basophils Absolute: 0.1 10*3/uL (ref 0.0–0.2)
Basos: 1 %
EOS (ABSOLUTE): 0.4 10*3/uL (ref 0.0–0.4)
Eos: 4 %
Hematocrit: 33.7 % — ABNORMAL LOW (ref 34.0–46.6)
Hemoglobin: 11 g/dL — ABNORMAL LOW (ref 11.1–15.9)
Immature Grans (Abs): 0 10*3/uL (ref 0.0–0.1)
Immature Granulocytes: 0 %
Lymphocytes Absolute: 1.9 10*3/uL (ref 0.7–3.1)
Lymphs: 17 %
MCH: 29.6 pg (ref 26.6–33.0)
MCHC: 32.6 g/dL (ref 31.5–35.7)
MCV: 91 fL (ref 79–97)
Monocytes Absolute: 0.8 10*3/uL (ref 0.1–0.9)
Monocytes: 7 %
Neutrophils Absolute: 7.8 10*3/uL — ABNORMAL HIGH (ref 1.4–7.0)
Neutrophils: 71 %
Platelets: 297 10*3/uL (ref 150–450)
RBC: 3.72 x10E6/uL — ABNORMAL LOW (ref 3.77–5.28)
RDW: 12.3 % (ref 11.7–15.4)
WBC: 11 10*3/uL — ABNORMAL HIGH (ref 3.4–10.8)

## 2021-05-07 LAB — THYROID PANEL WITH TSH
Free Thyroxine Index: 2.8 (ref 1.2–4.9)
T3 Uptake Ratio: 32 % (ref 24–39)
T4, Total: 8.8 ug/dL (ref 4.5–12.0)
TSH: 0.22 u[IU]/mL — ABNORMAL LOW (ref 0.450–4.500)

## 2021-05-07 LAB — LIPID PANEL
Chol/HDL Ratio: 2.1 ratio (ref 0.0–4.4)
Cholesterol, Total: 100 mg/dL (ref 100–199)
HDL: 47 mg/dL (ref >39)
LDL Chol Calc (NIH): 35 mg/dL (ref 0–99)
Triglycerides: 96 mg/dL (ref 0–149)
VLDL Cholesterol Cal: 18 mg/dL (ref 5–40)

## 2021-06-08 IMAGING — DX DG CHEST 2V
2 series · 2 of 2 positions shown · non-contrast
Comparison: 05/23/2020.

CLINICAL DATA: Status post CABG.

EXAM:
CHEST - 2 VIEW

[dg chest 2 view (1 of 2)]
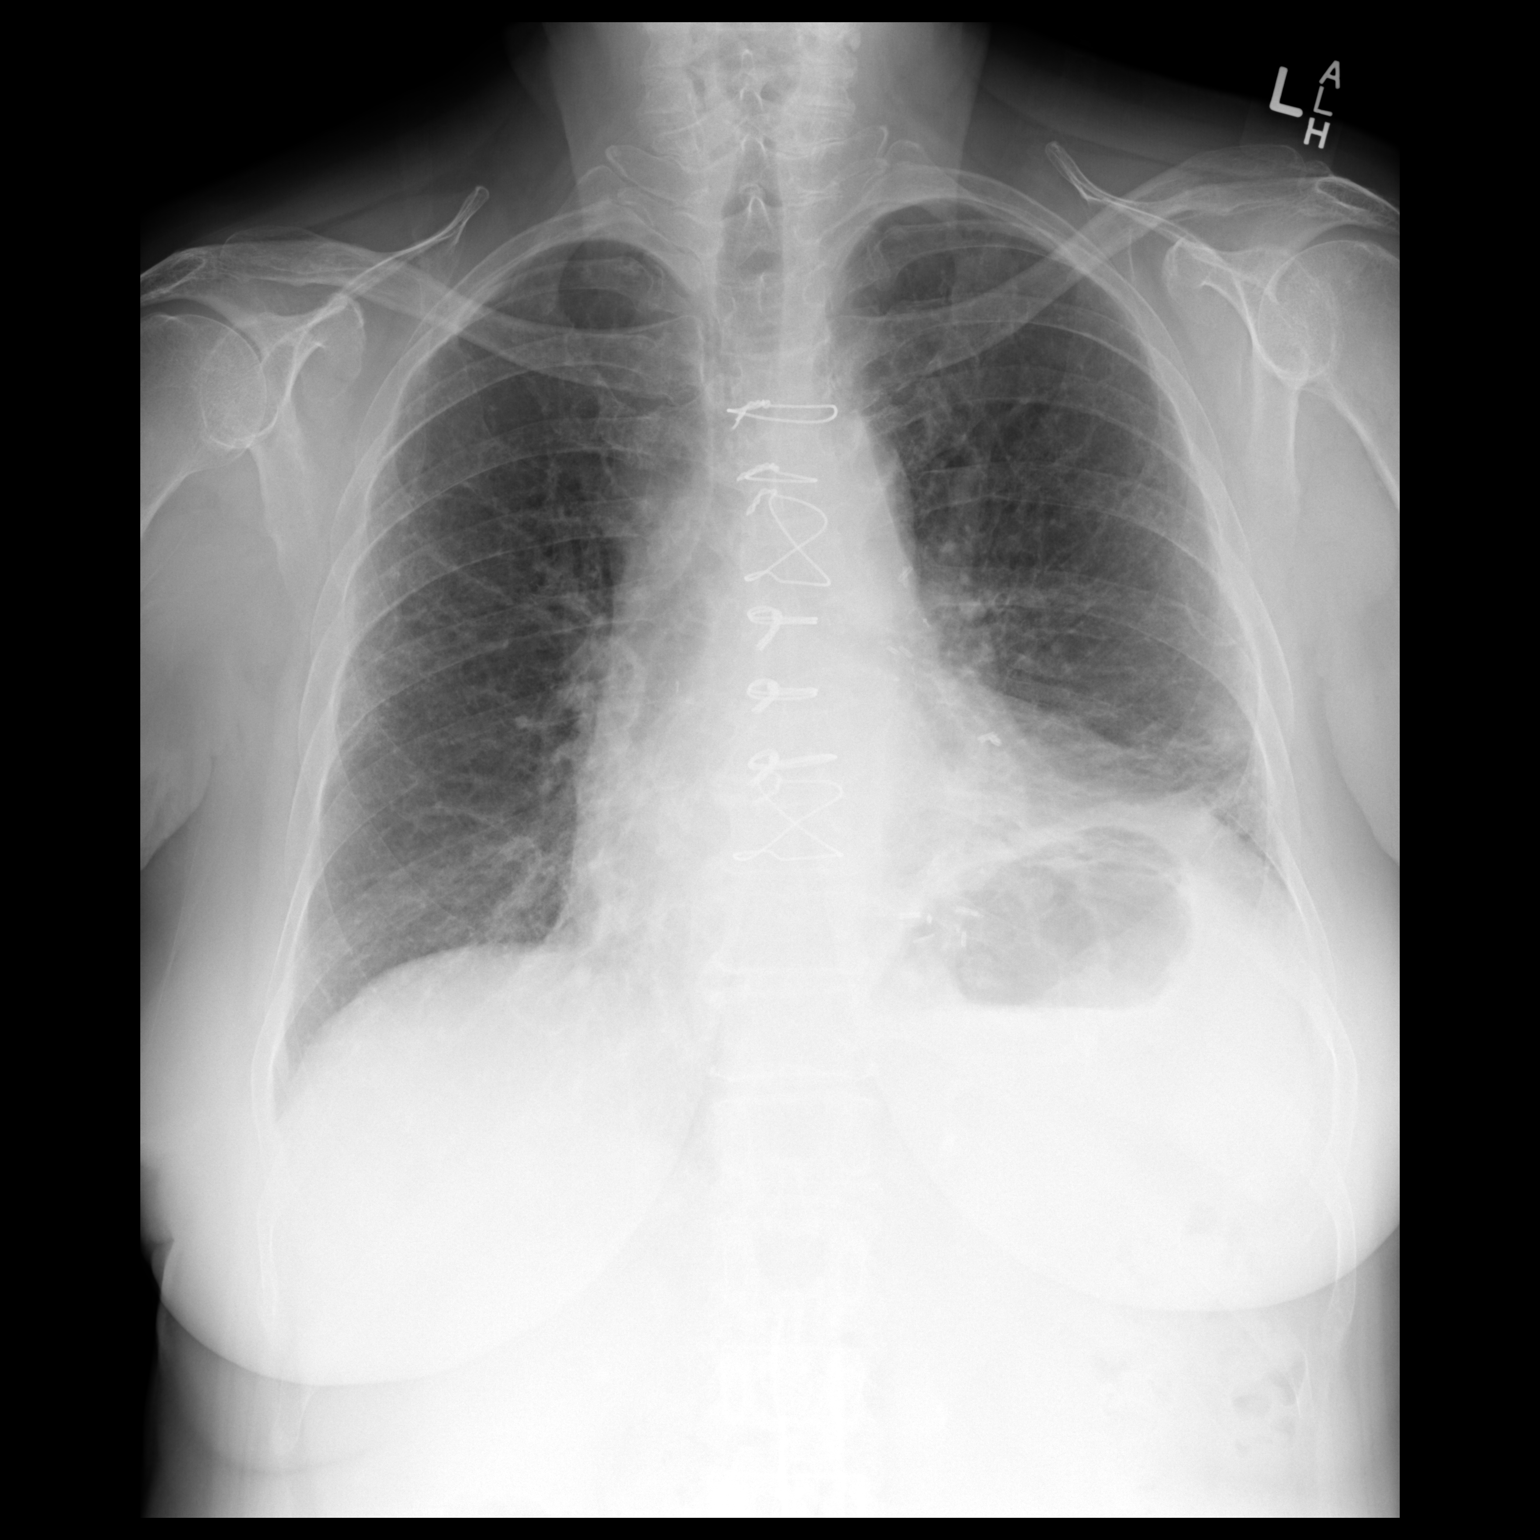

[dg chest 2 view (2 of 2)]
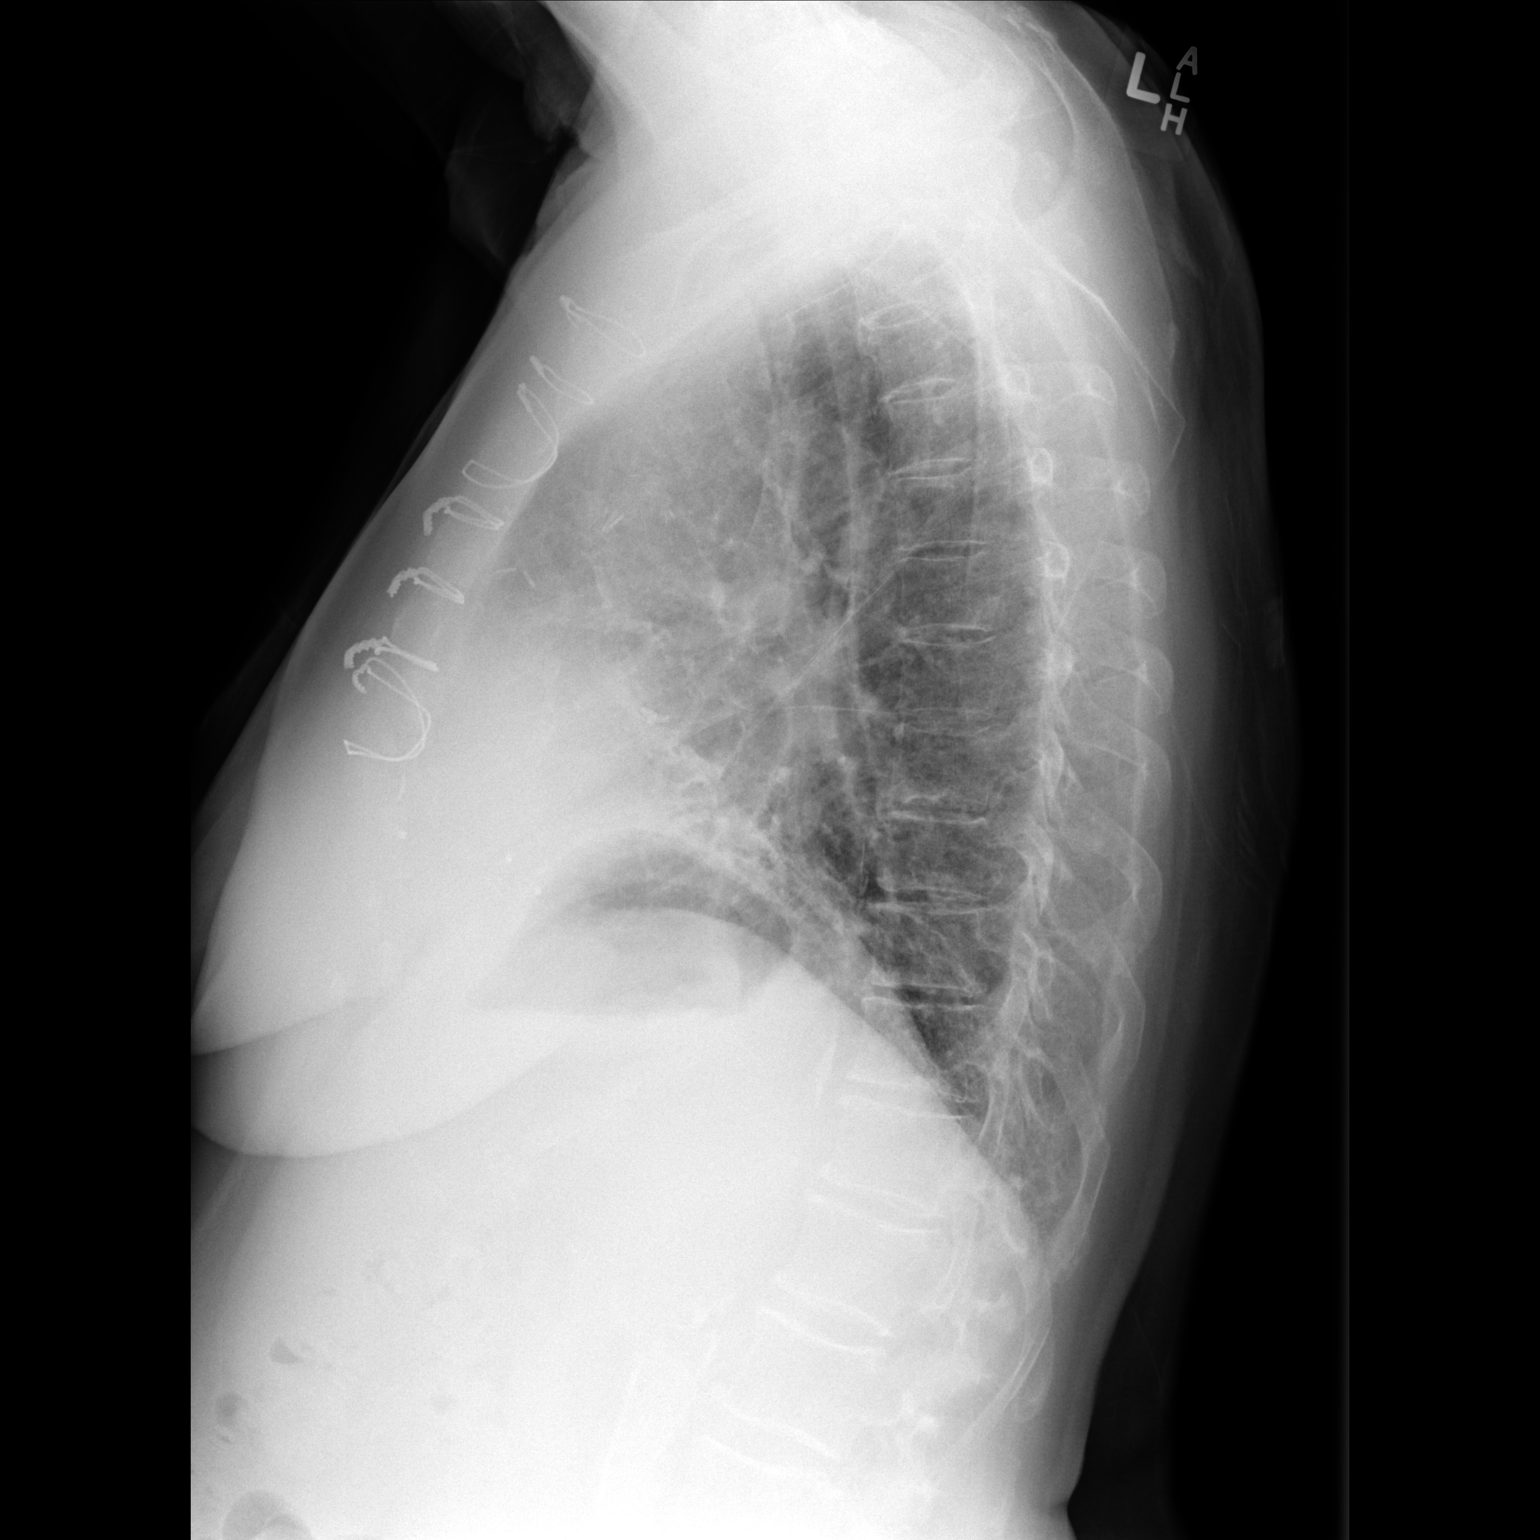

[2 of 2 positions shown; findings below may reference images not displayed]

FINDINGS: Interval removal of right PICC line. Prior CABG. Heart size normal.
Interval resolution of cardiomegaly and bilateral interstitial
prominence. Mild residual left base atelectasis/infiltrate. No
pleural effusion. No pneumothorax.
IMPRESSION: 1.  Interim removal of right PICC line.

2. Prior CABG. Heart size normal. Interim resolution of cardiomegaly
and bilateral interstitial prominence. Mild residual left base
atelectasis/infiltrate. No pleural effusion.

## 2021-06-12 ENCOUNTER — Other Ambulatory Visit: Payer: Self-pay | Admitting: Cardiology

## 2021-06-30 ENCOUNTER — Other Ambulatory Visit: Payer: Self-pay | Admitting: Nurse Practitioner

## 2021-07-01 ENCOUNTER — Ambulatory Visit (INDEPENDENT_AMBULATORY_CARE_PROVIDER_SITE_OTHER): Payer: Medicare HMO

## 2021-07-01 VITALS — Ht 62.5 in | Wt 140.0 lb

## 2021-07-01 DIAGNOSIS — Z0000 Encounter for general adult medical examination without abnormal findings: Secondary | ICD-10-CM

## 2021-07-01 NOTE — Patient Instructions (Signed)
[name] , Thank you for taking time to come for your Medicare Wellness Visit. I appreciate your ongoing commitment to your health goals. Please review the following plan we discussed and let me know if I can assist you in the future.   Screening recommendations/referrals: Colonoscopy: Declined. Mammogram: Declined. Bone Density: Declined.  Recommended yearly ophthalmology/optometry visit for glaucoma screening and checkup Recommended yearly dental visit for hygiene and checkup  Vaccinations: Influenza vaccine: Done 12/02/2020 Repeat annually  Pneumococcal vaccine: Done 07/31/14, 03/11/2016. Tdap vaccine: Done 10/03/2014 Repeat in 10 years  Shingles vaccine: Done 10/03/2007. Discussed Shingrix.   Covid-19:Done 12/02/2020, 11/28/2019, 04/10/2019 and 03/13/2019.  Advanced directives: Please bring a copy of your health care power of attorney and living will to the office to be added to your chart at your convenience.   Conditions/risks identified: KEEP UP THE GOOD WORK!!  Next appointment: Follow up in one year for your annual wellness visit 2024.   Preventive Care 10 Years and Older, Female Preventive care refers to lifestyle choices and visits with your health care provider that can promote health and wellness. What does preventive care include? A yearly physical exam. This is also called an annual well check. Dental exams once or twice a year. Routine eye exams. Ask your health care provider how often you should have your eyes checked. Personal lifestyle choices, including: Daily care of your teeth and gums. Regular physical activity. Eating a healthy diet. Avoiding tobacco and drug use. Limiting alcohol use. Practicing safe sex. Taking low-dose aspirin every day. Taking vitamin and mineral supplements as recommended by your health care provider. What happens during an annual well check? The services and screenings done by your health care provider during your annual well check will  depend on your age, overall health, lifestyle risk factors, and family history of disease. Counseling  Your health care provider may ask you questions about your: Alcohol use. Tobacco use. Drug use. Emotional well-being. Home and relationship well-being. Sexual activity. Eating habits. History of falls. Memory and ability to understand (cognition). Work and work Statistician. Reproductive health. Screening  You may have the following tests or measurements: Height, weight, and BMI. Blood pressure. Lipid and cholesterol levels. These may be checked every 5 years, or more frequently if you are over 94 years old. Skin check. Lung cancer screening. You may have this screening every year starting at age 48 if you have a 30-pack-year history of smoking and currently smoke or have quit within the past 15 years. Fecal occult blood test (FOBT) of the stool. You may have this test every year starting at age 62. Flexible sigmoidoscopy or colonoscopy. You may have a sigmoidoscopy every 5 years or a colonoscopy every 10 years starting at age 44. Hepatitis C blood test. Hepatitis B blood test. Sexually transmitted disease (STD) testing. Diabetes screening. This is done by checking your blood sugar (glucose) after you have not eaten for a while (fasting). You may have this done every 1-3 years. Bone density scan. This is done to screen for osteoporosis. You may have this done starting at age 50. Mammogram. This may be done every 1-2 years. Talk to your health care provider about how often you should have regular mammograms. Talk with your health care provider about your test results, treatment options, and if necessary, the need for more tests. Vaccines  Your health care provider may recommend certain vaccines, such as: Influenza vaccine. This is recommended every year. Tetanus, diphtheria, and acellular pertussis (Tdap, Td) vaccine. You may  need a Td booster every 10 years. Zoster vaccine. You may  need this after age 67. Pneumococcal 13-valent conjugate (PCV13) vaccine. One dose is recommended after age 79. Pneumococcal polysaccharide (PPSV23) vaccine. One dose is recommended after age 2. Talk to your health care provider about which screenings and vaccines you need and how often you need them. This information is not intended to replace advice given to you by your health care provider. Make sure you discuss any questions you have with your health care provider. Document Released: 02/14/2015 Document Revised: 10/08/2015 Document Reviewed: 11/19/2014 Elsevier Interactive Patient Education  2017 [place] Prevention in the Home Falls can cause injuries. They can happen to people of all ages. There are many things you can do to make your home safe and to help prevent falls. What can I do on the outside of my home? Regularly fix the edges of walkways and driveways and fix any cracks. Remove anything that might make you trip as you walk through a door, such as a raised step or threshold. Trim any bushes or trees on the path to your home. Use bright outdoor lighting. Clear any walking paths of anything that might make someone trip, such as rocks or tools. Regularly check to see if handrails are loose or broken. Make sure that both sides of any steps have handrails. Any raised decks and porches should have guardrails on the edges. Have any leaves, snow, or ice cleared regularly. Use sand or salt on walking paths during winter. Clean up any spills in your garage right away. This includes oil or grease spills. What can I do in the bathroom? Use night lights. Install grab bars by the toilet and in the tub and shower. Do not use towel bars as grab bars. Use non-skid mats or decals in the tub or shower. If you need to sit down in the shower, use a plastic, non-slip stool. Keep the floor dry. Clean up any water that spills on the floor as soon as it happens. Remove soap buildup in  the tub or shower regularly. Attach bath mats securely with double-sided non-slip rug tape. Do not have throw rugs and other things on the floor that can make you trip. What can I do in the bedroom? Use night lights. Make sure that you have a light by your bed that is easy to reach. Do not use any sheets or blankets that are too big for your bed. They should not hang down onto the floor. Have a firm chair that has side arms. You can use this for support while you get dressed. Do not have throw rugs and other things on the floor that can make you trip. What can I do in the kitchen? Clean up any spills right away. Avoid walking on wet floors. Keep items that you use a lot in easy-to-reach places. If you need to reach something above you, use a strong step stool that has a grab bar. Keep electrical cords out of the way. Do not use floor polish or wax that makes floors slippery. If you must use wax, use non-skid floor wax. Do not have throw rugs and other things on the floor that can make you trip. What can I do with my stairs? Do not leave any items on the stairs. Make sure that there are handrails on both sides of the stairs and use them. Fix handrails that are broken or loose. Make sure that handrails are as long as the stairways.  Check any carpeting to make sure that it is firmly attached to the stairs. Fix any carpet that is loose or worn. Avoid having throw rugs at the top or bottom of the stairs. If you do have throw rugs, attach them to the floor with carpet tape. Make sure that you have a light switch at the top of the stairs and the bottom of the stairs. If you do not have them, ask someone to add them for you. What else can I do to help prevent falls? Wear shoes that: Do not have high heels. Have rubber bottoms. Are comfortable and fit you well. Are closed at the toe. Do not wear sandals. If you use a stepladder: Make sure that it is fully opened. Do not climb a closed  stepladder. Make sure that both sides of the stepladder are locked into place. Ask someone to hold it for you, if possible. Clearly mark and make sure that you can see: Any grab bars or handrails. First and last steps. Where the edge of each step is. Use tools that help you move around (mobility aids) if they are needed. These include: Canes. Walkers. Scooters. Crutches. Turn on the lights when you go into a dark area. Replace any light bulbs as soon as they burn out. Set up your furniture so you have a clear path. Avoid moving your furniture around. If any of your floors are uneven, fix them. If there are any pets around you, be aware of where they are. Review your medicines with your doctor. Some medicines can make you feel dizzy. This can increase your chance of falling. Ask your doctor what other things that you can do to help prevent falls. This information is not intended to replace advice given to you by your health care provider. Make sure you discuss any questions you have with your health care provider. Document Released: 11/14/2008 Document Revised: 06/26/2015 Document Reviewed: 02/22/2014 Elsevier Interactive Patient Education  2017 [employer].

## 2021-07-01 NOTE — Progress Notes (Signed)
Subjective:   Tiffany White is a 76 y.o. female who presents for an Initial Medicare Annual Wellness Visit. Virtual Visit via Telephone Note  I connected with  Tiffany White on 07/01/21 at  9:45 AM EDT by telephone and verified that I am speaking with the correct person using two identifiers.  Location: Patient: HOME Provider: [facility] Persons participating in the virtual visit: patient/Nurse Health Advisor   I discussed the limitations, risks, security and privacy concerns of performing an evaluation and management service by telephone and the availability of in person appointments. The patient expressed understanding and agreed to proceed.  Interactive audio and video telecommunications were attempted between this nurse and patient, however failed, due to patient having technical difficulties OR patient did not have access to video capability.  We continued and completed visit with audio only.  Some vital signs may be absent or patient reported.   Tiffany Driver, LPN  Review of Systems     Cardiac Risk Factors include: advanced age (>54mn, >>81women);diabetes mellitus;hypertension;dyslipidemia;sedentary lifestyle     Objective:    Today's Vitals   07/01/21 0945  Weight: 140 lb (63.5 kg)  Height: 5' 2.5" (1.588 m)   Body mass index is 25.2 kg/m.     07/01/2021    9:56 AM 05/13/2020    3:42 AM 05/12/2020    4:32 PM  Advanced Directives  Does Patient Have a Medical Advance Directive? Yes  No  Type of [place] will    Copy of [place] Chart? No - copy requested    Would patient like information on creating a medical advance directive?  No - Patient declined     Current Medications (verified) Outpatient Encounter Medications as of 07/01/2021  Medication Sig   ACCU-CHEK GUIDE test strip CHECK BLOOD SUGAR TWICE DAILY   Accu-Chek Softclix Lancets lancets CHECK BLOOD SUGAR TWICE DAILY   amLODipine  (NORVASC) 5 MG tablet TAKE ONE TABLET ONCE DAILY   aspirin EC 81 MG tablet Take 1 tablet (81 mg total) by mouth daily. Swallow whole.   Cholecalciferol 25 MCG (1000 UT) capsule Take 2,000 Units by mouth daily.   cyanocobalamin 1000 MCG tablet Take 1,000 mcg by mouth daily.   ferrous sulfate (FE TABS) 325 (65 FE) MG EC tablet Take 1 tablet (325 mg total) by mouth daily with breakfast.   GNP ULTICARE PEN NEEDLES 32G X 4 MM MISC USE AS DIRECTED 3 TIMES A DAY   insulin aspart (NOVOLOG) 100 UNIT/ML injection Inject 14 Units into the skin 2 (two) times daily.   levothyroxine (SYNTHROID) 75 MCG tablet TAKE ONE TABLET ONCE DAILY   metFORMIN (GLUCOPHAGE) 1000 MG tablet TAKE ONE TABLET TWICE DAILY WITH MEAL(S)   metoprolol tartrate (LOPRESSOR) 50 MG tablet Take 1 tablet (50 mg total) by mouth 2 (two) times daily.   NOVOLOG MIX 70/30 FLEXPEN (70-30) 100 UNIT/ML FlexPen Inject into the skin.   Omega-3 Fatty Acids (FISH OIL) 1000 MG CAPS Take 1 capsule by mouth daily.   polyethylene glycol (MIRALAX / GLYCOLAX) 17 g packet Take 17 g by mouth daily.   rosuvastatin (CRESTOR) 20 MG tablet Take 1 tablet (20 mg total) by mouth daily.   senna (SENOKOT) 8.6 MG tablet Take 1 tablet by mouth daily.   No facility-administered encounter medications on file as of 07/01/2021.    Allergies (verified) Patient has no known allergies.   History: Past Medical History:  Diagnosis Date   CAD (  coronary artery disease)    Diabetes mellitus without complication ([place])    Hyperlipidemia    Thyroid disease    Past Surgical History:  Procedure Laterality Date   ABDOMINAL HYSTERECTOMY     APPENDECTOMY     CORONARY ARTERY BYPASS GRAFT N/A 05/17/2020   Procedure: CORONARY ARTERY BYPASS GRAFTING (CABG) TIMES FOUR, ON PUMP, USING LEFT INTERNAL MAMMARY ARTERY AND ENDOSCOPICALLY HARVESTED RIGHT GREATER SAPHENOUS VEIN;  Surgeon: Wonda Olds, MD;  Location: [place];  Service: Open Heart Surgery;  Laterality: N/A;   ENDOVEIN  HARVEST OF GREATER SAPHENOUS VEIN Right 05/17/2020   Procedure: ENDOVEIN HARVEST OF GREATER SAPHENOUS VEIN;  Surgeon: Wonda Olds, MD;  Location: [place];  Service: Open Heart Surgery;  Laterality: Right;   IR THORACENTESIS ASP PLEURAL SPACE W/IMG GUIDE  05/22/2020   LEFT HEART CATH AND CORONARY ANGIOGRAPHY N/A 05/14/2020   Procedure: LEFT HEART CATH AND CORONARY ANGIOGRAPHY;  Surgeon: Troy Sine, MD;  Location: [facility];  Service: Cardiovascular;  Laterality: N/A;   TEE WITHOUT CARDIOVERSION N/A 05/17/2020   Procedure: TRANSESOPHAGEAL ECHOCARDIOGRAM (TEE);  Surgeon: Wonda Olds, MD;  Location: [facility];  Service: Open Heart Surgery;  Laterality: N/A;   TONSILLECTOMY     TONSILLECTOMY     Family History  Problem Relation Age of Onset   CAD Father    CAD Brother    CAD Paternal 74    Social History   Socioeconomic History   Marital status: Widowed    Spouse name: Not on file   Number of children: 1   Years of education: Not on file   Highest education level: Not on file  Occupational History   Not on file  Tobacco Use   Smoking status: Former    Passive exposure: Never   Smokeless tobacco: Never  Vaping Use   Vaping Use: Never used  Substance and Sexual Activity   Alcohol use: Not Currently   Drug use: Never   Sexual activity: Not on file  Other Topics Concern   Not on file  Social History Narrative   Widowed since 2019.   1 daughter, sees weekly.    Social Determinants of Health   Financial Resource Strain: Low Risk    Difficulty of Paying Living Expenses: Not hard at all  Food Insecurity: No Food Insecurity   Worried About Charity fundraiser in the Last Year: Never true   [place] in the Last Year: Never true  Transportation Needs: No Transportation Needs   Lack of Transportation (Medical): No   Lack of Transportation (Non-Medical): No  Physical Activity: Sufficiently Active   Days of Exercise per Week: 7 days   Minutes of  Exercise per Session: 30 min  Stress: No Stress Concern Present   Feeling of Stress : Not at all  Social Connections: Socially Isolated   Frequency of Communication with Friends and Family: More than three times a week   Frequency of Social Gatherings with Friends and Family: Once a week   Attends Religious Services: Never   Marine scientist or Organizations: No   Attends Archivist Meetings: Never   Marital Status: Widowed    Tobacco Counseling Counseling given: Not Answered   Clinical Intake:  Pre-visit preparation completed: Yes  Pain : No/denies pain     BMI - recorded: 25.2 Nutritional Status: BMI 25 -29 Overweight Nutritional Risks: None Diabetes: Yes  How often do you need to have someone help  you when you read instructions, pamphlets, or other written materials from your doctor or pharmacy?: 1 - Never  Diabetic?Nutrition Risk Assessment:  Has the patient had any N/V/D within the last 2 months?  No  Does the patient have any non-healing wounds?  No  Has the patient had any unintentional weight loss or weight gain?  No   Diabetes:  Is the patient diabetic?  Yes  If diabetic, was a CBG obtained today?  No  Did the patient bring in their glucometer from home?  No  How often do you monitor your CBG's? 1x per week.   Financial Strains and Diabetes Management:  Are you having any financial strains with the device, your supplies or your medication? No .  Does the patient want to be seen by Chronic Care Management for management of their diabetes?  No  Would the patient like to be referred to a Nutritionist or for Diabetic Management?  No   Diabetic Exams:  Diabetic Eye Exam: Completed 07/22/2020. Overdue for diabetic eye exam. Pt has been advised about the importance in completing this exam. A referral has been placed today. Message sent to referral coordinator for scheduling purposes. Advised pt to expect a call from office referred to regarding  appt.  Diabetic Foot Exam: Completed 05/05/2021. Pt has been advised about the importance in completing this exam.   Interpreter Needed?: No  Information entered by :: mj Hobert Poplaski, lpn   Activities of Daily Living    07/01/2021    9:59 AM  In your present state of health, do you have any difficulty performing the following activities:  Hearing? 0  Vision? 0  Difficulty concentrating or making decisions? 0  Walking or climbing stairs? 0  Dressing or bathing? 0  Doing errands, shopping? 0  Preparing Food and eating ? N  Using the Toilet? N  In the past six months, have you accidently leaked urine? Y  Do you have problems with loss of bowel control? N  Managing your Medications? N  Managing your Finances? N  Housekeeping or managing your Housekeeping? N    Patient Care Team: Ivy Lynn, NP as PCP - General (Nurse Practitioner) Minus Breeding, MD as PCP - Cardiology (Cardiology)  Indicate any recent Medical Services you may have received from other than [facility] providers in the past year (date may be approximate).     Assessment:   This is a routine wellness examination for Tiffany White.  Hearing/Vision screen Hearing Screening - Comments:: No hearing issues.  Vision Screening - Comments:: Glasses to read. [facility] [place], 07/23/2021.  Dietary issues and exercise activities discussed: Current Exercise Habits: Home exercise routine, Type of exercise: Other - see comments (stationary bike.), Time (Minutes): 30, Frequency (Times/Week): 7, Weekly Exercise (Minutes/Week): 210, Intensity: Mild, Exercise limited by: cardiac condition(s)   Goals Addressed             This Visit's Progress    Exercise 3x per week (30 min per time)       Continue to stay active and healthy.        Depression Screen    07/01/2021    9:51 AM  PHQ 2/9 Scores  PHQ - 2 Score 0    Fall Risk    07/01/2021    9:56 AM  [place] in the past year? 0  Number falls in past yr: 0   Injury with Fall? 0  Risk for fall due to : No Fall  Risks  Follow up Falls prevention discussed    FALL RISK PREVENTION PERTAINING TO THE HOME:  Any stairs in or around the home? No  If so, are there any without handrails? No  Home free of loose throw rugs in walkways, pet beds, electrical cords, etc? Yes  Adequate lighting in your home to reduce risk of falls? Yes   ASSISTIVE DEVICES UTILIZED TO PREVENT FALLS:  Life alert? No  Use of a cane, walker or w/c? No  Grab bars in the bathroom? No  Shower chair or bench in shower? No  Elevated toilet seat or a handicapped toilet? Yes   TIMED UP AND GO:  Was the test performed? No .  Phone visit.   Cognitive Function:        07/01/2021   10:00 AM  6CIT Screen  What Year? 0 points  What month? 0 points  What time? 0 points  Count back from 20 0 points  Months in reverse 0 points  Repeat phrase 0 points  Total Score 0 points    Immunizations Immunization History  Administered Date(s) Administered   DT (Pediatric) 04/24/2002   Fluad Quad(high Dose 65+) 11/08/2016, 11/09/2017, 12/02/2020   Influenza, High Dose Seasonal PF 10/08/2015, 11/13/2018   Influenza, Seasonal, Injecte, Preservative Fre 01/01/1999, 12/12/1999, 12/10/2000, 11/15/2001, 11/12/2002, 01/21/2004, 01/28/2004, 10/20/2006, 11/01/2014   Influenza,inj,Quad PF,6+ Mos 11/28/2019   Influenza-Unspecified 01/01/1999, 12/12/1999, 12/10/2000, 11/15/2001, 11/12/2002, 01/21/2004, 01/28/2004, 10/20/2006, 10/11/2013   Moderna Covid-19 Vaccine Bivalent Booster 75yr & up 12/02/2020   Moderna Sars-Covid-2 Vaccination 03/13/2019, 04/10/2019, 11/28/2019   Pneumococcal Conjugate-13 07/31/2014   Pneumococcal Polysaccharide-23 04/24/2002, 03/11/2016   Pneumococcal-Unspecified 04/24/2002   Tdap 10/03/2014   Zoster, Live 10/03/2007    TDAP status: Up to date  Flu Vaccine status: Up to date  Pneumococcal vaccine status: Up to date  Covid-19 vaccine status: Completed  vaccines  Qualifies for Shingles Vaccine? Yes   Zostavax completed Yes   Shingrix Completed?: No.    Education has been provided regarding the importance of this vaccine. Patient has been advised to call insurance company to determine out of pocket expense if they have not yet received this vaccine. Advised may also receive vaccine at local pharmacy or Health Dept. Verbalized acceptance and understanding.  Screening Tests Health Maintenance  Topic Date Due   URINE MICROALBUMIN  Never done   HEMOGLOBIN A1C  11/12/2020   Zoster Vaccines- Shingrix (1 of 2) 08/04/2021 (Originally 03/12/1964)   OPHTHALMOLOGY EXAM  05/03/2022 (Originally 03/13/1955)   Hepatitis C Screening  05/06/2022 (Originally 03/13/1963)   DEXA SCAN  07/02/2022 (Originally 03/12/2010)   INFLUENZA VACCINE  09/01/2021   FOOT EXAM  05/06/2022   TETANUS/TDAP  10/02/2024   Pneumonia Vaccine 76 Years old  Completed   COVID-19 Vaccine  Completed   HPV VACCINES  Aged Out    Health Maintenance  Health Maintenance Due  Topic Date Due   URINE MICROALBUMIN  Never done   HEMOGLOBIN A1C  11/12/2020    Colorectal cancer screening: No longer required.   Mammogram status: No longer required due to age.  Bone Density status: Ordered pt declined. Pt provided with contact info and advised to call to schedule appt.  Lung Cancer Screening: (Low Dose CT Chest recommended if Age 76-80years, 30 pack-year currently smoking OR have quit w/in 15years.) does not qualify.   Additional Screening:  Hepatitis C Screening: does qualify; Completed Postponed until 2024.  Vision Screening: Recommended annual ophthalmology exams for early detection of glaucoma and other disorders  of the eye. Is the patient up to date with their annual eye exam?  Yes  Who is the provider or what is the name of the office in which the patient attends annual eye exams? [facility] If pt is not established with a provider, would they like to be referred to a  provider to establish care? No .   Dental Screening: Recommended annual dental exams for proper oral hygiene  Community Resource Referral / Chronic Care Management: CRR required this visit?  No   CCM required this visit?  No      Plan:     I have personally reviewed and noted the following in the patient's chart:   Medical and social history Use of alcohol, tobacco or illicit drugs  Current medications and supplements including opioid prescriptions. Patient is not currently taking opioid prescriptions. Functional ability and status Nutritional status Physical activity Advanced directives List of other physicians Hospitalizations, surgeries, and ER visits in previous 12 months Vitals Screenings to include cognitive, depression, and falls Referrals and appointments  In addition, I have reviewed and discussed with patient certain preventive protocols, quality metrics, and best practice recommendations. A written personalized care plan for preventive services as well as general preventive health recommendations were provided to patient.     Tiffany Driver, LPN   [id number]/[id number]   Nurse Notes: Discussed Mammogram, Colonoscopy and DEXA, pt declines repeat at this time. Discussed Shingrix and how to obtain.

## 2021-08-05 ENCOUNTER — Ambulatory Visit (INDEPENDENT_AMBULATORY_CARE_PROVIDER_SITE_OTHER): Payer: Medicare HMO

## 2021-08-05 ENCOUNTER — Encounter: Payer: Self-pay | Admitting: Nurse Practitioner

## 2021-08-05 ENCOUNTER — Ambulatory Visit (INDEPENDENT_AMBULATORY_CARE_PROVIDER_SITE_OTHER): Payer: Medicare HMO | Admitting: Nurse Practitioner

## 2021-08-05 VITALS — BP 150/59 | HR 71 | Temp 98.4°F | Ht 62.0 in | Wt 141.0 lb

## 2021-08-05 DIAGNOSIS — E039 Hypothyroidism, unspecified: Secondary | ICD-10-CM

## 2021-08-05 DIAGNOSIS — E559 Vitamin D deficiency, unspecified: Secondary | ICD-10-CM | POA: Diagnosis not present

## 2021-08-05 DIAGNOSIS — E1169 Type 2 diabetes mellitus with other specified complication: Secondary | ICD-10-CM | POA: Diagnosis not present

## 2021-08-05 DIAGNOSIS — I10 Essential (primary) hypertension: Secondary | ICD-10-CM

## 2021-08-05 DIAGNOSIS — E781 Pure hyperglyceridemia: Secondary | ICD-10-CM

## 2021-08-05 DIAGNOSIS — Z780 Asymptomatic menopausal state: Secondary | ICD-10-CM

## 2021-08-05 DIAGNOSIS — E782 Mixed hyperlipidemia: Secondary | ICD-10-CM

## 2021-08-05 LAB — BAYER DCA HB A1C WAIVED: HB A1C (BAYER DCA - WAIVED): 5.7 % — ABNORMAL HIGH (ref 4.8–5.6)

## 2021-08-05 NOTE — Assessment & Plan Note (Signed)
Patient's diabetes is well controlled.  A1c 5.7 from 7 patient is compliant and eating a healthy diabetic diet with exercise and calorie counting.  Follow-up in 3 months.

## 2021-08-05 NOTE — Assessment & Plan Note (Signed)
Hypertension well-controlled on current medication no changes necessary.  Patient reports blood pressure elevated in clinic today which is normal for her every time she comes in for a visit.  Patient reports blood pressure is 120-130/70 at home.  Completed labs-CBC, CMP, lipid panel, follow-up with worsening unresolved symptoms.

## 2021-08-05 NOTE — Assessment & Plan Note (Signed)
No new signs and symptoms with health cholesteremia.  Completed lipid panel results pending.  Continue low-cholesterol diet and exercise.

## 2021-08-05 NOTE — Patient Instructions (Signed)
Hypertension, Adult Hypertension is another name for high blood pressure. High blood pressure forces your heart to work harder to pump blood. This can cause problems over time. There are two numbers in a blood pressure reading. There is a top number (systolic) over a bottom number (diastolic). It is best to have a blood pressure that is below 120/80. What are the causes? The cause of this condition is not known. Some other conditions can lead to high blood pressure. What increases the risk? Some lifestyle factors can make you more likely to develop high blood pressure: Smoking. Not getting enough exercise or physical activity. Being overweight. Having too much fat, sugar, calories, or salt (sodium) in your diet. Drinking too much alcohol. Other risk factors include: Having any of these conditions: Heart disease. Diabetes. High cholesterol. Kidney disease. Obstructive sleep apnea. Having a family history of high blood pressure and high cholesterol. Age. The risk increases with age. Stress. What are the signs or symptoms? High blood pressure may not cause symptoms. Very high blood pressure (hypertensive crisis) may cause: Headache. Fast or uneven heartbeats (palpitations). Shortness of breath. Nosebleed. Vomiting or feeling like you may vomit (nauseous). Changes in how you see. Very bad chest pain. Feeling dizzy. Seizures. How is this treated? This condition is treated by making healthy lifestyle changes, such as: Eating healthy foods. Exercising more. Drinking less alcohol. Your doctor may prescribe medicine if lifestyle changes do not help enough and if: Your top number is above 130. Your bottom number is above 80. Your personal target blood pressure may vary. Follow these instructions at home: Eating and drinking  If told, follow the DASH eating plan. To follow this plan: Fill one half of your plate at each meal with fruits and vegetables. Fill one fourth of your plate  at each meal with whole grains. Whole grains include whole-wheat pasta, brown rice, and whole-grain bread. Eat or drink low-fat dairy products, such as skim milk or low-fat yogurt. Fill one fourth of your plate at each meal with low-fat (lean) proteins. Low-fat proteins include fish, chicken without skin, eggs, beans, and tofu. Avoid fatty meat, cured and processed meat, or chicken with skin. Avoid pre-made or processed food. Limit the amount of salt in your diet to less than 1,500 mg each day. Do not drink alcohol if: Your doctor tells you not to drink. You are pregnant, may be pregnant, or are planning to become pregnant. If you drink alcohol: Limit how much you have to: 0-1 drink a day for women. 0-2 drinks a day for men. Know how much alcohol is in your drink. In the [place] one drink equals one 12 oz bottle of beer (355 mL), one 5 oz glass of wine (148 mL), or one 1 oz glass of hard liquor (44 mL). Lifestyle  Work with your doctor to stay at a healthy weight or to lose weight. Ask your doctor what the best weight is for you. Get at least 30 minutes of exercise that causes your heart to beat faster (aerobic exercise) most days of the week. This may include walking, swimming, or biking. Get at least 30 minutes of exercise that strengthens your muscles (resistance exercise) at least 3 days a week. This may include lifting weights or doing Pilates. Do not smoke or use any products that contain nicotine or tobacco. If you need help quitting, ask your doctor. Check your blood pressure at home as told by your doctor. Keep all follow-up visits. Medicines Take over-the-counter and prescription medicines  only as told by your doctor. Follow directions carefully. Do not skip doses of blood pressure medicine. The medicine does not work as well if you skip doses. Skipping doses also puts you at risk for problems. Ask your doctor about side effects or reactions to medicines that you should watch  for. Contact a doctor if: You think you are having a reaction to the medicine you are taking. You have headaches that keep coming back. You feel dizzy. You have swelling in your ankles. You have trouble with your vision. Get help right away if: You get a very bad headache. You start to feel mixed up (confused). You feel weak or numb. You feel faint. You have very bad pain in your: Chest. Belly (abdomen). You vomit more than once. You have trouble breathing. These symptoms may be an emergency. Get help right away. Call 911. Do not wait to see if the symptoms will go away. Do not drive yourself to the hospital. Summary Hypertension is another name for high blood pressure. High blood pressure forces your heart to work harder to pump blood. For most people, a normal blood pressure is less than 120/80. Making healthy choices can help lower blood pressure. If your blood pressure does not get lower with healthy choices, you may need to take medicine. This information is not intended to replace advice given to you by your health care provider. Make sure you discuss any questions you have with your health care provider. Document Revised: 11/06/2020 Document Reviewed: 11/06/2020 Elsevier Patient Education  [place]. Diabetes Mellitus Basics  Diabetes mellitus, or diabetes, is a long-term (chronic) disease. It occurs when the body does not properly use sugar (glucose) that is released from food after you eat. Diabetes mellitus may be caused by one or both of these problems: Your pancreas does not make enough of a hormone called insulin. Your body does not react in a normal way to the insulin that it makes. Insulin lets glucose enter cells in your body. This gives you energy. If you have diabetes, glucose cannot get into cells. This causes high blood glucose (hyperglycemia). How to treat and manage diabetes You may need to take insulin or other diabetes medicines daily to keep your  glucose in balance. If you are prescribed insulin, you will learn how to give yourself insulin by injection. You may need to adjust the amount of insulin you take based on the foods that you eat. You will need to check your blood glucose levels using a glucose monitor as told by your health care provider. The readings can help determine if you have low or high blood glucose. Generally, you should have these blood glucose levels: Before meals (preprandial): 80-130 mg/dL (4.4-7.2 mmol/L). After meals (postprandial): below 180 mg/dL (10 mmol/L). Hemoglobin A1c (HbA1c) level: less than 7%. Your health care provider will set treatment goals for you. Keep all follow-up visits. This is important. Follow these instructions at home: Diabetes medicines Take your diabetes medicines every day as told by your health care provider. List your diabetes medicines here: Name of medicine: ______________________________ Amount (dose): _______________ Time (a.m./p.m.): _______________ Notes: ___________________________________ Name of medicine: ______________________________ Amount (dose): _______________ Time (a.m./p.m.): _______________ Notes: ___________________________________ Name of medicine: ______________________________ Amount (dose): _______________ Time (a.m./p.m.): _______________ Notes: ___________________________________ Insulin If you use insulin, list the types of insulin you use here: Insulin type: ______________________________ Amount (dose): _______________ Time (a.m./p.m.): _______________Notes: ___________________________________ Insulin type: ______________________________ Amount (dose): _______________ Time (a.m./p.m.): _______________ Notes: ___________________________________ Insulin type: ______________________________ Amount (dose):  _______________ Time (a.m./p.m.): _______________ Notes: ___________________________________ Insulin type: ______________________________ Amount (dose):  _______________ Time (a.m./p.m.): _______________ Notes: ___________________________________ Insulin type: ______________________________ Amount (dose): _______________ Time (a.m./p.m.): _______________ Notes: ___________________________________ Managing blood glucose  Check your blood glucose levels using a glucose monitor as told by your health care provider. Write down the times that you check your glucose levels here: Time: _______________ Notes: ___________________________________ Time: _______________ Notes: ___________________________________ Time: _______________ Notes: ___________________________________ Time: _______________ Notes: ___________________________________ Time: _______________ Notes: ___________________________________ Time: _______________ Notes: ___________________________________  Low blood glucose Low blood glucose (hypoglycemia) is when glucose is at or below 70 mg/dL (3.9 mmol/L). Symptoms may include: Feeling: Hungry. Sweaty and clammy. Irritable or easily upset. Dizzy. Sleepy. Having: A fast heartbeat. A headache. A change in your vision. Numbness around the mouth, lips, or tongue. Having trouble with: Moving (coordination). Sleeping. Treating low blood glucose To treat low blood glucose, eat or drink something containing sugar right away. If you can think clearly and swallow safely, follow the 15:15 rule: Take 15 grams of a fast-acting carb (carbohydrate), as told by your health care provider. Some fast-acting carbs are: Glucose tablets: take 3-4 tablets. Hard candy: eat 3-5 pieces. Fruit juice: drink 4 oz (120 mL). Regular (not diet) soda: drink 4-6 oz (120-180 mL). Honey or sugar: eat 1 Tbsp (15 mL). Check your blood glucose levels 15 minutes after you take the carb. If your glucose is still at or below 70 mg/dL (3.9 mmol/L), take 15 grams of a carb again. If your glucose does not go above 70 mg/dL (3.9 mmol/L) after 3 tries, get help right  away. After your glucose goes back to normal, eat a meal or a snack within 1 hour. Treating very low blood glucose If your glucose is at or below 54 mg/dL (3 mmol/L), you have very low blood glucose (severe hypoglycemia). This is an emergency. Do not wait to see if the symptoms will go away. Get medical help right away. Call your local emergency services (911 in the [place] Do not drive yourself to the hospital. Questions to ask your health care provider Should I talk with a diabetes educator? What equipment will I need to care for myself at home? What diabetes medicines do I need? When should I take them? How often do I need to check my blood glucose levels? What number can I call if I have questions? When is my follow-up visit? Where can I find a support group for people with diabetes? Where to find more information American Diabetes Association: www.diabetes.org Association of Diabetes Care and Education Specialists: www.diabeteseducator.org Contact a health care provider if: Your blood glucose is at or above 240 mg/dL (13.3 mmol/L) for 2 days in a row. You have been sick or have had a fever for 2 days or more, and you are not getting better. You have any of these problems for more than 6 hours: You cannot eat or drink. You feel nauseous. You vomit. You have diarrhea. Get help right away if: Your blood glucose is lower than 54 mg/dL (3 mmol/L). You get confused. You have trouble thinking clearly. You have trouble breathing. These symptoms may represent a serious problem that is an emergency. Do not wait to see if the symptoms will go away. Get medical help right away. Call your local emergency services (911 in the [place] Do not drive yourself to the hospital. Summary Diabetes mellitus is a chronic disease that occurs when the body does not properly use sugar (glucose) that is released from food after you  eat. Take insulin and diabetes medicines as told. Check your blood glucose  every day, as often as told. Keep all follow-up visits. This is important. This information is not intended to replace advice given to you by your health care provider. Make sure you discuss any questions you have with your health care provider. Document Revised: 05/22/2019 Document Reviewed: 05/22/2019 Elsevier Patient Education  [place].

## 2021-08-05 NOTE — Assessment & Plan Note (Signed)
Completed vitamin D labs results pending.

## 2021-08-05 NOTE — Progress Notes (Signed)
Established Patient Office Visit  Subjective   Patient ID: Tiffany White, female    DOB: [birth year]  Age: 76 y.o. MRN: [id number]  Chief Complaint  Patient presents with   Medical Management of Chronic Issues    3 month     HPI  Patient presents for follow up of hypertension. Patient was diagnosed in 08/27/2019. The patient is tolerating the medication well without side effects. Compliance with treatment has been good; including taking medication as directed , maintains a healthy diet and regular exercise regimen , and following up as directed. Current medication: Amlodipine 5 mg tablet by mouth daily, Metoprolol  Mixed hyperlipidemia  Patient presents with hyperlipidemia. Patient was diagnosed .Compliance with treatment has been good The patient is compliant with medications, maintains a low cholesterol diet , follows up as directed , and maintains an exercise regimen . The patient denies experiencing any hypercholesterolemia related symptoms.    Thyroid: Patient presents for evaluation of hypothyroidism. Current symptoms include denies fatigue, weight changes, heat/cold intolerance, bowel/skin changes or CVS symptoms. Patient denies weight gain.     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   Angina pectoris ([place]) 05/14/2020   Coronary artery disease involving native coronary artery of native heart with unstable angina pectoris ([place])    Chest pain 05/13/2020   Diabetes mellitus, type II ([place]) 05/13/2020   Elevated troponin I level 05/13/2020   Hyperglycemia due to diabetes mellitus ([place]) 05/13/2020   Vitamin B12 deficiency 05/13/2020   Chronic kidney disease, stage 3a ([place]) 08/27/2019   Essential hypertension 08/27/2019   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[place]) 07/12/2016   Hypothyroidism 04/28/2016   Symptomatic menopausal or female climacteric states 03/21/2014    Hypertriglyceridemia 10/03/2007   Vitamin D deficiency 10/20/2006   Other specified disorders of cartilage, unspecified sites 10/13/2006   Herpes zoster 06/30/2001   Benign neoplasm of colon 09/02/1998   Acute pancreatitis 04/02/1991   Past Medical History:  Diagnosis Date   CAD (coronary artery disease)    Diabetes mellitus without complication ([place])    Hyperlipidemia    Thyroid disease    Past Surgical History:  Procedure Laterality Date   ABDOMINAL HYSTERECTOMY     APPENDECTOMY     CORONARY ARTERY BYPASS GRAFT N/A 05/17/2020   Procedure: CORONARY ARTERY BYPASS GRAFTING (CABG) TIMES FOUR, ON PUMP, USING LEFT INTERNAL MAMMARY ARTERY AND ENDOSCOPICALLY HARVESTED RIGHT GREATER SAPHENOUS VEIN;  Surgeon: Wonda Olds, MD;  Location: [facility];  Service: Open Heart Surgery;  Laterality: N/A;   ENDOVEIN HARVEST OF GREATER SAPHENOUS VEIN Right 05/17/2020   Procedure: ENDOVEIN HARVEST OF GREATER SAPHENOUS VEIN;  Surgeon: Wonda Olds, MD;  Location: [facility];  Service: Open Heart Surgery;  Laterality: Right;   IR THORACENTESIS ASP PLEURAL SPACE W/IMG GUIDE  05/22/2020   LEFT HEART CATH AND CORONARY ANGIOGRAPHY N/A 05/14/2020   Procedure: LEFT HEART CATH AND CORONARY ANGIOGRAPHY;  Surgeon: Troy Sine, MD;  Location: [facility];  Service: Cardiovascular;  Laterality: N/A;   TEE WITHOUT CARDIOVERSION N/A 05/17/2020   Procedure: TRANSESOPHAGEAL ECHOCARDIOGRAM (TEE);  Surgeon: Wonda Olds, MD;  Location: [facility];  Service: Open Heart Surgery;  Laterality: N/A;   TONSILLECTOMY     TONSILLECTOMY     Social History   Tobacco Use   Smoking status: Former    Passive exposure: Never   Smokeless  tobacco: Never  Vaping Use   Vaping Use: Never used  Substance Use Topics   Alcohol use: Not Currently   Drug use: Never   Social History   Socioeconomic History   Marital status: Widowed    Spouse name: Not on file   Number of children: 1   Years of education: Not on file    Highest education level: Not on file  Occupational History   Not on file  Tobacco Use   Smoking status: Former    Passive exposure: Never   Smokeless tobacco: Never  Vaping Use   Vaping Use: Never used  Substance and Sexual Activity   Alcohol use: Not Currently   Drug use: Never   Sexual activity: Not on file  Other Topics Concern   Not on file  Social History Narrative   Widowed since 2019.   1 daughter, sees weekly.    Social Determinants of Health   Financial Resource Strain: Low Risk  (07/01/2021)   Overall Financial Resource Strain (CARDIA)    Difficulty of Paying Living Expenses: Not hard at all  Food Insecurity: No Food Insecurity (07/01/2021)   Hunger Vital Sign    Worried About Running Out of Food in the Last Year: Never true    Ran Out of Food in the Last Year: Never true  Transportation Needs: No Transportation Needs (07/01/2021)   PRAPARE - Hydrologist (Medical): No    Lack of Transportation (Non-Medical): No  Physical Activity: Sufficiently Active (07/01/2021)   Exercise Vital Sign    Days of Exercise per Week: 7 days    Minutes of Exercise per Session: 30 min  Stress: No Stress Concern Present (07/01/2021)   Harmony    Feeling of Stress : Not at all  Social Connections: Socially Isolated (07/01/2021)   Social Connection and Isolation Panel [NHANES]    Frequency of Communication with Friends and Family: More than three times a week    Frequency of Social Gatherings with Friends and Family: Once a week    Attends Religious Services: Never    Marine scientist or Organizations: No    Attends Archivist Meetings: Never    Marital Status: Widowed  Intimate Partner Violence: Not At Risk (07/01/2021)   Humiliation, Afraid, Rape, and Kick questionnaire    Fear of Current or Ex-Partner: No    Emotionally Abused: No    Physically Abused: No    Sexually  Abused: No   Family Status  Relation Name Status   Father  (Not Specified)   Brother  (Not Specified)   Field seismologist  (Not Specified)   Family History  Problem Relation Age of Onset   CAD Father    CAD Brother    CAD Paternal Aunt    No Known Allergies    Review of Systems  Constitutional: Negative.  Negative for chills and fever.  HENT: Negative.    Eyes: Negative.   Respiratory: Negative.    Cardiovascular: Negative.   Genitourinary: Negative.   Skin: Negative.  Negative for rash.  Psychiatric/Behavioral: Negative.    All other systems reviewed and are negative.     Objective:     BP (!) 150/59   Pulse 71   Temp 98.4 F (36.9 C)   Ht 5' 2"  (1.575 m)   Wt 141 lb (64 kg)   SpO2 95%   BMI 25.79 kg/m  BP Readings from  Last 3 Encounters:  08/05/21 (!) 150/59  05/06/21 (!) 126/53  05/05/21 (!) 133/54   Wt Readings from Last 3 Encounters:  08/05/21 141 lb (64 kg)  07/01/21 140 lb (63.5 kg)  05/05/21 142 lb (64.4 kg)      Physical Exam Vitals and nursing note reviewed.  Constitutional:      Appearance: Normal appearance.  HENT:     Head: Normocephalic.     Right Ear: External ear normal.     Left Ear: External ear normal.     Nose: Nose normal. No congestion.     Mouth/Throat:     Mouth: Mucous membranes are moist.  Eyes:     Conjunctiva/sclera: Conjunctivae normal.  Cardiovascular:     Rate and Rhythm: Normal rate and regular rhythm.     Pulses: Normal pulses.     Heart sounds: Normal heart sounds.  Pulmonary:     Effort: Pulmonary effort is normal.     Breath sounds: Normal breath sounds.  Abdominal:     General: Bowel sounds are normal.  Musculoskeletal:        General: Normal range of motion.  Skin:    General: Skin is warm.     Findings: No erythema or rash.  Neurological:     General: No focal deficit present.     Mental Status: She is alert and oriented to person, place, and time.  Psychiatric:        Mood and Affect: Mood normal.         Behavior: Behavior normal.      Results for orders placed or performed in visit on 08/05/21  Bayer DCA Hb A1c Waived  Result Value Ref Range   HB A1C (BAYER DCA - WAIVED) 5.7 (H) 4.8 - 5.6 %    Last CBC Lab Results  Component Value Date   WBC 11.0 (H) 05/06/2021   HGB 11.0 (L) 05/06/2021   HCT 33.7 (L) 05/06/2021   MCV 91 05/06/2021   MCH 29.6 05/06/2021   RDW 12.3 05/06/2021   PLT 297 61/95/0932   Last metabolic panel Lab Results  Component Value Date   GLUCOSE 132 (H) 05/06/2021   NA 138 05/06/2021   K 4.7 05/06/2021   CL 102 05/06/2021   CO2 21 05/06/2021   BUN 26 05/06/2021   CREATININE 1.11 (H) 05/06/2021   EGFR 52 (L) 05/06/2021   CALCIUM 10.1 05/06/2021   PHOS 2.9 05/21/2020   PROT 7.3 05/06/2021   ALBUMIN 4.4 05/06/2021   LABGLOB 2.9 05/06/2021   AGRATIO 1.5 05/06/2021   BILITOT 0.3 05/06/2021   ALKPHOS 81 05/06/2021   AST 18 05/06/2021   ALT 11 05/06/2021   ANIONGAP 8 05/23/2020   Last lipids Lab Results  Component Value Date   CHOL 100 05/06/2021   HDL 47 05/06/2021   LDLCALC 35 05/06/2021   TRIG 96 05/06/2021   CHOLHDL 2.1 05/06/2021   Last hemoglobin A1c Lab Results  Component Value Date   HGBA1C 5.7 (H) 08/05/2021   Last thyroid functions Lab Results  Component Value Date   TSH 0.220 (L) 05/06/2021   T4TOTAL 8.8 05/06/2021   Last vitamin D No results found for: "25OHVITD2", "25OHVITD3", "VD25OH"    The ASCVD Risk score (Arnett DK, et al., 2019) failed to calculate for the following reasons:   The valid total cholesterol range is 130 to 320 mg/dL    Assessment & Plan:   Problem List Items Addressed This Visit  Cardiovascular and Mediastinum   Essential hypertension - Primary    Hypertension well-controlled on current medication no changes necessary.  Patient reports blood pressure elevated in clinic today which is normal for her every time she comes in for a visit.  Patient reports blood pressure is 120-130/70 at  home.  Completed labs-CBC, CMP, lipid panel, follow-up with worsening unresolved symptoms.        Endocrine   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[place])    Patient's diabetes is well controlled.  A1c 5.7 from 7 patient is compliant and eating a healthy diabetic diet with exercise and calorie counting.  Follow-up in 3 months.      Relevant Orders   Microalbumin / creatinine urine ratio   Bayer DCA Hb A1c Waived (Completed)   CBC with Differential/Platelet   Comprehensive metabolic panel   Hypothyroidism   Relevant Orders   Thyroid Panel With TSH     Other   Hypertriglyceridemia    No new signs and symptoms with health cholesteremia.  Completed lipid panel results pending.  Continue low-cholesterol diet and exercise.      Relevant Orders   Lipid panel   Vitamin D deficiency    Completed vitamin D labs results pending.      Relevant Orders   Vitamin D, 25-hydroxy   Other Visit Diagnoses     Postmenopause       Relevant Orders   DG [facility] DEXA       Return in about 3 months (around 11/05/2021) for chronic disease management.    Ivy Lynn, NP

## 2021-08-06 LAB — CBC WITH DIFFERENTIAL/PLATELET
Basophils Absolute: 0.1 10*3/uL (ref 0.0–0.2)
Basos: 1 %
EOS (ABSOLUTE): 0.5 10*3/uL — ABNORMAL HIGH (ref 0.0–0.4)
Eos: 4 %
Hematocrit: 32.7 % — ABNORMAL LOW (ref 34.0–46.6)
Hemoglobin: 10.7 g/dL — ABNORMAL LOW (ref 11.1–15.9)
Immature Grans (Abs): 0 10*3/uL (ref 0.0–0.1)
Immature Granulocytes: 0 %
Lymphocytes Absolute: 1.9 10*3/uL (ref 0.7–3.1)
Lymphs: 17 %
MCH: 29.6 pg (ref 26.6–33.0)
MCHC: 32.7 g/dL (ref 31.5–35.7)
MCV: 91 fL (ref 79–97)
Monocytes Absolute: 0.6 10*3/uL (ref 0.1–0.9)
Monocytes: 6 %
Neutrophils Absolute: 7.7 10*3/uL — ABNORMAL HIGH (ref 1.4–7.0)
Neutrophils: 72 %
Platelets: 303 10*3/uL (ref 150–450)
RBC: 3.61 x10E6/uL — ABNORMAL LOW (ref 3.77–5.28)
RDW: 13 % (ref 11.7–15.4)
WBC: 10.8 10*3/uL (ref 3.4–10.8)

## 2021-08-06 LAB — LIPID PANEL
Chol/HDL Ratio: 2.3 ratio (ref 0.0–4.4)
Cholesterol, Total: 110 mg/dL (ref 100–199)
HDL: 48 mg/dL (ref >39)
LDL Chol Calc (NIH): 40 mg/dL (ref 0–99)
Triglycerides: 127 mg/dL (ref 0–149)
VLDL Cholesterol Cal: 22 mg/dL (ref 5–40)

## 2021-08-06 LAB — COMPREHENSIVE METABOLIC PANEL
ALT: 10 IU/L (ref 0–32)
AST: 16 IU/L (ref 0–40)
Albumin/Globulin Ratio: 1.5 (ref 1.2–2.2)
Albumin: 4.4 g/dL (ref 3.7–4.7)
Alkaline Phosphatase: 87 IU/L (ref 44–121)
BUN/Creatinine Ratio: 17 (ref 12–28)
BUN: 19 mg/dL (ref 8–27)
Bilirubin Total: 0.4 mg/dL (ref 0.0–1.2)
CO2: 18 mmol/L — ABNORMAL LOW (ref 20–29)
Calcium: 9.8 mg/dL (ref 8.7–10.3)
Chloride: 104 mmol/L (ref 96–106)
Creatinine, Ser: 1.1 mg/dL — ABNORMAL HIGH (ref 0.57–1.00)
Globulin, Total: 2.9 g/dL (ref 1.5–4.5)
Glucose: 123 mg/dL — ABNORMAL HIGH (ref 70–99)
Potassium: 4.7 mmol/L (ref 3.5–5.2)
Sodium: 139 mmol/L (ref 134–144)
Total Protein: 7.3 g/dL (ref 6.0–8.5)
eGFR: 52 mL/min/{1.73_m2} — ABNORMAL LOW (ref >59)

## 2021-08-06 LAB — MICROALBUMIN / CREATININE URINE RATIO
Creatinine, Urine: 17.3 mg/dL
Microalb/Creat Ratio: 134 mg/g creat — ABNORMAL HIGH (ref 0–29)
Microalbumin, Urine: 23.1 ug/mL

## 2021-08-06 LAB — VITAMIN D 25 HYDROXY (VIT D DEFICIENCY, FRACTURES): Vit D, 25-Hydroxy: 35 ng/mL (ref 30.0–100.0)

## 2021-08-06 LAB — THYROID PANEL WITH TSH
Free Thyroxine Index: 2.6 (ref 1.2–4.9)
T3 Uptake Ratio: 31 % (ref 24–39)
T4, Total: 8.5 ug/dL (ref 4.5–12.0)
TSH: 0.374 u[IU]/mL — ABNORMAL LOW (ref 0.450–4.500)

## 2021-08-07 ENCOUNTER — Other Ambulatory Visit: Payer: Self-pay | Admitting: Nurse Practitioner

## 2021-08-07 DIAGNOSIS — E039 Hypothyroidism, unspecified: Secondary | ICD-10-CM

## 2021-08-07 MED ORDER — LEVOTHYROXINE SODIUM 50 MCG PO TABS: 50.0000 ug | ORAL_TABLET | Freq: Every day | ORAL | 3 refills | Status: DC

## 2021-08-11 ENCOUNTER — Other Ambulatory Visit: Payer: Self-pay | Admitting: Nurse Practitioner

## 2021-08-11 MED ORDER — CHOLECALCIFEROL 25 MCG (1000 UT) PO CAPS: 2000.0000 [IU] | ORAL_CAPSULE | Freq: Every day | ORAL | 3 refills | Status: DC

## 2021-09-03 ENCOUNTER — Other Ambulatory Visit: Payer: Self-pay | Admitting: Nurse Practitioner

## 2021-09-03 DIAGNOSIS — D649 Anemia, unspecified: Secondary | ICD-10-CM

## 2021-11-04 ENCOUNTER — Ambulatory Visit (INDEPENDENT_AMBULATORY_CARE_PROVIDER_SITE_OTHER): Payer: Medicare HMO | Admitting: Nurse Practitioner

## 2021-11-04 ENCOUNTER — Encounter: Payer: Self-pay | Admitting: Nurse Practitioner

## 2021-11-04 VITALS — BP 138/76 | HR 60 | Temp 98.7°F | Ht 62.0 in | Wt 144.0 lb

## 2021-11-04 DIAGNOSIS — Z09 Encounter for follow-up examination after completed treatment for conditions other than malignant neoplasm: Secondary | ICD-10-CM

## 2021-11-04 DIAGNOSIS — R32 Unspecified urinary incontinence: Secondary | ICD-10-CM | POA: Diagnosis not present

## 2021-11-04 DIAGNOSIS — E039 Hypothyroidism, unspecified: Secondary | ICD-10-CM

## 2021-11-04 DIAGNOSIS — E1169 Type 2 diabetes mellitus with other specified complication: Secondary | ICD-10-CM

## 2021-11-04 DIAGNOSIS — Z794 Long term (current) use of insulin: Secondary | ICD-10-CM

## 2021-11-04 DIAGNOSIS — I10 Essential (primary) hypertension: Secondary | ICD-10-CM

## 2021-11-04 DIAGNOSIS — E782 Mixed hyperlipidemia: Secondary | ICD-10-CM

## 2021-11-04 DIAGNOSIS — E1110 Type 2 diabetes mellitus with ketoacidosis without coma: Secondary | ICD-10-CM

## 2021-11-04 LAB — BAYER DCA HB A1C WAIVED: HB A1C (BAYER DCA - WAIVED): 6.7 % — ABNORMAL HIGH (ref 4.8–5.6)

## 2021-11-04 MED ORDER — LEVOTHYROXINE SODIUM 50 MCG PO TABS: 50.0000 ug | ORAL_TABLET | Freq: Every day | ORAL | 3 refills | Status: AC

## 2021-11-04 NOTE — Assessment & Plan Note (Signed)
Hypertension well-controlled on current medication no changes necessary.  Continue weight loss, low-sodium diet.  Completed labs-CBC, CMP lipid panel TSH.

## 2021-11-04 NOTE — Patient Instructions (Signed)
Urinary Incontinence Urinary incontinence refers to a condition in which a person is unable to control where and when to pass urine. A person with this condition will urinate involuntarily. This means that the person urinates when he or she does not mean to. What are the causes? This condition may be caused by: Medicines. Infections. Constipation. Overactive bladder muscles. Weak bladder muscles. Weak pelvic floor muscles. These muscles provide support for the bladder, intestine, and, in women, the uterus. Enlarged prostate in men. The prostate is a gland near the bladder. When it gets too big, it can pinch the urethra. With the urethra blocked, the bladder can weaken and lose the ability to empty properly. Surgery. Emotional factors, such as anxiety, stress, or post-traumatic stress disorder (PTSD). Spinal cord injury, nerve injury, or other neurological conditions. Pelvic organ prolapse. This happens in women when organs move out of place and into the vagina. This movement can prevent the bladder and urethra from working properly. What increases the risk? The following factors may make you more likely to develop this condition: Age. The older you are, the higher the risk. Obesity. Being physically inactive. Pregnancy and childbirth. Menopause. Diseases that affect the nerves or spinal cord. Long-term, or chronic, coughing. This can increase pressure on the bladder and pelvic floor muscles. What are the signs or symptoms? Symptoms may vary depending on the type of urinary incontinence you have. They include: A sudden urge to urinate, and passing urine involuntarily before you can get to a bathroom (urge incontinence). Suddenly passing urine when doing activities that force urine to pass, such as coughing, laughing, exercising, or sneezing (stress incontinence). Needing to urinate often but urinating only a small amount, or constantly dribbling urine (overflow incontinence). Urinating  because you cannot get to the bathroom in time due to a physical disability, such as arthritis or injury, or due to a communication or thinking problem, such as Alzheimer's disease (functional incontinence). How is this diagnosed? This condition may be diagnosed based on: Your medical history. A physical exam. Tests, such as: Urine tests. X-rays of your kidney and bladder. Ultrasound. CT scan. Cystoscopy. In this procedure, a health care provider inserts a tube with a light and camera (cystoscope) through the urethra and into the bladder to check for problems. Urodynamic testing. These tests assess how well the bladder, urethra, and sphincter can store and release urine. There are different types of urodynamic tests, and they vary depending on what the test is measuring. To help diagnose your condition, your health care provider may recommend that you keep a log of when you urinate and how much you urinate. How is this treated? Treatment for this condition depends on the type of incontinence that you have and its cause. Treatment may include: Lifestyle changes, such as: Quitting smoking. Maintaining a healthy weight. Staying active. Try to get 150 minutes of moderate-intensity exercise every week. Ask your health care provider which activities are safe for you. Eating a healthy diet. Avoid high-fat foods, like fried foods. Avoid refined carbohydrates like white bread and white rice. Limit how much alcohol and caffeine you drink. Increase your fiber intake. Healthy sources of fiber include beans, whole grains, and fresh fruits and vegetables. Behavioral changes, such as: Pelvic floor muscle exercises. Bladder training, such as lengthening the amount of time between bathroom breaks, or using the bathroom at regular intervals. Using techniques to suppress bladder urges. This can include distraction techniques or controlled breathing exercises. Medicines, such as: Medicines to relax the  bladder   muscles and prevent bladder spasms. Medicines to help slow or prevent the growth of a man's prostate. Botox injections. These can help relax the bladder muscles. Treatments, such as: Using pulses of electricity to help change bladder reflexes (electrical nerve stimulation). For women, using a medical device to prevent urine leaks. This is a small, tampon-like, disposable device that is inserted into the urethra. Injecting collagen or carbon beads (bulking agents) into the urinary sphincter. These can help thicken tissue and close the bladder opening. Surgery. Follow these instructions at home: Lifestyle Limit alcohol and caffeine. These can fill your bladder quickly and irritate it. Keep yourself clean to help prevent odors and skin damage. Ask your health care provider about special skin creams and cleansers that can protect the skin from urine. Consider wearing pads or adult diapers. Make sure to change them regularly, and always change them right after experiencing incontinence. General instructions Take over-the-counter and prescription medicines only as told by your health care provider. Use the bathroom about every 3-4 hours, even if you do not feel the need to urinate. Try to empty your bladder completely every time. After urinating, wait a minute. Then try to urinate again. Make sure you are in a relaxed position while urinating. If your incontinence is caused by nerve problems, keep a log of the medicines you take and the times you go to the bathroom. Keep all follow-up visits. This is important. Where to find more information National Institute of Diabetes and Digestive and Kidney Diseases: www.niddk.nih.gov American Urology Association: www.urologyhealth.org Contact a health care provider if: You have pain that gets worse. Your incontinence gets worse. Get help right away if: You have a fever or chills. You are unable to urinate. You have redness in your groin area or  down your legs. Summary Urinary incontinence refers to a condition in which a person is unable to control where and when to pass urine. This condition may be caused by medicines, infection, weak bladder muscles, weak pelvic floor muscles, enlargement of the prostate (in men), or surgery. Factors such as older age, obesity, pregnancy and childbirth, menopause, neurological diseases, and chronic coughing may increase your risk for developing this condition. Types of urinary incontinence include urge incontinence, stress incontinence, overflow incontinence, and functional incontinence. This condition is usually treated first with lifestyle and behavioral changes, such as quitting smoking, eating a healthier diet, and doing regular pelvic floor exercises. Other treatment options include medicines, bulking agents, medical devices, electrical nerve stimulation, or surgery. This information is not intended to replace advice given to you by your health care provider. Make sure you discuss any questions you have with your health care provider. Document Revised: 08/24/2019 Document Reviewed: 08/24/2019 Elsevier Patient Education  2023 Elsevier Inc.  

## 2021-11-04 NOTE — Progress Notes (Signed)
Established Patient Office Visit  Subjective   Patient ID: Tiffany White, female    DOB: 05/15/1945  Age: 76 y.o. MRN: [id number]  Chief Complaint  Patient presents with   Medical Management of Chronic Issues    3 month    Insomnia    Pt states this has been going on and off for about 2 years now - states she has to get up and go to the bathroom about 4 times     HPI   Pt presents for follow up of hypertension. Patient was diagnosed in 08/27/2019. The patient is tolerating the medication well without side effects. Compliance with treatment has been good; including taking medication as directed , maintains a healthy diet and regular exercise regimen , and following up as directed.   Mixed hyperlipidemia  Pt presents with hyperlipidemia. Patient was diagnosed in 2018.  Compliance with treatment has been good.  The patient is compliant with medications, maintains a low cholesterol diet , follows up as directed , and maintains an exercise regimen . The patient denies experiencing any hypercholesterolemia related symptoms.     Thyroid: Patient presents for evaluation of hypothyroidism. Current symptoms include fatigue, weight gain. Patient denies swelling, anxiousness, feeling excessive energy, tremulousness, palpitations, sweating.      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   Angina pectoris ([place]) 05/14/2020   Coronary artery disease involving native coronary artery of native heart with unstable angina pectoris ([place])    Chest pain 05/13/2020   Diabetes mellitus, type II ([place]) 05/13/2020   Elevated troponin I level 05/13/2020   Hyperglycemia due to diabetes mellitus ([place]) 05/13/2020   Vitamin B12 deficiency 05/13/2020   Chronic kidney disease, stage 3a ([place]) 08/27/2019   Essential hypertension 08/27/2019   DM type 2 with diabetic mixed  hyperlipidemia ([place]) 07/12/2016   Hypothyroidism 04/28/2016   Symptomatic menopausal or female climacteric states 03/21/2014   Hypertriglyceridemia 10/03/2007   Vitamin D deficiency 10/20/2006   Other specified disorders of cartilage, unspecified sites 10/13/2006   Herpes zoster 06/30/2001   Benign neoplasm of colon 09/02/1998   Acute pancreatitis 04/02/1991   Past Medical History:  Diagnosis Date   CAD (coronary artery disease)    Diabetes mellitus without complication ([place])    Hyperlipidemia    Thyroid disease    Past Surgical History:  Procedure Laterality Date   ABDOMINAL HYSTERECTOMY     APPENDECTOMY     CORONARY ARTERY BYPASS GRAFT N/A 05/17/2020   Procedure: CORONARY ARTERY BYPASS GRAFTING (CABG) TIMES FOUR, ON PUMP, USING LEFT INTERNAL MAMMARY ARTERY AND ENDOSCOPICALLY HARVESTED RIGHT GREATER SAPHENOUS VEIN;  Surgeon: Wonda Olds, MD;  Location: [facility];  Service: Open Heart Surgery;  Laterality: N/A;   ENDOVEIN HARVEST OF GREATER SAPHENOUS VEIN Right 05/17/2020   Procedure: ENDOVEIN HARVEST OF GREATER SAPHENOUS VEIN;  Surgeon: Wonda Olds, MD;  Location: [facility];  Service: Open Heart Surgery;  Laterality: Right;   IR THORACENTESIS ASP PLEURAL SPACE W/IMG GUIDE  05/22/2020   LEFT HEART CATH AND CORONARY ANGIOGRAPHY N/A 05/14/2020   Procedure: LEFT HEART CATH AND CORONARY ANGIOGRAPHY;  Surgeon: Troy Sine, MD;  Location: [facility];  Service: Cardiovascular;  Laterality: N/A;   TEE WITHOUT CARDIOVERSION N/A 05/17/2020   Procedure: TRANSESOPHAGEAL ECHOCARDIOGRAM (TEE);  Surgeon: Wonda Olds, MD;  Location: [facility];  Service: Open  Heart Surgery;  Laterality: N/A;   TONSILLECTOMY     TONSILLECTOMY     Social History   Tobacco Use   Smoking status: Former    Passive exposure: Never   Smokeless tobacco: Never  Vaping Use   Vaping Use: Never used  Substance Use Topics   Alcohol use: Not Currently   Drug use: Never   Social History    Socioeconomic History   Marital status: Widowed    Spouse name: Not on file   Number of children: 1   Years of education: Not on file   Highest education level: Not on file  Occupational History   Not on file  Tobacco Use   Smoking status: Former    Passive exposure: Never   Smokeless tobacco: Never  Vaping Use   Vaping Use: Never used  Substance and Sexual Activity   Alcohol use: Not Currently   Drug use: Never   Sexual activity: Not on file  Other Topics Concern   Not on file  Social History Narrative   Widowed since 2019.   1 daughter, sees weekly.    Social Determinants of Health   Financial Resource Strain: Low Risk  (07/01/2021)   Overall Financial Resource Strain (CARDIA)    Difficulty of Paying Living Expenses: Not hard at all  Food Insecurity: No Food Insecurity (07/01/2021)   Hunger Vital Sign    Worried About Running Out of Food in the Last Year: Never true    Ran Out of Food in the Last Year: Never true  Transportation Needs: No Transportation Needs (07/01/2021)   PRAPARE - Hydrologist (Medical): No    Lack of Transportation (Non-Medical): No  Physical Activity: Sufficiently Active (07/01/2021)   Exercise Vital Sign    Days of Exercise per Week: 7 days    Minutes of Exercise per Session: 30 min  Stress: No Stress Concern Present (07/01/2021)   [place]    Feeling of Stress : Not at all  Social Connections: Socially Isolated (07/01/2021)   Social Connection and Isolation Panel [NHANES]    Frequency of Communication with Friends and Family: More than three times a week    Frequency of Social Gatherings with Friends and Family: Once a week    Attends Religious Services: Never    Marine scientist or Organizations: No    Attends Archivist Meetings: Never    Marital Status: Widowed  Intimate Partner Violence: Not At Risk (07/01/2021)   Humiliation,  Afraid, Rape, and Kick questionnaire    Fear of Current or Ex-Partner: No    Emotionally Abused: No    Physically Abused: No    Sexually Abused: No   Family Status  Relation Name Status   Father  (Not Specified)   Brother  (Not Specified)   Field seismologist  (Not Specified)   Family History  Problem Relation Age of Onset   CAD Father    CAD Brother    CAD Paternal Aunt    No Known Allergies    Review of Systems  Constitutional: Negative.  Negative for chills and fever.  HENT: Negative.    Eyes: Negative.   Respiratory: Negative.    Cardiovascular: Negative.   Gastrointestinal: Negative.   Genitourinary: Negative.   Musculoskeletal: Negative.   Skin: Negative.  Negative for itching and rash.  Neurological: Negative.   All other systems reviewed and are negative.  Objective:     BP 138/76   Pulse 60   Temp 98.7 F (37.1 C)   Ht _0  (1.575 m)   Wt 144 lb (65.3 kg)   SpO2 97%   BMI 26.34 kg/m  BP Readings from Last 3 Encounters:  11/04/21 138/76  08/05/21 (!) 150/59  05/06/21 (!) 126/53   Wt Readings from Last 3 Encounters:  11/04/21 144 lb (65.3 kg)  08/05/21 141 lb (64 kg)  07/01/21 140 lb (63.5 kg)      Physical Exam Vitals reviewed.  Constitutional:      Appearance: Normal appearance.  HENT:     Head: Normocephalic.     Nose: Nose normal. No congestion.     Mouth/Throat:     Mouth: Mucous membranes are moist.     Pharynx: Oropharynx is clear.  Eyes:     Conjunctiva/sclera: Conjunctivae normal.  Cardiovascular:     Rate and Rhythm: Normal rate and regular rhythm.     Pulses: Normal pulses.     Heart sounds: Normal heart sounds.  Pulmonary:     Effort: Pulmonary effort is normal.     Breath sounds: Normal breath sounds.  Abdominal:     General: Bowel sounds are normal.  Musculoskeletal:        General: Normal range of motion.  Skin:    General: Skin is warm.     Findings: No erythema or rash.  Neurological:     General: No focal  deficit present.     Mental Status: She is alert and oriented to person, place, and time.  Psychiatric:        Mood and Affect: Mood normal.        Behavior: Behavior normal.      No results found for any visits on 11/04/21.  Last CBC Lab Results  Component Value Date   WBC 10.8 08/05/2021   HGB 10.7 (L) 08/05/2021   HCT 32.7 (L) 08/05/2021   MCV 91 08/05/2021   MCH 29.6 08/05/2021   RDW 13.0 08/05/2021   PLT 303 48/18/5631   Last metabolic panel Lab Results  Component Value Date   GLUCOSE 123 (H) 08/05/2021   NA 139 08/05/2021   K 4.7 08/05/2021   CL 104 08/05/2021   CO2 18 (L) 08/05/2021   BUN 19 08/05/2021   CREATININE 1.10 (H) 08/05/2021   EGFR 52 (L) 08/05/2021   CALCIUM 9.8 08/05/2021   PHOS 2.9 05/21/2020   PROT 7.3 08/05/2021   ALBUMIN 4.4 08/05/2021   LABGLOB 2.9 08/05/2021   AGRATIO 1.5 08/05/2021   BILITOT 0.4 08/05/2021   ALKPHOS 87 08/05/2021   AST 16 08/05/2021   ALT 10 08/05/2021   ANIONGAP 8 05/23/2020   Last lipids Lab Results  Component Value Date   CHOL 110 08/05/2021   HDL 48 08/05/2021   LDLCALC 40 08/05/2021   TRIG 127 08/05/2021   CHOLHDL 2.3 08/05/2021   Last hemoglobin A1c Lab Results  Component Value Date   HGBA1C 5.7 (H) 08/05/2021      The ASCVD Risk score (Arnett DK, et al., 2019) failed to calculate for the following reasons:   The valid total cholesterol range is 130 to 320 mg/dL    Assessment & Plan:   Problem List Items Addressed This Visit       Cardiovascular and Mediastinum   Essential hypertension    Hypertension well-controlled on current medication no changes necessary.  Continue weight loss, low-sodium diet.  Completed labs-CBC, CMP lipid  panel TSH.        Endocrine   Diabetes mellitus, type II ([place])    Labs completed CBC, CMP, lipid panel results pending.      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[place]) - Primary    Diabetes well controlled.  Continue current medication no changes  necessary.  Labs completed results pending.  Continue diabetic diet, weight loss and exercise.      Relevant Orders   Bayer DCA Hb A1c Waived   Hypothyroidism    Completed TSH results pending.  No new or worsening symptoms.      Relevant Medications   levothyroxine (SYNTHROID) 50 MCG tablet     Other   Urinary incontinence    For urinary incontinence this is new for patient symptoms present in the past few weeks.  Patient states drinking a lot of water due to dry mouth and waking up 3-4 times every night to use the bathroom. i discussed with patient different ways of reducing the amount of trips to the bathroom every night by drinking less water before bedtime at least 3 to 4 hours before going to sleep.  Will put patient on an anticholinergic medication appropriate for age.  Follow-up with unresolved symptoms.      Other Visit Diagnoses     Encounter for follow-up       Relevant Orders   Thyroid Panel With TSH   Lipid panel   CBC with Differential/Platelet   CMP14+EGFR       Return in about 6 months (around 05/06/2022) for chronic disease management.    Ivy Lynn, NP

## 2021-11-04 NOTE — Assessment & Plan Note (Signed)
Completed TSH results pending.  No new or worsening symptoms.

## 2021-11-04 NOTE — Assessment & Plan Note (Signed)
For urinary incontinence this is new for patient symptoms present in the past few weeks.  Patient states drinking a lot of water due to dry mouth and waking up 3-4 times every night to use the bathroom. i discussed with patient different ways of reducing the amount of trips to the bathroom every night by drinking less water before bedtime at least 3 to 4 hours before going to sleep.  Will put patient on an anticholinergic medication appropriate for age.  Follow-up with unresolved symptoms.

## 2021-11-04 NOTE — Assessment & Plan Note (Signed)
Labs completed CBC, CMP, lipid panel results pending.

## 2021-11-04 NOTE — Assessment & Plan Note (Signed)
Diabetes well controlled.  Continue current medication no changes necessary.  Labs completed results pending.  Continue diabetic diet, weight loss and exercise.

## 2021-11-05 LAB — LIPID PANEL
Chol/HDL Ratio: 2.4 ratio (ref 0.0–4.4)
Cholesterol, Total: 117 mg/dL (ref 100–199)
HDL: 49 mg/dL (ref >39)
LDL Chol Calc (NIH): 46 mg/dL (ref 0–99)
Triglycerides: 121 mg/dL (ref 0–149)
VLDL Cholesterol Cal: 22 mg/dL (ref 5–40)

## 2021-11-05 LAB — CMP14+EGFR
ALT: 9 IU/L (ref 0–32)
AST: 17 IU/L (ref 0–40)
Albumin/Globulin Ratio: 1.6 (ref 1.2–2.2)
Albumin: 4.5 g/dL (ref 3.8–4.8)
Alkaline Phosphatase: 95 IU/L (ref 44–121)
BUN/Creatinine Ratio: 19 (ref 12–28)
BUN: 24 mg/dL (ref 8–27)
Bilirubin Total: 0.3 mg/dL (ref 0.0–1.2)
CO2: 21 mmol/L (ref 20–29)
Calcium: 10.2 mg/dL (ref 8.7–10.3)
Chloride: 103 mmol/L (ref 96–106)
Creatinine, Ser: 1.25 mg/dL — ABNORMAL HIGH (ref 0.57–1.00)
Globulin, Total: 2.8 g/dL (ref 1.5–4.5)
Glucose: 135 mg/dL — ABNORMAL HIGH (ref 70–99)
Potassium: 4.9 mmol/L (ref 3.5–5.2)
Sodium: 142 mmol/L (ref 134–144)
Total Protein: 7.3 g/dL (ref 6.0–8.5)
eGFR: 45 mL/min/{1.73_m2} — ABNORMAL LOW (ref >59)

## 2021-11-05 LAB — CBC WITH DIFFERENTIAL/PLATELET
Basophils Absolute: 0.1 10*3/uL (ref 0.0–0.2)
Basos: 1 %
EOS (ABSOLUTE): 0.4 10*3/uL (ref 0.0–0.4)
Eos: 5 %
Hematocrit: 33.6 % — ABNORMAL LOW (ref 34.0–46.6)
Hemoglobin: 11 g/dL — ABNORMAL LOW (ref 11.1–15.9)
Immature Grans (Abs): 0 10*3/uL (ref 0.0–0.1)
Immature Granulocytes: 0 %
Lymphocytes Absolute: 1.8 10*3/uL (ref 0.7–3.1)
Lymphs: 21 %
MCH: 30 pg (ref 26.6–33.0)
MCHC: 32.7 g/dL (ref 31.5–35.7)
MCV: 92 fL (ref 79–97)
Monocytes Absolute: 0.6 10*3/uL (ref 0.1–0.9)
Monocytes: 7 %
Neutrophils Absolute: 5.6 10*3/uL (ref 1.4–7.0)
Neutrophils: 66 %
Platelets: 265 10*3/uL (ref 150–450)
RBC: 3.67 x10E6/uL — ABNORMAL LOW (ref 3.77–5.28)
RDW: 12.4 % (ref 11.7–15.4)
WBC: 8.5 10*3/uL (ref 3.4–10.8)

## 2021-11-05 LAB — THYROID PANEL WITH TSH
Free Thyroxine Index: 2.4 (ref 1.2–4.9)
T3 Uptake Ratio: 31 % (ref 24–39)
T4, Total: 7.8 ug/dL (ref 4.5–12.0)
TSH: 1.91 u[IU]/mL (ref 0.450–4.500)

## 2021-11-06 ENCOUNTER — Ambulatory Visit: Payer: Medicare HMO | Admitting: Nurse Practitioner

## 2021-12-08 ENCOUNTER — Other Ambulatory Visit: Payer: Self-pay | Admitting: Nurse Practitioner

## 2022-01-01 ENCOUNTER — Other Ambulatory Visit: Payer: Self-pay | Admitting: Nurse Practitioner

## 2022-01-01 DIAGNOSIS — D649 Anemia, unspecified: Secondary | ICD-10-CM

## 2022-02-12 ENCOUNTER — Encounter: Payer: Self-pay | Admitting: *Deleted

## 2022-03-16 ENCOUNTER — Other Ambulatory Visit: Payer: Self-pay | Admitting: Cardiology

## 2022-03-26 ENCOUNTER — Other Ambulatory Visit: Payer: Self-pay | Admitting: Cardiology

## 2022-03-26 ENCOUNTER — Other Ambulatory Visit: Payer: Self-pay | Admitting: Family Medicine

## 2022-04-01 ENCOUNTER — Other Ambulatory Visit: Payer: Self-pay | Admitting: Family Medicine

## 2022-05-12 ENCOUNTER — Ambulatory Visit (INDEPENDENT_AMBULATORY_CARE_PROVIDER_SITE_OTHER): Payer: Medicare HMO | Admitting: Family Medicine

## 2022-05-12 ENCOUNTER — Ambulatory Visit: Payer: Medicare HMO | Admitting: Nurse Practitioner

## 2022-05-12 ENCOUNTER — Encounter: Payer: Self-pay | Admitting: Family Medicine

## 2022-05-12 VITALS — BP 136/52 | HR 73 | Temp 97.2°F | Ht 62.0 in | Wt 151.2 lb

## 2022-05-12 DIAGNOSIS — E559 Vitamin D deficiency, unspecified: Secondary | ICD-10-CM

## 2022-05-12 DIAGNOSIS — Z794 Long term (current) use of insulin: Secondary | ICD-10-CM

## 2022-05-12 DIAGNOSIS — N183 Chronic kidney disease, stage 3 unspecified: Secondary | ICD-10-CM

## 2022-05-12 DIAGNOSIS — E785 Hyperlipidemia, unspecified: Secondary | ICD-10-CM

## 2022-05-12 DIAGNOSIS — E1122 Type 2 diabetes mellitus with diabetic chronic kidney disease: Secondary | ICD-10-CM

## 2022-05-12 DIAGNOSIS — E039 Hypothyroidism, unspecified: Secondary | ICD-10-CM

## 2022-05-12 DIAGNOSIS — I152 Hypertension secondary to endocrine disorders: Secondary | ICD-10-CM | POA: Diagnosis not present

## 2022-05-12 DIAGNOSIS — I10 Essential (primary) hypertension: Secondary | ICD-10-CM | POA: Diagnosis not present

## 2022-05-12 DIAGNOSIS — E1159 Type 2 diabetes mellitus with other circulatory complications: Secondary | ICD-10-CM

## 2022-05-12 DIAGNOSIS — E782 Mixed hyperlipidemia: Secondary | ICD-10-CM | POA: Diagnosis not present

## 2022-05-12 DIAGNOSIS — E1169 Type 2 diabetes mellitus with other specified complication: Secondary | ICD-10-CM | POA: Diagnosis not present

## 2022-05-12 LAB — BAYER DCA HB A1C WAIVED: HB A1C (BAYER DCA - WAIVED): 7.4 % — ABNORMAL HIGH (ref 4.8–5.6)

## 2022-05-12 MED ORDER — DAPAGLIFLOZIN PROPANEDIOL 10 MG PO TABS
10.0000 mg | ORAL_TABLET | Freq: Every day | ORAL | 3 refills | Status: DC
Start: 2022-05-12 — End: 2022-09-17

## 2022-05-12 NOTE — Progress Notes (Signed)
Subjective:  Patient ID: Tiffany SeedsBrenda Battie, female    DOB: 06/04/1945, 77 y.o.   MRN: [id number]  Patient Care Team: Sonny Mastersakes, Daysy Santini M, FNP as PCP - General (Family Medicine) Rollene RotundaHochrein, James, MD as PCP - Cardiology (Cardiology)   Chief Complaint:  Establish Care (Previous JE patient ) and Medical Management of Chronic Issues   HPI: Tiffany White is a 77 y.o. female presenting on 05/12/2022 for Establish Care (Previous JE patient ) and Medical Management of Chronic Issues  Pt presents today to establish care with new PCP and for management of chronic medical conditions.   1. Type 2 diabetes mellitus with other specified complication, with long-term current use of insulin She has stopped her metformin as she was not able to tolerate it. States she has been taking her insulin as prescribed. She is not on Ozempic or Marcelline DeistFarxiga and has known CKD and CAD. Not on ACEi/ARB. Denies polyuria, polyphagia, or polydipsia.   2. Hyperlipidemia associated with type 2 diabetes mellitus On statin therapy and tolerating well. Does try to follow a good diet, is very active daily.   3. CKD stage 3 due to type 2 diabetes mellitus Not on ACEi/ARB, or Farxiga. Denies changes in urinary output.  4. Hypertension associated with type 2 diabetes mellitus On Norvasc and Lopressor. States BP is well controlled at home. No chest pain, leg swelling, palpitations, headaches, weakness, or confusion.   5. Vitamin D deficiency On repletion therapy. Denies arthralgias, trouble walking, or recent fractures.   6. Acquired hypothyroidism On levothyroxine 50 mcg daily and tolerating well. States she has had an increase in her fatigue. No chest pain or shortness of breath, no anginal symptoms. Does feel she is getting adequate rest but still feels malaise and fatigue. No changes in bowel habits. No hair, skin, or nail changes.      Relevant past medical, surgical, family, and social history reviewed and updated as indicated.   Allergies and medications reviewed and updated. Data reviewed: Chart in Epic.   Past Medical History:  Diagnosis Date   CAD (coronary artery disease)    Diabetes mellitus without complication    Hyperlipidemia    Thyroid disease     Past Surgical History:  Procedure Laterality Date   ABDOMINAL HYSTERECTOMY     APPENDECTOMY     CORONARY ARTERY BYPASS GRAFT N/A 05/17/2020   Procedure: CORONARY ARTERY BYPASS GRAFTING (CABG) TIMES FOUR, ON PUMP, USING LEFT INTERNAL MAMMARY ARTERY AND ENDOSCOPICALLY HARVESTED RIGHT GREATER SAPHENOUS VEIN;  Surgeon: Linden DolinAtkins, Broadus Z, MD;  Location: [facility] OR;  Service: Open Heart Surgery;  Laterality: N/A;   ENDOVEIN HARVEST OF GREATER SAPHENOUS VEIN Right 05/17/2020   Procedure: ENDOVEIN HARVEST OF GREATER SAPHENOUS VEIN;  Surgeon: Linden DolinAtkins, Broadus Z, MD;  Location: [facility] OR;  Service: Open Heart Surgery;  Laterality: Right;   IR THORACENTESIS ASP PLEURAL SPACE W/IMG GUIDE  05/22/2020   LEFT HEART CATH AND CORONARY ANGIOGRAPHY N/A 05/14/2020   Procedure: LEFT HEART CATH AND CORONARY ANGIOGRAPHY;  Surgeon: Lennette BihariKelly, Thomas A, MD;  Location: [facility] INVASIVE [facility];  Service: Cardiovascular;  Laterality: N/A;   TEE WITHOUT CARDIOVERSION N/A 05/17/2020   Procedure: TRANSESOPHAGEAL ECHOCARDIOGRAM (TEE);  Surgeon: Linden DolinAtkins, Broadus Z, MD;  [facility];  Service: Open Heart Surgery;  Laterality: N/A;   TONSILLECTOMY     TONSILLECTOMY      Social History   Socioeconomic History   Marital status: Widowed    Spouse name: Not on file   Number of children:  2   Years of education: Not on file   Highest education level: Not on file  Occupational History   Not on file  Tobacco Use   Smoking status: Former    Types: Cigarettes    Quit date: 87    Years since quitting: 30.2    Passive exposure: Never   Smokeless tobacco: Never  Vaping Use   Vaping Use: Never used  Substance and Sexual Activity   Alcohol use: Not Currently   Drug use: Never   Sexual activity: Not  Currently  Other Topics Concern   Not on file  Social History Narrative   Widowed since 2019.   1 daughter, sees weekly.    Social Determinants of Health   Financial Resource Strain: Low Risk  (07/01/2021)   Overall Financial Resource Strain (CARDIA)    Difficulty of Paying Living Expenses: Not hard at all  Food Insecurity: No Food Insecurity (07/01/2021)   Hunger Vital Sign    Worried About Running Out of Food in the Last Year: Never true    Ran Out of Food in the Last Year: Never true  Transportation Needs: No Transportation Needs (07/01/2021)   PRAPARE - Administrator, Civil Service (Medical): No    Lack of Transportation (Non-Medical): No  Physical Activity: Sufficiently Active (07/01/2021)   Exercise Vital Sign    Days of Exercise per Week: 7 days    Minutes of Exercise per Session: 30 min  Stress: No Stress Concern Present (07/01/2021)   [employer] of Occupational Health - Occupational Stress Questionnaire    Feeling of Stress : Not at all  Social Connections: Socially Isolated (07/01/2021)   Social Connection and Isolation Panel [NHANES]    Frequency of Communication with Friends and Family: More than three times a week    Frequency of Social Gatherings with Friends and Family: Once a week    Attends Religious Services: Never    Database administrator or Organizations: No    Attends Banker Meetings: Never    Marital Status: Widowed  Intimate Partner Violence: Not At Risk (07/01/2021)   Humiliation, Afraid, Rape, and Kick questionnaire    Fear of Current or Ex-Partner: No    Emotionally Abused: No    Physically Abused: No    Sexually Abused: No    Outpatient Encounter Medications as of 05/12/2022  Medication Sig   Accu-Chek Softclix Lancets lancets Check BS BID Dx E11.9   amLODipine (NORVASC) 5 MG tablet Take 1 tablet (5 mg total) by mouth daily. PATIENT MUST SCHEDULE ANNUAL APPOINTMENT FOR FUTURE REFILLS   aspirin EC 81 MG tablet Take 1  tablet (81 mg total) by mouth daily. Swallow whole.   Cholecalciferol 25 MCG (1000 UT) capsule TAKE TWO CAPSULES DAILY   cyanocobalamin 1000 MCG tablet Take 1,000 mcg by mouth daily.   ferrous sulfate (FEROSUL) 325 (65 FE) MG tablet TAKE ONE TABLET BY MOUTH ONCE DAILY WITH BREAKFAST   glucose blood (ACCU-CHEK GUIDE) test strip Check BS BID Dx E11.9   insulin aspart (NOVOLOG) 100 UNIT/ML injection Inject 14 Units into the skin 2 (two) times daily.   insulin aspart protamine - aspart (NOVOLOG MIX 70/30 FLEXPEN) (70-30) 100 UNIT/ML FlexPen INJECT 18 UNITS AS DIRECTED TWICE DAILY   Insulin Pen Needle (GNP ULTICARE PEN NEEDLES) 32G X 4 MM MISC Check BS BID Dx E11.9   levothyroxine (SYNTHROID) 50 MCG tablet Take 1 tablet (50 mcg total) by mouth daily.   metoprolol tartrate (  LOPRESSOR) 50 MG tablet TAKE ONE TABLET TWICE DAILY   Omega-3 Fatty Acids (FISH OIL) 1000 MG CAPS Take 1 capsule by mouth daily.   polyethylene glycol (MIRALAX / GLYCOLAX) 17 g packet Take 17 g by mouth daily.   rosuvastatin (CRESTOR) 20 MG tablet TAKE ONE TABLET ONCE DAILY   senna (SENOKOT) 8.6 MG tablet Take 1 tablet by mouth daily.   [DISCONTINUED] metFORMIN (GLUCOPHAGE) 1000 MG tablet TAKE ONE TABLET TWICE DAILY WITH MEAL(S) (Patient not taking: Reported on 05/12/2022)   No facility-administered encounter medications on file as of 05/12/2022.    No Known Allergies  Review of Systems  Constitutional:  Positive for fatigue. Negative for activity change, appetite change, chills, diaphoresis, fever and unexpected weight change.  HENT: Negative.    Eyes: Negative.   Respiratory:  Negative for cough, chest tightness and shortness of breath.   Cardiovascular:  Negative for chest pain, palpitations and leg swelling.  Gastrointestinal:  Negative for abdominal pain, blood in stool, constipation, diarrhea, nausea and vomiting.  Endocrine: Negative.   Genitourinary:  Negative for decreased urine volume, difficulty urinating, dysuria,  frequency and urgency.  Musculoskeletal:  Negative for arthralgias and myalgias.  Skin: Negative.   Allergic/Immunologic: Negative.   Neurological:  Negative for dizziness, tremors, seizures, syncope, facial asymmetry, speech difficulty, weakness, light-headedness, numbness and headaches.  Hematological: Negative.   Psychiatric/Behavioral:  Negative for confusion, hallucinations, sleep disturbance and suicidal ideas.   All other systems reviewed and are negative.       Objective:  BP (!) 136/52   Pulse 73   Temp (!) 97.2 F (36.2 C) (Temporal)   Ht 5\' 2"  (1.575 m)   Wt 151 lb 3.2 oz (68.6 kg)   SpO2 94%   BMI 27.65 kg/m    Wt Readings from Last 3 Encounters:  05/12/22 151 lb 3.2 oz (68.6 kg)  11/04/21 144 lb (65.3 kg)  08/05/21 141 lb (64 kg)    Physical Exam Vitals and nursing note reviewed.  Constitutional:      General: She is not in acute distress.    Appearance: Normal appearance. She is well-developed and well-groomed. She is obese. She is not ill-appearing, toxic-appearing or diaphoretic.  HENT:     Head: Normocephalic and atraumatic.     Jaw: There is normal jaw occlusion.     Right Ear: Hearing normal.     Left Ear: Hearing normal.     Nose: Nose normal.     Mouth/Throat:     Lips: Pink.     Mouth: Mucous membranes are moist.     Pharynx: Uvula midline.  Eyes:     General: Lids are normal.     Conjunctiva/sclera: Conjunctivae normal.     Pupils: Pupils are equal, round, and reactive to light.  Neck:     Thyroid: No thyroid mass, thyromegaly or thyroid tenderness.     Vascular: No carotid bruit or JVD.     Trachea: Trachea and phonation normal.  Cardiovascular:     Rate and Rhythm: Normal rate and regular rhythm.     Chest Wall: PMI is not displaced.     Pulses: Normal pulses.          Dorsalis pedis pulses are 2+ on the right side and 2+ on the left side.       Posterior tibial pulses are 2+ on the right side and 2+ on the left side.     Heart  sounds: Normal heart sounds. No murmur heard.  No friction rub. No gallop.     Comments: Well healed sternotomy Pulmonary:     Effort: Pulmonary effort is normal. No respiratory distress.     Breath sounds: Normal breath sounds. No wheezing.  Abdominal:     General: There is no abdominal bruit.     Palpations: There is no hepatomegaly or splenomegaly.  Musculoskeletal:        General: Normal range of motion.     Cervical back: Normal range of motion and neck supple.     Right lower leg: No edema.     Left lower leg: No edema.  Feet:     Right foot:     Protective Sensation: 10 sites tested.  10 sites sensed.     Skin integrity: Skin integrity normal.     Left foot:     Protective Sensation: 10 sites tested.  10 sites sensed.     Skin integrity: Skin integrity normal.  Lymphadenopathy:     Cervical: No cervical adenopathy.  Skin:    General: Skin is warm and dry.     Capillary Refill: Capillary refill takes less than 2 seconds.     Coloration: Skin is not cyanotic, jaundiced or pale.     Findings: No rash.  Neurological:     General: No focal deficit present.     Mental Status: She is alert and oriented to person, place, and time.     Sensory: Sensation is intact.     Motor: Motor function is intact.     Coordination: Coordination is intact.     Gait: Gait is intact.     Deep Tendon Reflexes: Reflexes are normal and symmetric.  Psychiatric:        Attention and Perception: Attention and perception normal.        Mood and Affect: Mood and affect normal.        Speech: Speech normal.        Behavior: Behavior normal. Behavior is cooperative.        Thought Content: Thought content normal.        Cognition and Memory: Cognition and memory normal.        Judgment: Judgment normal.     Results for orders placed or performed in visit on 11/04/21  Thyroid Panel With TSH  Result Value Ref Range   TSH 1.910 0.450 - 4.500 uIU/mL   T4, Total 7.8 4.5 - 12.0 ug/dL   T3 Uptake  Ratio 31 24 - 39 %   Free Thyroxine Index 2.4 1.2 - 4.9  Lipid panel  Result Value Ref Range   Cholesterol, Total 117 100 - 199 mg/dL   Triglycerides 540 0 - 149 mg/dL   HDL 49 >98 mg/dL   VLDL Cholesterol Cal 22 5 - 40 mg/dL   LDL Chol Calc (NIH) 46 0 - 99 mg/dL   Chol/HDL Ratio 2.4 0.0 - 4.4 ratio  CBC with Differential/Platelet  Result Value Ref Range   WBC 8.5 3.4 - 10.8 x10E3/uL   RBC 3.67 (L) 3.77 - 5.28 x10E6/uL   Hemoglobin 11.0 (L) 11.1 - 15.9 g/dL   Hematocrit 11.9 (L) 14.7 - 46.6 %   MCV 92 79 - 97 fL   MCH 30.0 26.6 - 33.0 pg   MCHC 32.7 31.5 - 35.7 g/dL   RDW 82.9 56.2 - 13.0 %   Platelets 265 150 - 450 x10E3/uL   Neutrophils 66 Not Estab. %   Lymphs 21 Not Estab. %   Monocytes  7 Not Estab. %   Eos 5 Not Estab. %   Basos 1 Not Estab. %   Neutrophils Absolute 5.6 1.4 - 7.0 x10E3/uL   Lymphocytes Absolute 1.8 0.7 - 3.1 x10E3/uL   Monocytes Absolute 0.6 0.1 - 0.9 x10E3/uL   EOS (ABSOLUTE) 0.4 0.0 - 0.4 x10E3/uL   Basophils Absolute 0.1 0.0 - 0.2 x10E3/uL   Immature Granulocytes 0 Not Estab. %   Immature Grans (Abs) 0.0 0.0 - 0.1 x10E3/uL  CMP14+EGFR  Result Value Ref Range   Glucose 135 (H) 70 - 99 mg/dL   BUN 24 8 - 27 mg/dL   Creatinine, Ser 6.38 (H) 0.57 - 1.00 mg/dL   eGFR 45 (L) >93 TD/SKA/7.68   BUN/Creatinine Ratio 19 12 - 28   Sodium 142 134 - 144 mmol/L   Potassium 4.9 3.5 - 5.2 mmol/L   Chloride 103 96 - 106 mmol/L   CO2 21 20 - 29 mmol/L   Calcium 10.2 8.7 - 10.3 mg/dL   Total Protein 7.3 6.0 - 8.5 g/dL   Albumin 4.5 3.8 - 4.8 g/dL   Globulin, Total 2.8 1.5 - 4.5 g/dL   Albumin/Globulin Ratio 1.6 1.2 - 2.2   Bilirubin Total 0.3 0.0 - 1.2 mg/dL   Alkaline Phosphatase 95 44 - 121 IU/L   AST 17 0 - 40 IU/L   ALT 9 0 - 32 IU/L  Bayer DCA Hb A1c Waived  Result Value Ref Range   HB A1C (BAYER DCA - WAIVED) 6.7 (H) 4.8 - 5.6 %       Pertinent labs & imaging results that were available during my care of the patient were reviewed by me and  considered in my medical decision making.  Assessment & Plan:  Cree was seen today for establish care and medical management of chronic issues.  Diagnoses and all orders for this visit:  Type 2 diabetes mellitus with other specified complication, with long-term current use of insulin A1C 7.4 today, has been off of metformin. Will repeat in 3 months. If still controlled, will add Ozempic and titrate up dosing and titrate down insulin dosing if able. Diet and exercise encouraged. Plan discussed with pt and she agrees.  -     CBC with Differential/Platelet -     CMP14+EGFR -     Bayer DCA Hb A1c Waived -     Vitamin B12 -     Lipid panel -     Thyroid Panel With TSH  Hyperlipidemia associated with type 2 diabetes mellitus Diet encouraged - increase intake of fresh fruits and vegetables, increase intake of lean proteins. Bake, broil, or grill foods. Avoid fried, greasy, and fatty foods. Avoid fast foods. Increase intake of fiber-rich whole grains. Exercise encouraged - at least 150 minutes per week and advance as tolerated.  Goal BMI < 25. Continue medications as prescribed. Follow up in 3-6 months as discussed.  -     CMP14+EGFR -     Lipid panel  CKD stage 3 due to type 2 diabetes mellitus Labs pending. Will start Farxiga.  -     CBC with Differential/Platelet -     CMP14+EGFR -     Bayer DCA Hb A1c Waived  Hypertension associated with type 2 diabetes mellitus BP well controlled. Changes were not made in regimen today. Goal BP is 130/80. Pt aware to report any persistent high or low readings. DASH diet and exercise encouraged. Exercise at least 150 minutes per week and increase as tolerated. Goal  BMI > 25. Stress management encouraged. Avoid nicotine and tobacco product use. Avoid excessive alcohol and NSAID's. Avoid more than 2000 mg of sodium daily. Medications as prescribed. Follow up as scheduled.  -     CBC with Differential/Platelet -     CMP14+EGFR -     Thyroid Panel With  TSH  Vitamin D deficiency Labs pending. Continue repletion therapy. If indicated, will change repletion dosage. Eat foods rich in Vit D including milk, orange juice, yogurt with vitamin D added, salmon or mackerel, canned tuna fish, cereals with vitamin D added, and cod liver oil. Get out in the sun but make sure to wear at least SPF 30 sunscreen.  -     Vitamin B12 -     VITAMIN D 25 Hydroxy (Vit-D Deficiency, Fractures)  Acquired hypothyroidism Thyroid disease has been well controlled. Labs are pending. Adjustments to regimen will be made if warranted. Make sure to take medications on an empty stomach with a full glass of water. Make sure to avoid vitamins or supplements for at least 4 hours before and 4 hours after taking medications. Repeat labs in 3 months if adjustments are made and in 6 months if stable.   -     Thyroid Panel With TSH     Continue all other maintenance medications.  Follow up plan: Return in about 3 months (around 08/11/2022), or if symptoms worsen or fail to improve, for DM.   Continue healthy lifestyle choices, including diet (rich in fruits, vegetables, and lean proteins, and low in salt and simple carbohydrates) and exercise (at least 30 minutes of moderate physical activity daily).  Educational handout given for DM  The above assessment and management plan was discussed with the patient. The patient verbalized understanding of and has agreed to the management plan. Patient is aware to call the clinic if they develop any new symptoms or if symptoms persist or worsen. Patient is aware when to return to the clinic for a follow-up visit. Patient educated on when it is appropriate to go to the emergency department.   Kari Baars, FNP-C [facility] [phone]

## 2022-05-12 NOTE — Patient Instructions (Addendum)

## 2022-05-13 LAB — CBC WITH DIFFERENTIAL/PLATELET
Basophils Absolute: 0.1 10*3/uL (ref 0.0–0.2)
Basos: 1 %
EOS (ABSOLUTE): 0.4 10*3/uL (ref 0.0–0.4)
Eos: 4 %
Hematocrit: 35 % (ref 34.0–46.6)
Hemoglobin: 11.4 g/dL (ref 11.1–15.9)
Immature Grans (Abs): 0 10*3/uL (ref 0.0–0.1)
Immature Granulocytes: 0 %
Lymphocytes Absolute: 1.6 10*3/uL (ref 0.7–3.1)
Lymphs: 15 %
MCH: 30.4 pg (ref 26.6–33.0)
MCHC: 32.6 g/dL (ref 31.5–35.7)
MCV: 93 fL (ref 79–97)
Monocytes Absolute: 0.7 10*3/uL (ref 0.1–0.9)
Monocytes: 7 %
Neutrophils Absolute: 8.2 10*3/uL — ABNORMAL HIGH (ref 1.4–7.0)
Neutrophils: 73 %
Platelets: 277 10*3/uL (ref 150–450)
RBC: 3.75 x10E6/uL — ABNORMAL LOW (ref 3.77–5.28)
RDW: 11.8 % (ref 11.7–15.4)
WBC: 11.1 10*3/uL — ABNORMAL HIGH (ref 3.4–10.8)

## 2022-05-13 LAB — CMP14+EGFR
ALT: 12 IU/L (ref 0–32)
AST: 14 IU/L (ref 0–40)
Albumin/Globulin Ratio: 1.5 (ref 1.2–2.2)
Albumin: 4.4 g/dL (ref 3.8–4.8)
Alkaline Phosphatase: 98 IU/L (ref 44–121)
BUN/Creatinine Ratio: 17 (ref 12–28)
BUN: 25 mg/dL (ref 8–27)
Bilirubin Total: 0.3 mg/dL (ref 0.0–1.2)
CO2: 19 mmol/L — ABNORMAL LOW (ref 20–29)
Calcium: 9.9 mg/dL (ref 8.7–10.3)
Chloride: 102 mmol/L (ref 96–106)
Creatinine, Ser: 1.48 mg/dL — ABNORMAL HIGH (ref 0.57–1.00)
Globulin, Total: 3 g/dL (ref 1.5–4.5)
Glucose: 261 mg/dL — ABNORMAL HIGH (ref 70–99)
Potassium: 4.5 mmol/L (ref 3.5–5.2)
Sodium: 139 mmol/L (ref 134–144)
Total Protein: 7.4 g/dL (ref 6.0–8.5)
eGFR: 36 mL/min/{1.73_m2} — ABNORMAL LOW (ref >59)

## 2022-05-13 LAB — THYROID PANEL WITH TSH

## 2022-05-13 LAB — LIPID PANEL

## 2022-05-13 LAB — VITAMIN B12: Vitamin B-12: 392 pg/mL (ref 232–1245)

## 2022-05-14 LAB — LIPID PANEL
HDL: 49 mg/dL (ref >39)
LDL Chol Calc (NIH): 47 mg/dL (ref 0–99)
VLDL Cholesterol Cal: 27 mg/dL (ref 5–40)

## 2022-05-14 LAB — SPECIMEN STATUS REPORT

## 2022-05-14 LAB — VITAMIN D 25 HYDROXY (VIT D DEFICIENCY, FRACTURES): Vit D, 25-Hydroxy: 45.5 ng/mL (ref 30.0–100.0)

## 2022-05-14 LAB — THYROID PANEL WITH TSH: T3 Uptake Ratio: 31 % (ref 24–39)

## 2022-06-03 DIAGNOSIS — H31093 Other chorioretinal scars, bilateral: Secondary | ICD-10-CM | POA: Diagnosis not present

## 2022-06-03 DIAGNOSIS — H35372 Puckering of macula, left eye: Secondary | ICD-10-CM | POA: Diagnosis not present

## 2022-06-03 DIAGNOSIS — E113513 Type 2 diabetes mellitus with proliferative diabetic retinopathy with macular edema, bilateral: Secondary | ICD-10-CM | POA: Diagnosis not present

## 2022-07-01 ENCOUNTER — Other Ambulatory Visit: Payer: Self-pay | Admitting: Cardiology

## 2022-07-08 DIAGNOSIS — E113511 Type 2 diabetes mellitus with proliferative diabetic retinopathy with macular edema, right eye: Secondary | ICD-10-CM | POA: Diagnosis not present

## 2022-08-12 ENCOUNTER — Ambulatory Visit: Payer: Medicare HMO | Admitting: Family Medicine

## 2022-08-20 DIAGNOSIS — E113512 Type 2 diabetes mellitus with proliferative diabetic retinopathy with macular edema, left eye: Secondary | ICD-10-CM | POA: Diagnosis not present

## 2022-08-28 ENCOUNTER — Other Ambulatory Visit: Payer: Self-pay | Admitting: Cardiology

## 2022-09-11 ENCOUNTER — Other Ambulatory Visit: Payer: Self-pay | Admitting: Cardiology

## 2022-09-17 ENCOUNTER — Other Ambulatory Visit: Payer: Self-pay | Admitting: Family Medicine

## 2022-09-17 DIAGNOSIS — E1169 Type 2 diabetes mellitus with other specified complication: Secondary | ICD-10-CM

## 2022-09-17 DIAGNOSIS — E1122 Type 2 diabetes mellitus with diabetic chronic kidney disease: Secondary | ICD-10-CM

## 2022-10-06 ENCOUNTER — Telehealth: Payer: Self-pay | Admitting: Family Medicine

## 2022-10-06 MED ORDER — NOVOLOG MIX 70/30 FLEXPEN (70-30) 100 UNIT/ML ~~LOC~~ SUPN: 18.0000 [IU] | PEN_INJECTOR | Freq: Two times a day (BID) | SUBCUTANEOUS | 0 refills | Status: AC

## 2022-10-06 MED ORDER — AMLODIPINE BESYLATE 5 MG PO TABS: 5.0000 mg | ORAL_TABLET | Freq: Every day | ORAL | 0 refills | Status: AC

## 2022-10-06 MED ORDER — INSULIN ASPART 100 UNIT/ML IJ SOLN: 14.0000 [IU] | Freq: Two times a day (BID) | INTRAMUSCULAR | 1 refills | Status: AC

## 2022-10-06 NOTE — Telephone Encounter (Signed)
  Prescription Request  10/06/2022  Is this a "Controlled Substance" medicine? no  Have you seen your PCP in the last 2 weeks? No pt has appt in October with Marcelino Duster  If YES, route message to pool  -  If NO, patient needs to be scheduled for appointment.  What is the name of the medication or equipment? amLODipine (NORVASC) 5 MG tablet  insulin aspart protamine - aspart (NOVOLOG MIX 70/30 FLEXPEN) (70-30) 100 UNIT/ML FlexPen  ferrous sulfate (FEROSUL) 325 (65 FE) MG tablet  Insulin Pen Needle (GNP ULTICARE PEN NEEDLES) 32G X 4 MM MISC   Have you contacted your pharmacy to request a refill? No pt is switching to another pharmacy   Which pharmacy would you like this sent to? [facility] [place] Pt would like pharmacy [facility]   Patient notified that their request is being sent to the clinical staff for review and that they should receive a response within 2 business days.

## 2022-10-06 NOTE — Telephone Encounter (Signed)
Amlodipine last sent by Dr. Gayland Curry- please advise. Medication pended

## 2022-10-07 NOTE — Telephone Encounter (Signed)
Spoke with pt - this has been fixed

## 2022-11-10 DIAGNOSIS — D508 Other iron deficiency anemias: Secondary | ICD-10-CM | POA: Diagnosis not present

## 2022-11-10 DIAGNOSIS — E785 Hyperlipidemia, unspecified: Secondary | ICD-10-CM | POA: Diagnosis not present

## 2022-11-10 DIAGNOSIS — I10 Essential (primary) hypertension: Secondary | ICD-10-CM | POA: Diagnosis not present

## 2022-11-10 DIAGNOSIS — E039 Hypothyroidism, unspecified: Secondary | ICD-10-CM | POA: Diagnosis not present

## 2022-11-10 DIAGNOSIS — H547 Unspecified visual loss: Secondary | ICD-10-CM | POA: Diagnosis not present

## 2022-11-10 DIAGNOSIS — R42 Dizziness and giddiness: Secondary | ICD-10-CM | POA: Diagnosis not present

## 2022-11-10 DIAGNOSIS — I4949 Other premature depolarization: Secondary | ICD-10-CM | POA: Diagnosis not present

## 2022-11-10 DIAGNOSIS — I2510 Atherosclerotic heart disease of native coronary artery without angina pectoris: Secondary | ICD-10-CM | POA: Diagnosis not present

## 2022-11-10 DIAGNOSIS — E119 Type 2 diabetes mellitus without complications: Secondary | ICD-10-CM | POA: Diagnosis not present

## 2022-11-11 DIAGNOSIS — E559 Vitamin D deficiency, unspecified: Secondary | ICD-10-CM | POA: Diagnosis not present

## 2022-11-11 DIAGNOSIS — R079 Chest pain, unspecified: Secondary | ICD-10-CM | POA: Diagnosis not present

## 2022-11-11 DIAGNOSIS — I10 Essential (primary) hypertension: Secondary | ICD-10-CM | POA: Diagnosis not present

## 2022-11-11 DIAGNOSIS — E119 Type 2 diabetes mellitus without complications: Secondary | ICD-10-CM | POA: Diagnosis not present

## 2022-11-16 ENCOUNTER — Encounter: Payer: Medicare HMO | Admitting: Family Medicine

## 2022-12-03 DIAGNOSIS — Z961 Presence of intraocular lens: Secondary | ICD-10-CM | POA: Diagnosis not present

## 2022-12-03 DIAGNOSIS — Z794 Long term (current) use of insulin: Secondary | ICD-10-CM | POA: Diagnosis not present

## 2022-12-03 DIAGNOSIS — E11311 Type 2 diabetes mellitus with unspecified diabetic retinopathy with macular edema: Secondary | ICD-10-CM | POA: Diagnosis not present

## 2022-12-14 DIAGNOSIS — E785 Hyperlipidemia, unspecified: Secondary | ICD-10-CM | POA: Diagnosis not present

## 2022-12-14 DIAGNOSIS — I2510 Atherosclerotic heart disease of native coronary artery without angina pectoris: Secondary | ICD-10-CM | POA: Diagnosis not present

## 2022-12-14 DIAGNOSIS — I10 Essential (primary) hypertension: Secondary | ICD-10-CM | POA: Diagnosis not present

## 2022-12-27 DIAGNOSIS — H31093 Other chorioretinal scars, bilateral: Secondary | ICD-10-CM | POA: Diagnosis not present

## 2022-12-27 DIAGNOSIS — H35373 Puckering of macula, bilateral: Secondary | ICD-10-CM | POA: Diagnosis not present

## 2022-12-27 DIAGNOSIS — E113513 Type 2 diabetes mellitus with proliferative diabetic retinopathy with macular edema, bilateral: Secondary | ICD-10-CM | POA: Diagnosis not present

## 2023-01-04 DIAGNOSIS — I25810 Atherosclerosis of coronary artery bypass graft(s) without angina pectoris: Secondary | ICD-10-CM | POA: Diagnosis not present

## 2023-01-04 DIAGNOSIS — E782 Mixed hyperlipidemia: Secondary | ICD-10-CM | POA: Diagnosis not present

## 2023-01-04 DIAGNOSIS — E1169 Type 2 diabetes mellitus with other specified complication: Secondary | ICD-10-CM | POA: Diagnosis not present

## 2023-01-04 DIAGNOSIS — I10 Essential (primary) hypertension: Secondary | ICD-10-CM | POA: Diagnosis not present

## 2023-01-04 DIAGNOSIS — N1831 Chronic kidney disease, stage 3a: Secondary | ICD-10-CM | POA: Diagnosis not present

## 2023-01-04 DIAGNOSIS — Z1330 Encounter for screening examination for mental health and behavioral disorders, unspecified: Secondary | ICD-10-CM | POA: Diagnosis not present

## 2023-01-07 DIAGNOSIS — I2510 Atherosclerotic heart disease of native coronary artery without angina pectoris: Secondary | ICD-10-CM | POA: Diagnosis not present

## 2023-01-07 DIAGNOSIS — E785 Hyperlipidemia, unspecified: Secondary | ICD-10-CM | POA: Diagnosis not present

## 2023-01-07 DIAGNOSIS — I10 Essential (primary) hypertension: Secondary | ICD-10-CM | POA: Diagnosis not present

## 2023-01-19 DIAGNOSIS — I25810 Atherosclerosis of coronary artery bypass graft(s) without angina pectoris: Secondary | ICD-10-CM | POA: Diagnosis not present

## 2023-02-14 DIAGNOSIS — I2510 Atherosclerotic heart disease of native coronary artery without angina pectoris: Secondary | ICD-10-CM | POA: Diagnosis not present

## 2023-02-14 DIAGNOSIS — I10 Essential (primary) hypertension: Secondary | ICD-10-CM | POA: Diagnosis not present

## 2023-02-14 DIAGNOSIS — E785 Hyperlipidemia, unspecified: Secondary | ICD-10-CM | POA: Diagnosis not present

## 2023-02-16 DIAGNOSIS — E119 Type 2 diabetes mellitus without complications: Secondary | ICD-10-CM | POA: Diagnosis not present

## 2023-02-16 DIAGNOSIS — I10 Essential (primary) hypertension: Secondary | ICD-10-CM | POA: Diagnosis not present

## 2023-02-28 DIAGNOSIS — Z961 Presence of intraocular lens: Secondary | ICD-10-CM | POA: Diagnosis not present

## 2023-02-28 DIAGNOSIS — E113513 Type 2 diabetes mellitus with proliferative diabetic retinopathy with macular edema, bilateral: Secondary | ICD-10-CM | POA: Diagnosis not present

## 2023-02-28 DIAGNOSIS — H31093 Other chorioretinal scars, bilateral: Secondary | ICD-10-CM | POA: Diagnosis not present

## 2023-02-28 DIAGNOSIS — H35373 Puckering of macula, bilateral: Secondary | ICD-10-CM | POA: Diagnosis not present

## 2023-03-04 DIAGNOSIS — R059 Cough, unspecified: Secondary | ICD-10-CM | POA: Diagnosis not present

## 2023-03-04 DIAGNOSIS — J101 Influenza due to other identified influenza virus with other respiratory manifestations: Secondary | ICD-10-CM | POA: Diagnosis not present

## 2023-03-09 DIAGNOSIS — L814 Other melanin hyperpigmentation: Secondary | ICD-10-CM | POA: Diagnosis not present

## 2023-03-09 DIAGNOSIS — D225 Melanocytic nevi of trunk: Secondary | ICD-10-CM | POA: Diagnosis not present

## 2023-03-09 DIAGNOSIS — L821 Other seborrheic keratosis: Secondary | ICD-10-CM | POA: Diagnosis not present

## 2023-03-09 DIAGNOSIS — Z129 Encounter for screening for malignant neoplasm, site unspecified: Secondary | ICD-10-CM | POA: Diagnosis not present

## 2023-03-16 DIAGNOSIS — I10 Essential (primary) hypertension: Secondary | ICD-10-CM | POA: Diagnosis not present

## 2023-03-16 DIAGNOSIS — E785 Hyperlipidemia, unspecified: Secondary | ICD-10-CM | POA: Diagnosis not present

## 2023-03-16 DIAGNOSIS — I2510 Atherosclerotic heart disease of native coronary artery without angina pectoris: Secondary | ICD-10-CM | POA: Diagnosis not present

## 2023-04-04 ENCOUNTER — Other Ambulatory Visit: Payer: Self-pay | Admitting: Family Medicine
